# Patient Record
Sex: Male | Born: 1975 | Race: White | Hispanic: No | Marital: Married | Smoking: Former smoker
Health system: Southern US, Community
[De-identification: ages and names within clinical notes are randomized; demographics above are authoritative.]

## PROBLEM LIST (undated history)

## (undated) DIAGNOSIS — E11628 Type 2 diabetes mellitus with other skin complications: Secondary | ICD-10-CM

## (undated) DIAGNOSIS — A419 Sepsis, unspecified organism: Secondary | ICD-10-CM

## (undated) DIAGNOSIS — M869 Osteomyelitis, unspecified: Secondary | ICD-10-CM

## (undated) DIAGNOSIS — K2270 Barrett's esophagus without dysplasia: Secondary | ICD-10-CM

## (undated) DIAGNOSIS — F32A Depression, unspecified: Secondary | ICD-10-CM

## (undated) DIAGNOSIS — M1990 Unspecified osteoarthritis, unspecified site: Secondary | ICD-10-CM

## (undated) DIAGNOSIS — I8290 Acute embolism and thrombosis of unspecified vein: Secondary | ICD-10-CM

## (undated) DIAGNOSIS — N19 Unspecified kidney failure: Secondary | ICD-10-CM

## (undated) DIAGNOSIS — I10 Essential (primary) hypertension: Secondary | ICD-10-CM

## (undated) DIAGNOSIS — E119 Type 2 diabetes mellitus without complications: Secondary | ICD-10-CM

## (undated) DIAGNOSIS — I491 Atrial premature depolarization: Secondary | ICD-10-CM

## (undated) DIAGNOSIS — F419 Anxiety disorder, unspecified: Secondary | ICD-10-CM

## (undated) DIAGNOSIS — E11319 Type 2 diabetes mellitus with unspecified diabetic retinopathy without macular edema: Secondary | ICD-10-CM

## (undated) DIAGNOSIS — E118 Type 2 diabetes mellitus with unspecified complications: Secondary | ICD-10-CM

## (undated) DIAGNOSIS — F329 Major depressive disorder, single episode, unspecified: Secondary | ICD-10-CM

## (undated) DIAGNOSIS — M5020 Other cervical disc displacement, unspecified cervical region: Secondary | ICD-10-CM

## (undated) DIAGNOSIS — K219 Gastro-esophageal reflux disease without esophagitis: Secondary | ICD-10-CM

## (undated) DIAGNOSIS — L089 Local infection of the skin and subcutaneous tissue, unspecified: Secondary | ICD-10-CM

## (undated) DIAGNOSIS — I493 Ventricular premature depolarization: Secondary | ICD-10-CM

## (undated) DIAGNOSIS — G629 Polyneuropathy, unspecified: Secondary | ICD-10-CM

## (undated) DIAGNOSIS — M86672 Other chronic osteomyelitis, left ankle and foot: Principal | ICD-10-CM

## (undated) DIAGNOSIS — Z8719 Personal history of other diseases of the digestive system: Secondary | ICD-10-CM

## (undated) DIAGNOSIS — G4730 Sleep apnea, unspecified: Secondary | ICD-10-CM

## (undated) HISTORY — PX: DEBRIDEMENT LEG: SUR390

## (undated) HISTORY — PX: VITRECTOMY: SHX106

## (undated) HISTORY — DX: Other chronic osteomyelitis, left ankle and foot: M86.672

## (undated) HISTORY — PX: WRIST ARTHROSCOPY: SUR100

## (undated) HISTORY — PX: TOE AMPUTATION: SHX809

## (undated) HISTORY — DX: Type 2 diabetes mellitus with unspecified complications: E11.8

---

## 2013-01-24 ENCOUNTER — Other Ambulatory Visit: Payer: Self-pay | Admitting: Podiatry

## 2013-01-29 ENCOUNTER — Encounter (HOSPITAL_COMMUNITY): Payer: Self-pay | Admitting: Pharmacy Technician

## 2013-02-06 ENCOUNTER — Encounter (HOSPITAL_COMMUNITY)
Admission: RE | Admit: 2013-02-06 | Discharge: 2013-02-06 | Disposition: A | Discharge status: Home / Self Care | Payer: Medicare Other | Source: Ambulatory Visit | Attending: Podiatry | Admitting: Podiatry

## 2013-02-06 ENCOUNTER — Ambulatory Visit (HOSPITAL_COMMUNITY)
Admission: RE | Admit: 2013-02-06 | Discharge: 2013-02-06 | Disposition: A | Discharge status: Home / Self Care | Payer: Medicare Other | Source: Ambulatory Visit | Attending: Podiatry | Admitting: Podiatry

## 2013-02-06 ENCOUNTER — Other Ambulatory Visit: Payer: Self-pay

## 2013-02-06 ENCOUNTER — Encounter (HOSPITAL_COMMUNITY): Payer: Self-pay

## 2013-02-06 HISTORY — DX: Polyneuropathy, unspecified: G62.9

## 2013-02-06 HISTORY — DX: Anxiety disorder, unspecified: F41.9

## 2013-02-06 HISTORY — DX: Barrett's esophagus without dysplasia: K22.70

## 2013-02-06 HISTORY — DX: Unspecified osteoarthritis, unspecified site: M19.90

## 2013-02-06 HISTORY — DX: Personal history of other diseases of the digestive system: Z87.19

## 2013-02-06 HISTORY — DX: Other cervical disc displacement, unspecified cervical region: M50.20

## 2013-02-06 HISTORY — DX: Major depressive disorder, single episode, unspecified: F32.9

## 2013-02-06 HISTORY — DX: Depression, unspecified: F32.A

## 2013-02-06 HISTORY — DX: Type 2 diabetes mellitus without complications: E11.9

## 2013-02-06 HISTORY — DX: Gastro-esophageal reflux disease without esophagitis: K21.9

## 2013-02-06 HISTORY — DX: Atrial premature depolarization: I49.1

## 2013-02-06 HISTORY — DX: Acute embolism and thrombosis of unspecified vein: I82.90

## 2013-02-06 HISTORY — DX: Ventricular premature depolarization: I49.3

## 2013-02-06 LAB — BASIC METABOLIC PANEL
BUN: 19 mg/dL (ref 6–23)
CO2: 31 mEq/L (ref 19–32)
Calcium: 9.3 mg/dL (ref 8.4–10.5)
Chloride: 94 mEq/L — ABNORMAL LOW (ref 96–112)
Creatinine, Ser: 1.72 mg/dL — ABNORMAL HIGH (ref 0.50–1.35)
Glucose, Bld: 336 mg/dL — ABNORMAL HIGH (ref 70–99)

## 2013-02-06 LAB — SURGICAL PCR SCREEN: MRSA, PCR: NEGATIVE

## 2013-02-06 NOTE — Patient Instructions (Addendum)
    Maurice [name]    02/06/2013   Your procedure is scheduled on:  02/14/13  Report to [facility] at  615 AM.  Call this number if you have problems the morning of surgery: [phone]   Remember:   Do not eat food or drink liquids after midnight.   Take these medicines the morning of surgery with A SIP OF WATER: metoprolol,omeprazile,lisinopril,xanax. Take 1/2 of lantus night before surgery.   Do not wear jewelry, make-up or nail polish.  Do not wear lotions, powders, or perfumes.   Do not shave 48 hours prior to surgery. Men may shave face and neck.  Do not bring valuables to the hospital.  Contacts, dentures or bridgework may not be worn into surgery.  Leave suitcase in the car. After surgery it may be brought to your room.  For patients admitted to the hospital, checkout time is 11:00 AM the day of discharge.   Patients discharged the day of surgery will not be allowed to drive  home.  Name and phone number of your driver: family  Special Instructions: Shower using CHG 2 nights before surgery and the night before surgery.  If you shower the day of surgery use CHG.  Use special wash - you have one bottle of CHG for all showers.  You should use approximately 1/3 of the bottle for each shower.   Please read over the following fact sheets that you were given: Pain Booklet, Coughing and Deep Breathing, MRSA Information, Surgical Site Infection Prevention, Anesthesia Post-op Instructions and Care and Recovery After Surgery PATIENT INSTRUCTIONS POST-ANESTHESIA  IMMEDIATELY FOLLOWING SURGERY:  Do not drive or operate machinery for the first twenty four hours after surgery.  Do not make any important decisions for twenty four hours after surgery or while taking narcotic pain medications or sedatives.  If you develop intractable nausea and vomiting or a severe headache please notify your doctor immediately.  FOLLOW-UP:  Please make an appointment with your surgeon as instructed. You do not need to  follow up with anesthesia unless specifically instructed to do so.  WOUND CARE INSTRUCTIONS (if applicable):  Keep a dry clean dressing on the anesthesia/puncture wound site if there is drainage.  Once the wound has quit draining you may leave it open to air.  Generally you should leave the bandage intact for twenty four hours unless there is drainage.  If the epidural site drains for more than 36-48 hours please call the anesthesia department.  QUESTIONS?:  Please feel free to call your physician or the hospital operator if you have any questions, and they will be happy to assist you.

## 2013-02-07 NOTE — Pre-Procedure Instructions (Signed)
Dr Jayme Cloud aware of glucose. Accu check on arrival to be done,no further orders.

## 2013-02-14 ENCOUNTER — Ambulatory Visit (HOSPITAL_COMMUNITY): Payer: Medicare Other | Admitting: Anesthesiology

## 2013-02-14 ENCOUNTER — Ambulatory Visit (HOSPITAL_COMMUNITY)
Admission: RE | Admit: 2013-02-14 | Discharge: 2013-02-14 | Disposition: A | Discharge status: Home / Self Care | Payer: Medicare Other | Source: Ambulatory Visit | Attending: Podiatry | Admitting: Podiatry

## 2013-02-14 ENCOUNTER — Encounter (HOSPITAL_COMMUNITY): Payer: Self-pay | Admitting: Anesthesiology

## 2013-02-14 ENCOUNTER — Ambulatory Visit (HOSPITAL_COMMUNITY): Payer: Medicare Other

## 2013-02-14 ENCOUNTER — Encounter (HOSPITAL_COMMUNITY)
Admission: RE | Disposition: A | Discharge status: Home / Self Care | Payer: Self-pay | Source: Ambulatory Visit | Attending: Podiatry

## 2013-02-14 ENCOUNTER — Encounter (HOSPITAL_COMMUNITY): Payer: Self-pay | Admitting: *Deleted

## 2013-02-14 DIAGNOSIS — E1149 Type 2 diabetes mellitus with other diabetic neurological complication: Secondary | ICD-10-CM | POA: Insufficient documentation

## 2013-02-14 DIAGNOSIS — M19079 Primary osteoarthritis, unspecified ankle and foot: Secondary | ICD-10-CM | POA: Insufficient documentation

## 2013-02-14 DIAGNOSIS — L97509 Non-pressure chronic ulcer of other part of unspecified foot with unspecified severity: Secondary | ICD-10-CM | POA: Insufficient documentation

## 2013-02-14 DIAGNOSIS — E1142 Type 2 diabetes mellitus with diabetic polyneuropathy: Secondary | ICD-10-CM | POA: Insufficient documentation

## 2013-02-14 HISTORY — PX: METATARSAL HEAD EXCISION: SHX5027

## 2013-02-14 LAB — GLUCOSE, CAPILLARY: Glucose-Capillary: 259 mg/dL — ABNORMAL HIGH (ref 70–99)

## 2013-02-14 SURGERY — EXCISION, METATARSAL BONE, HEAD
Anesthesia: Monitor Anesthesia Care | Site: Foot | Laterality: Right | Wound class: Contaminated

## 2013-02-14 MED ORDER — FENTANYL CITRATE 0.05 MG/ML IJ SOLN
INTRAMUSCULAR | Status: DC | PRN
  Administered 2013-02-14 (×3): 25 ug via INTRAVENOUS

## 2013-02-14 MED ORDER — CLINDAMYCIN PHOSPHATE 600 MG/50ML IV SOLN
INTRAVENOUS | Status: AC
  Filled 2013-02-14: qty 50

## 2013-02-14 MED ORDER — FENTANYL CITRATE 0.05 MG/ML IJ SOLN
INTRAMUSCULAR | Status: AC
  Filled 2013-02-14: qty 2

## 2013-02-14 MED ORDER — ONDANSETRON HCL 4 MG/2ML IJ SOLN
INTRAMUSCULAR | Status: AC
  Filled 2013-02-14: qty 2

## 2013-02-14 MED ORDER — FENTANYL CITRATE 0.05 MG/ML IJ SOLN
25.0000 ug | INTRAMUSCULAR | Status: DC | PRN
  Administered 2013-02-14 (×4): 50 ug via INTRAVENOUS

## 2013-02-14 MED ORDER — BUPIVACAINE HCL (PF) 0.5 % IJ SOLN
INTRAMUSCULAR | Status: AC
  Filled 2013-02-14: qty 30

## 2013-02-14 MED ORDER — MIDAZOLAM HCL 2 MG/2ML IJ SOLN
1.0000 mg | INTRAMUSCULAR | Status: DC | PRN
  Administered 2013-02-14 (×2): 2 mg via INTRAVENOUS

## 2013-02-14 MED ORDER — BUPIVACAINE HCL (PF) 0.5 % IJ SOLN
INTRAMUSCULAR | Status: DC | PRN
  Administered 2013-02-14: 17 mL

## 2013-02-14 MED ORDER — LACTATED RINGERS IV SOLN
INTRAVENOUS | Status: DC
  Administered 2013-02-14: 07:00:00 via INTRAVENOUS

## 2013-02-14 MED ORDER — PROPOFOL INFUSION 10 MG/ML OPTIME
INTRAVENOUS | Status: DC | PRN
  Administered 2013-02-14: 08:00:00 via INTRAVENOUS
  Administered 2013-02-14: 125 ug/kg/min via INTRAVENOUS

## 2013-02-14 MED ORDER — MIDAZOLAM HCL 2 MG/2ML IJ SOLN
INTRAMUSCULAR | Status: AC
  Filled 2013-02-14: qty 2

## 2013-02-14 MED ORDER — EPHEDRINE SULFATE 50 MG/ML IJ SOLN
INTRAMUSCULAR | Status: AC
  Filled 2013-02-14: qty 1

## 2013-02-14 MED ORDER — PROPOFOL 10 MG/ML IV EMUL
INTRAVENOUS | Status: AC
  Filled 2013-02-14: qty 20

## 2013-02-14 MED ORDER — ONDANSETRON HCL 4 MG/2ML IJ SOLN: 4.0000 mg | Freq: Once | INTRAMUSCULAR | Status: DC | PRN

## 2013-02-14 MED ORDER — LIDOCAINE HCL (PF) 1 % IJ SOLN
INTRAMUSCULAR | Status: AC
  Filled 2013-02-14: qty 5

## 2013-02-14 MED ORDER — CLINDAMYCIN PHOSPHATE 600 MG/50ML IV SOLN
600.0000 mg | Freq: Once | INTRAVENOUS | Status: AC
  Administered 2013-02-14: 600 mg via INTRAVENOUS

## 2013-02-14 MED ORDER — FENTANYL CITRATE 0.05 MG/ML IJ SOLN
25.0000 ug | INTRAMUSCULAR | Status: DC | PRN
  Administered 2013-02-14: 25 ug via INTRAVENOUS

## 2013-02-14 MED ORDER — 0.9 % SODIUM CHLORIDE (POUR BTL) OPTIME
TOPICAL | Status: DC | PRN
  Administered 2013-02-14: 1000 mL

## 2013-02-14 MED ORDER — MIDAZOLAM HCL 5 MG/5ML IJ SOLN
INTRAMUSCULAR | Status: DC | PRN
  Administered 2013-02-14: 1 mg via INTRAVENOUS

## 2013-02-14 SURGICAL SUPPLY — 52 items
BAG HAMPER (MISCELLANEOUS) ×2 IMPLANT
BANDAGE ELASTIC 4 VELCRO NS (GAUZE/BANDAGES/DRESSINGS) ×2 IMPLANT
BANDAGE ESMARK 4X12 BL STRL LF (DISPOSABLE) ×1 IMPLANT
BANDAGE GAUZE ELAST BULKY 4 IN (GAUZE/BANDAGES/DRESSINGS) ×2 IMPLANT
BENZOIN TINCTURE PRP APPL 2/3 (GAUZE/BANDAGES/DRESSINGS) ×2 IMPLANT
BLADE AVERAGE 25X9 (BLADE) ×4 IMPLANT
BLADE OSC/SAGITTAL MD 9X18.5 (BLADE) IMPLANT
BLADE SURG 15 STRL LF DISP TIS (BLADE) ×3 IMPLANT
BLADE SURG 15 STRL SS (BLADE) ×3
BNDG ESMARK 4X12 BLUE STRL LF (DISPOSABLE) ×2
CHLORAPREP W/TINT 26ML (MISCELLANEOUS) ×2 IMPLANT
CLOTH BEACON ORANGE TIMEOUT ST (SAFETY) ×2 IMPLANT
COVER LIGHT HANDLE STERIS (MISCELLANEOUS) ×4 IMPLANT
CUFF TOURN SGL LL 12 (TOURNIQUET CUFF) IMPLANT
CUFF TOURNIQUET SINGLE 18IN (TOURNIQUET CUFF) ×2 IMPLANT
DECANTER SPIKE VIAL GLASS SM (MISCELLANEOUS) ×2 IMPLANT
DERMAGRAFT 2X3 (Mesh General) ×2 IMPLANT
DRAPE OEC MINIVIEW 54X84 (DRAPES) ×2 IMPLANT
DRSG ADAPTIC 3X8 NADH LF (GAUZE/BANDAGES/DRESSINGS) ×2 IMPLANT
DURA STEPPER LG (CAST SUPPLIES) IMPLANT
DURA STEPPER MED (CAST SUPPLIES) IMPLANT
DURA STEPPER SML (CAST SUPPLIES) IMPLANT
DURA STEPPER XL (SOFTGOODS) IMPLANT
ELECT REM PT RETURN 9FT ADLT (ELECTROSURGICAL) ×2
ELECTRODE REM PT RTRN 9FT ADLT (ELECTROSURGICAL) ×1 IMPLANT
GLOVE BIO SURGEON STRL SZ7.5 (GLOVE) ×2 IMPLANT
GLOVE BIOGEL PI IND STRL 7.0 (GLOVE) ×2 IMPLANT
GLOVE BIOGEL PI INDICATOR 7.0 (GLOVE) ×2
GLOVE ECLIPSE 6.5 STRL STRAW (GLOVE) ×4 IMPLANT
GLOVE EXAM NITRILE MD LF STRL (GLOVE) ×4 IMPLANT
GOWN STRL REIN XL XLG (GOWN DISPOSABLE) ×6 IMPLANT
K-WIRE 229MX1.6 (WIRE) IMPLANT
K-WIRE 6 (WIRE)
KIT ROOM TURNOVER APOR (KITS) ×2 IMPLANT
KWIRE 6 (WIRE) IMPLANT
MANIFOLD NEPTUNE II (INSTRUMENTS) ×2 IMPLANT
MARKER SKIN DUAL TIP RULER LAB (MISCELLANEOUS) ×2 IMPLANT
NEEDLE HYPO 27GX1-1/4 (NEEDLE) ×8 IMPLANT
NS IRRIG 1000ML POUR BTL (IV SOLUTION) ×2 IMPLANT
PACK BASIC LIMB (CUSTOM PROCEDURE TRAY) ×2 IMPLANT
PAD ARMBOARD 7.5X6 YLW CONV (MISCELLANEOUS) ×2 IMPLANT
RASP SM TEAR CROSS CUT (RASP) ×2 IMPLANT
SET BASIN LINEN APH (SET/KITS/TRAYS/PACK) ×2 IMPLANT
SPONGE GAUZE 4X4 12PLY (GAUZE/BANDAGES/DRESSINGS) ×2 IMPLANT
SPONGE LAP 18X18 X RAY DECT (DISPOSABLE) ×2 IMPLANT
STRIP CLOSURE SKIN 1/2X4 (GAUZE/BANDAGES/DRESSINGS) ×2 IMPLANT
SUT BONE WAX W31G (SUTURE) IMPLANT
SUT MON AB 5-0 PS2 18 (SUTURE) IMPLANT
SUT PROLENE 4 0 PS 2 18 (SUTURE) ×2 IMPLANT
SUT VIC AB 4-0 PS2 27 (SUTURE) ×2 IMPLANT
SUT VICRYL AB 3-0 FS1 BRD 27IN (SUTURE) ×2 IMPLANT
SYR CONTROL 10ML LL (SYRINGE) ×4 IMPLANT

## 2013-02-14 NOTE — H&P (Signed)
HISTORY AND PHYSICAL INTERVAL NOTE:  02/14/2013  7:23 AM  Maurice [name]  has presented today for surgery, with the diagnosis of ulceration right foot, dm with peripheral neuropathy .  The various methods of treatment have been discussed with the patient.  No guarantees were given.  After consideration of risks, benefits and other options for treatment, the patient has consented to surgery.  I have reviewed the patients' chart and labs.    Patient Vitals for the past 24 hrs:  BP Temp Temp src Pulse Resp SpO2 Height Weight  02/14/13 0654 125/94 mmHg 98.6 F (37 C) Oral 86 20 98 % 6\' 3"  (1.905 m) 102.967 kg (227 lb)    A history and physical examination was performed in my office.  The patient was reexamined.  There have been no changes to this history and physical examination.  Dallas Schimke, DPM

## 2013-02-14 NOTE — Anesthesia Preprocedure Evaluation (Signed)
Anesthesia Evaluation  Patient identified by MRN, date of birth, ID band Patient awake    Reviewed: Allergy & Precautions, H&P , NPO status , Patient's Chart, lab work & pertinent test results  Airway Mallampati: I TM Distance: >3 FB     Dental  (+) Teeth Intact   Pulmonary neg pulmonary ROS,  breath sounds clear to auscultation        Cardiovascular hypertension, Pt. on medications + dysrhythmias Rhythm:Regular Rate:Normal     Neuro/Psych PSYCHIATRIC DISORDERS Anxiety Depression    GI/Hepatic hiatal hernia, GERD-  ,  Endo/Other  diabetes, Poorly Controlled, Insulin Dependent  Renal/GU      Musculoskeletal   Abdominal   Peds  Hematology   Anesthesia Other Findings   Reproductive/Obstetrics                           Anesthesia Physical Anesthesia Plan  ASA: III  Anesthesia Plan: MAC   Post-op Pain Management:    Induction: Intravenous  Airway Management Planned: Nasal Cannula  Additional Equipment:   Intra-op Plan:   Post-operative Plan:   Informed Consent: I have reviewed the patients History and Physical, chart, labs and discussed the procedure including the risks, benefits and alternatives for the proposed anesthesia with the patient or authorized representative who has indicated his/her understanding and acceptance.     Plan Discussed with:   Anesthesia Plan Comments:         Anesthesia Quick Evaluation

## 2013-02-14 NOTE — Op Note (Signed)
OPERATIVE NOTE  DATE OF PROCEDURE:  02/14/2013  SURGEON:   Dallas Schimke, DPM  OR STAFF:   Circulator: Cyndie Chime, RN Scrub Person: Sherri Sear, CST Circulator Assistant: Lizabeth Leyden, RN RN First Assistant: Eliane Decree Page, RN   PREOPERATIVE DIAGNOSIS:   1.  Ulceration right foot 2.  Diabetes with peripheral neuropathy   POSTOPERATIVE DIAGNOSIS: Same  PROCEDURE: 1.  4th metatarsal head resection right foot 2.  Excisional full thickness debridement of ulceration right foot 3.  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  DESCRIPTION OF THE PROCEDURE:   The patient was brought to the operating room and placed on the operative table in the supine position.  A pneumatic ankle tourniquet was applied to the patient's ankle.  Following sedation, the surgical site was anesthetized with 0.5% Marcaine plain.  The foot was then prepped, scrubbed, and draped in the usual sterile technique.  The foot was elevated, exsanguinated and the pneumatic ankle tourniquet inflated to 250 mmHg.    Attention was directed to the dorsal aspect of the right foot overlying the fourth metatarsal phalangeal joint.  A linear longitudinal incision was made overlying the fourth metatarsal head.  Dissection was continued deep down to the level of the fourth metatarsophalangeal joint.  A linear longitudinal periosteal and capsular incision was performed.  A power saw was used to perform an osteotomy proximal to the surgical neck of the fourth metatarsal bone.   The head of the fourth metatarsal bone was removed and passed from the operative field.  No visible erosive changes were present.  The bone was sent to pathology for evaluation.  The wound was irrigated with copious amounts of sterile irrigant.  The periosteal and capsular structures reapproximated using 3-0 Vicryl.  The subcutaneous structures were reapproximated using 4-0 Vicryl.  The skin was reapproximated using 4-0 Prolene in a horizontal mattress technique.  Attention was directed to the plantar aspect of the fourth metatarsal head region.  A full-thickness ulceration present along the plantar aspect of the right foot.  Surrounding hyperkeratotic tissue was present.  Using a #15 blade an excisional full-thickness debridement of the ulceration of the right foot was performed.  Hyperkeratotic tissue and fibrous tissue from the wound bed was removed.  Following debridement, the ulceration measured 0.9 cm in length by 0.5 cm in width.  Dermagraft was then prepared following packet instructions.  Approximately 50% of the Dermagraft was used.  It was secured with Steri-Strips.  The remaining graft was discarded.  A sterile dressing was then applied to the right foot.  The pneumatic ankle tourniquet was deflated and a prompt hyperemic response was noted to all digits of the operative foot.  The patient tolerated the procedure well.  The patient was then transferred to PACU with vital signs stable and vascular status intact to all toes of the operative foot.  Following a period of postoperative monitoring, the patient will be discharged home.

## 2013-02-14 NOTE — Transfer of Care (Signed)
Immediate Anesthesia Transfer of Care Note  Patient: Maurice [name]  Procedure(s) Performed: Procedure(s) with comments: 4TH METATARSAL HEAD RESECTION RIGHT FOOT (Right) - latex allergy APPLICATION DERMA GRAFT RIGHT FOOT (Right)  Patient Location: PACU  Anesthesia Type:MAC  Level of Consciousness: awake, alert  and oriented  Airway & Oxygen Therapy: Patient Spontanous Breathing  Post-op Assessment: Report given to PACU RN  Post vital signs: Reviewed and stable  Complications: No apparent anesthesia complications

## 2013-02-14 NOTE — Anesthesia Postprocedure Evaluation (Signed)
  Anesthesia Post-op Note  Patient: Maurice [name]  Procedure(s) Performed: Procedure(s) with comments: 4TH METATARSAL HEAD RESECTION RIGHT FOOT (Right) - latex allergy APPLICATION DERMA GRAFT RIGHT FOOT (Right)  Patient Location: PACU  Anesthesia Type:MAC  Level of Consciousness: awake, alert  and oriented  Airway and Oxygen Therapy: Patient Spontanous Breathing  Post-op Pain: none  Post-op Assessment: Post-op Vital signs reviewed, Patient's Cardiovascular Status Stable, Respiratory Function Stable, Patent Airway and No signs of Nausea or vomiting  Post-op Vital Signs: Reviewed and stable  Complications: No apparent anesthesia complications

## 2013-02-18 ENCOUNTER — Encounter (HOSPITAL_COMMUNITY): Payer: Self-pay | Admitting: Podiatry

## 2015-06-15 ENCOUNTER — Encounter (HOSPITAL_COMMUNITY): Payer: Self-pay

## 2015-06-15 ENCOUNTER — Other Ambulatory Visit: Payer: Self-pay | Admitting: Podiatry

## 2015-06-15 ENCOUNTER — Ambulatory Visit (HOSPITAL_COMMUNITY)
Admission: RE | Admit: 2015-06-15 | Discharge: 2015-06-15 | Disposition: A | Discharge status: Home / Self Care | Payer: Medicare Other | Source: Ambulatory Visit | Attending: Podiatry | Admitting: Podiatry

## 2015-06-15 ENCOUNTER — Other Ambulatory Visit: Payer: Self-pay

## 2015-06-15 ENCOUNTER — Encounter (HOSPITAL_COMMUNITY)
Admission: RE | Admit: 2015-06-15 | Discharge: 2015-06-15 | Disposition: A | Discharge status: Home / Self Care | Payer: Medicare Other | Source: Ambulatory Visit | Attending: Podiatry | Admitting: Podiatry

## 2015-06-15 DIAGNOSIS — L97509 Non-pressure chronic ulcer of other part of unspecified foot with unspecified severity: Principal | ICD-10-CM

## 2015-06-15 DIAGNOSIS — Z89421 Acquired absence of other right toe(s): Secondary | ICD-10-CM

## 2015-06-15 DIAGNOSIS — Z01812 Encounter for preprocedural laboratory examination: Secondary | ICD-10-CM

## 2015-06-15 DIAGNOSIS — E11621 Type 2 diabetes mellitus with foot ulcer: Secondary | ICD-10-CM | POA: Insufficient documentation

## 2015-06-15 DIAGNOSIS — A419 Sepsis, unspecified organism: Secondary | ICD-10-CM | POA: Diagnosis not present

## 2015-06-15 DIAGNOSIS — Z01818 Encounter for other preprocedural examination: Secondary | ICD-10-CM

## 2015-06-15 DIAGNOSIS — R609 Edema, unspecified: Secondary | ICD-10-CM | POA: Diagnosis not present

## 2015-06-15 HISTORY — DX: Type 2 diabetes mellitus with unspecified diabetic retinopathy without macular edema: E11.319

## 2015-06-15 HISTORY — DX: Unspecified kidney failure: N19

## 2015-06-15 HISTORY — DX: Sleep apnea, unspecified: G47.30

## 2015-06-15 HISTORY — DX: Essential (primary) hypertension: I10

## 2015-06-15 LAB — CBC WITH DIFFERENTIAL/PLATELET
BASOS ABS: 0.1 10*3/uL (ref 0.0–0.1)
Basophils Relative: 1 % (ref 0–1)
EOS PCT: 2 % (ref 0–5)
Eosinophils Absolute: 0.4 10*3/uL (ref 0.0–0.7)
HCT: 28.8 % — ABNORMAL LOW (ref 39.0–52.0)
HEMOGLOBIN: 9.3 g/dL — AB (ref 13.0–17.0)
Lymphocytes Relative: 12 % (ref 12–46)
Lymphs Abs: 2.1 10*3/uL (ref 0.7–4.0)
MCH: 28.3 pg (ref 26.0–34.0)
MCHC: 32.3 g/dL (ref 30.0–36.0)
MCV: 87.5 fL (ref 78.0–100.0)
MONO ABS: 1.1 10*3/uL — AB (ref 0.1–1.0)
MONOS PCT: 6 % (ref 3–12)
NEUTROS ABS: 14.1 10*3/uL — AB (ref 1.7–7.7)
NEUTROS PCT: 79 % — AB (ref 43–77)
Platelets: 481 10*3/uL — ABNORMAL HIGH (ref 150–400)
RBC: 3.29 MIL/uL — ABNORMAL LOW (ref 4.22–5.81)
RDW: 13.6 % (ref 11.5–15.5)
WBC: 17.7 10*3/uL — ABNORMAL HIGH (ref 4.0–10.5)

## 2015-06-15 LAB — COMPREHENSIVE METABOLIC PANEL
ALBUMIN: 2 g/dL — AB (ref 3.5–5.0)
ALT: 20 U/L (ref 17–63)
AST: 21 U/L (ref 15–41)
Alkaline Phosphatase: 123 U/L (ref 38–126)
Anion gap: 10 (ref 5–15)
BUN: 53 mg/dL — ABNORMAL HIGH (ref 6–20)
CALCIUM: 8.4 mg/dL — AB (ref 8.9–10.3)
CO2: 24 mmol/L (ref 22–32)
Chloride: 99 mmol/L — ABNORMAL LOW (ref 101–111)
Creatinine, Ser: 4.26 mg/dL — ABNORMAL HIGH (ref 0.61–1.24)
GFR calc Af Amer: 19 mL/min — ABNORMAL LOW (ref >60)
GFR calc non Af Amer: 16 mL/min — ABNORMAL LOW (ref >60)
Glucose, Bld: 306 mg/dL — ABNORMAL HIGH (ref 65–99)
Potassium: 5.1 mmol/L (ref 3.5–5.1)
SODIUM: 133 mmol/L — AB (ref 135–145)
TOTAL PROTEIN: 7.1 g/dL (ref 6.5–8.1)
Total Bilirubin: 0.4 mg/dL (ref 0.3–1.2)

## 2015-06-15 LAB — SEDIMENTATION RATE: SED RATE: 131 mm/h — AB (ref 0–16)

## 2015-06-15 NOTE — Pre-Procedure Instructions (Signed)
Patient given information to sign up for my chart at home. 

## 2015-06-15 NOTE — Patient Instructions (Signed)
Olga CoasterJohn S Hogsett  06/15/2015     @[email or web address] @   Your procedure is scheduled on  06/17/2015  Report to [facility] at  720   A.M.  Call this number if you have problems the morning of surgery:  [phone]   Remember:  Do not eat food or drink liquids after midnight.  Take these medicines the morning of surgery with A SIP OF WATER  Xanax, flexaril, lisinopril, metoprolol, prilosec, oxycodone. Take 1/2 of your usual insulin dosage the night before your surgery. DO NOT take ANY diabetic medicines the morning of your surgery.   Do not wear jewelry, make-up or nail polish.  Do not wear lotions, powders, or perfumes.    Do not shave 48 hours prior to surgery.  Men may shave face and neck.  Do not bring valuables to the hospital.  [facility] is not responsible for any belongings or valuables.  Contacts, dentures or bridgework may not be worn into surgery.  Leave your suitcase in the car.  After surgery it may be brought to your room.  For patients admitted to the hospital, discharge time will be determined by your treatment team.  Patients discharged the day of surgery will not be allowed to drive home.   Name and phone number of your driver:   family Special instructions:  none  Please read over the following fact sheets that you were given. Pain Booklet, Coughing and Deep Breathing, Surgical Site Infection Prevention, Anesthesia Post-op Instructions and Care and Recovery After Surgery      Wound Debridement Wound debridement is a procedure used to remove dead tissue and contaminated substances from a wound. A wound must be clean to heal. It also must get a good supply of blood. Anything that is stopping this must be taken out of the wound. This could be dead tissue, scar tissue, fluid buildup, or debris from outside of the body. Wounds that are not cleaned by debridement heal slowly or not at all. They can become infected. Any infection in the tissue can also spread to  nearby areas or to other parts of the body through the blood. Wound debridement can be done through a surgical procedure or various other methods. Debridement is sometimes done to get a sample of tissue from the wound. The tissue can be checked under a microscope or sent to a lab for testing.  LET YOUR CAREGIVER KNOW ABOUT:   Any allergies you have.  All medicines you are taking, including vitamins, steroids, herbs, eyedrops, and over-the-counter medicines and creams.   Previous problems you or members of your family have had with the use of anesthetics.   Any blood disorders you have had.   Previous surgeries you have had.   Other health problems you have.  RISKS AND COMPLICATIONS  Generally, wound debridement is a safe procedure. However, as with any medical procedure, complications can occur. Possible complications include:  Bleeding that does not stop.   Infection.   Damage to nerves, blood vessels, or healthy tissue inside the wound.   Pain.   Lack of healing. BEFORE THE PROCEDURE   The caregiver will check the wound for signs of healing or infection. A measurement of the wound will be taken, including how deep it is. A metal tool (probe) may be used. Blood tests may be done to check for infection.   If you will be given medicine to make you sleep through the procedure (general anesthetic), do not eat  or drink anything for at least 6 hours before the procedure. Ask your caregiver if it is okay to have a sip of water with any needed medicine.   Make plans to have someone drive you home after the procedure. Also, make sure someone can stay with you for a few days.  PROCEDURE  The following methods may be used alone or in combination. Surgical debridement:  Small monitors may be placed on your body. They are used to check your heart, blood pressure, and oxygen level.   You may be given medicine through an intravenous (IV) access tube in your hand or arm.    You might be given medicine to help you relax (sedative).   You may be given medicine to numb the area around the wound (local anesthetic). If the wound is deep or wide, you may be given general anesthetic to make you sleep through the procedure.   Once you are asleep or the wound area is numb, the wound may be washed with a sterile saltwater solution.   Scissors, surgical knives (scalpels), and surgical tweezers (forceps) will be used to remove dead or dying tissue. Any other material that should not be in the wound will also be taken out.   After the tissue and other material have been removed from the wound, the wound will be washed again.   A bandage (dressing) may be placed over the wound.  Mechanical debridement: Mechanical debridement may involve various techniques:  A dressing may be used to pull off dead tissue. A moist dressing is placed over the wound. It is left in place until it is dry. When the dressing is lifted off, this lifts away the dead tissue.   Whirlpool baths may be used to flush the wound with forceful streams of hot water.   The wound may be flushed with sterile solution.  Surgical instruments that use water under high pressure may be used to clean the wound. Chemical debridement:  A chemical medicine is put on the wound. The aim is to dissolve dead or dying tissue. Ointments may also be used. Autolytic debridement:  A special dressing is used to trap moisture inside the wound. The goal is for the wound to heal naturally under the dressing. Healing takes longer with this treatment. AFTER THE PROCEDURE   If a local anesthetic is used, you will be allowed to go home as soon as you are ready. If a general anesthetic is used, you will be taken to a recovery area until you are stable. Your blood pressure and pulse will be checked often. You may continue to get fluids through the IV tube for a while. Once you are stable, you may be able to go home, or  you may need to stay in the hospital overnight.Your caregiver will decide when you can go home.   You may feel some pain. You will likely be given medicine for pain.  Before you go home, make sure you know how to care for the wound. This includes knowing when the dressing should be changed and how to change it.   Set up a follow-up appointment before leaving. Document Released: 02/08/2010 Document Revised: 10/31/2012 Document Reviewed: 08/01/2012 [facility] Patient Information 2015 [place], [place]. This information is not intended to replace advice given to you by your health care provider. Make sure you discuss any questions you have with your health care provider. PATIENT INSTRUCTIONS POST-ANESTHESIA  IMMEDIATELY FOLLOWING SURGERY:  Do not drive or operate machinery for the first  twenty four hours after surgery.  Do not make any important decisions for twenty four hours after surgery or while taking narcotic pain medications or sedatives.  If you develop intractable nausea and vomiting or a severe headache please notify your doctor immediately.  FOLLOW-UP:  Please make an appointment with your surgeon as instructed. You do not need to follow up with anesthesia unless specifically instructed to do so.  WOUND CARE INSTRUCTIONS (if applicable):  Keep a dry clean dressing on the anesthesia/puncture wound site if there is drainage.  Once the wound has quit draining you may leave it open to air.  Generally you should leave the bandage intact for twenty four hours unless there is drainage.  If the epidural site drains for more than 36-48 hours please call the anesthesia department.  QUESTIONS?:  Please feel free to call your physician or the hospital operator if you have any questions, and they will be happy to assist you.

## 2015-06-16 ENCOUNTER — Emergency Department (HOSPITAL_COMMUNITY): Payer: Medicare Other

## 2015-06-16 ENCOUNTER — Observation Stay (HOSPITAL_COMMUNITY): Payer: Medicare Other

## 2015-06-16 ENCOUNTER — Encounter (HOSPITAL_COMMUNITY): Payer: Self-pay | Admitting: Emergency Medicine

## 2015-06-16 ENCOUNTER — Inpatient Hospital Stay (HOSPITAL_COMMUNITY)
Admission: EM | Admit: 2015-06-16 | Discharge: 2015-06-20 | DRG: 854 | Disposition: A | Discharge status: Home / Self Care | Payer: Medicare Other | Attending: Internal Medicine | Admitting: Internal Medicine

## 2015-06-16 DIAGNOSIS — E11319 Type 2 diabetes mellitus with unspecified diabetic retinopathy without macular edema: Secondary | ICD-10-CM | POA: Diagnosis present

## 2015-06-16 DIAGNOSIS — E872 Acidosis: Secondary | ICD-10-CM | POA: Diagnosis present

## 2015-06-16 DIAGNOSIS — D631 Anemia in chronic kidney disease: Secondary | ICD-10-CM

## 2015-06-16 DIAGNOSIS — N184 Chronic kidney disease, stage 4 (severe): Secondary | ICD-10-CM

## 2015-06-16 DIAGNOSIS — R609 Edema, unspecified: Secondary | ICD-10-CM

## 2015-06-16 DIAGNOSIS — E1121 Type 2 diabetes mellitus with diabetic nephropathy: Secondary | ICD-10-CM | POA: Diagnosis present

## 2015-06-16 DIAGNOSIS — Z79891 Long term (current) use of opiate analgesic: Secondary | ICD-10-CM

## 2015-06-16 DIAGNOSIS — E861 Hypovolemia: Secondary | ICD-10-CM | POA: Diagnosis present

## 2015-06-16 DIAGNOSIS — D638 Anemia in other chronic diseases classified elsewhere: Secondary | ICD-10-CM | POA: Diagnosis present

## 2015-06-16 DIAGNOSIS — D509 Iron deficiency anemia, unspecified: Secondary | ICD-10-CM | POA: Diagnosis present

## 2015-06-16 DIAGNOSIS — L089 Local infection of the skin and subcutaneous tissue, unspecified: Secondary | ICD-10-CM | POA: Diagnosis not present

## 2015-06-16 DIAGNOSIS — Z794 Long term (current) use of insulin: Secondary | ICD-10-CM

## 2015-06-16 DIAGNOSIS — A419 Sepsis, unspecified organism: Principal | ICD-10-CM | POA: Diagnosis present

## 2015-06-16 DIAGNOSIS — N185 Chronic kidney disease, stage 5: Secondary | ICD-10-CM | POA: Diagnosis present

## 2015-06-16 DIAGNOSIS — L97519 Non-pressure chronic ulcer of other part of right foot with unspecified severity: Secondary | ICD-10-CM | POA: Diagnosis present

## 2015-06-16 DIAGNOSIS — N170 Acute kidney failure with tubular necrosis: Secondary | ICD-10-CM

## 2015-06-16 DIAGNOSIS — Z881 Allergy status to other antibiotic agents status: Secondary | ICD-10-CM

## 2015-06-16 DIAGNOSIS — E1151 Type 2 diabetes mellitus with diabetic peripheral angiopathy without gangrene: Secondary | ICD-10-CM | POA: Diagnosis present

## 2015-06-16 DIAGNOSIS — Z9889 Other specified postprocedural states: Secondary | ICD-10-CM

## 2015-06-16 DIAGNOSIS — I10 Essential (primary) hypertension: Secondary | ICD-10-CM

## 2015-06-16 DIAGNOSIS — N186 End stage renal disease: Secondary | ICD-10-CM

## 2015-06-16 DIAGNOSIS — Z833 Family history of diabetes mellitus: Secondary | ICD-10-CM

## 2015-06-16 DIAGNOSIS — G894 Chronic pain syndrome: Secondary | ICD-10-CM | POA: Diagnosis present

## 2015-06-16 DIAGNOSIS — R600 Localized edema: Secondary | ICD-10-CM | POA: Diagnosis present

## 2015-06-16 DIAGNOSIS — IMO0002 Reserved for concepts with insufficient information to code with codable children: Secondary | ICD-10-CM | POA: Diagnosis present

## 2015-06-16 DIAGNOSIS — Z91040 Latex allergy status: Secondary | ICD-10-CM

## 2015-06-16 DIAGNOSIS — M869 Osteomyelitis, unspecified: Secondary | ICD-10-CM | POA: Diagnosis present

## 2015-06-16 DIAGNOSIS — I120 Hypertensive chronic kidney disease with stage 5 chronic kidney disease or end stage renal disease: Secondary | ICD-10-CM | POA: Diagnosis present

## 2015-06-16 DIAGNOSIS — R7881 Bacteremia: Secondary | ICD-10-CM | POA: Diagnosis present

## 2015-06-16 DIAGNOSIS — F418 Other specified anxiety disorders: Secondary | ICD-10-CM | POA: Diagnosis present

## 2015-06-16 DIAGNOSIS — E871 Hypo-osmolality and hyponatremia: Secondary | ICD-10-CM | POA: Diagnosis present

## 2015-06-16 DIAGNOSIS — N189 Chronic kidney disease, unspecified: Secondary | ICD-10-CM | POA: Diagnosis not present

## 2015-06-16 DIAGNOSIS — E1142 Type 2 diabetes mellitus with diabetic polyneuropathy: Secondary | ICD-10-CM | POA: Diagnosis present

## 2015-06-16 DIAGNOSIS — K219 Gastro-esophageal reflux disease without esophagitis: Secondary | ICD-10-CM | POA: Diagnosis present

## 2015-06-16 DIAGNOSIS — E1165 Type 2 diabetes mellitus with hyperglycemia: Secondary | ICD-10-CM

## 2015-06-16 DIAGNOSIS — E11621 Type 2 diabetes mellitus with foot ulcer: Secondary | ICD-10-CM | POA: Diagnosis present

## 2015-06-16 DIAGNOSIS — E1122 Type 2 diabetes mellitus with diabetic chronic kidney disease: Secondary | ICD-10-CM | POA: Diagnosis present

## 2015-06-16 DIAGNOSIS — E1169 Type 2 diabetes mellitus with other specified complication: Secondary | ICD-10-CM

## 2015-06-16 DIAGNOSIS — E11628 Type 2 diabetes mellitus with other skin complications: Secondary | ICD-10-CM | POA: Diagnosis present

## 2015-06-16 DIAGNOSIS — L03115 Cellulitis of right lower limb: Secondary | ICD-10-CM | POA: Diagnosis present

## 2015-06-16 DIAGNOSIS — E118 Type 2 diabetes mellitus with unspecified complications: Secondary | ICD-10-CM

## 2015-06-16 DIAGNOSIS — N179 Acute kidney failure, unspecified: Secondary | ICD-10-CM | POA: Diagnosis present

## 2015-06-16 DIAGNOSIS — F17210 Nicotine dependence, cigarettes, uncomplicated: Secondary | ICD-10-CM | POA: Diagnosis present

## 2015-06-16 HISTORY — DX: Type 2 diabetes mellitus with other skin complications: E11.628

## 2015-06-16 HISTORY — DX: Local infection of the skin and subcutaneous tissue, unspecified: L08.9

## 2015-06-16 HISTORY — DX: Osteomyelitis, unspecified: M86.9

## 2015-06-16 HISTORY — DX: Type 2 diabetes mellitus with unspecified complications: E11.8

## 2015-06-16 LAB — COMPREHENSIVE METABOLIC PANEL
ALBUMIN: 2 g/dL — AB (ref 3.5–5.0)
ALT: 20 U/L (ref 17–63)
AST: 18 U/L (ref 15–41)
Alkaline Phosphatase: 112 U/L (ref 38–126)
Anion gap: 10 (ref 5–15)
BUN: 50 mg/dL — ABNORMAL HIGH (ref 6–20)
CO2: 23 mmol/L (ref 22–32)
CREATININE: 4.48 mg/dL — AB (ref 0.61–1.24)
Calcium: 8.2 mg/dL — ABNORMAL LOW (ref 8.9–10.3)
Chloride: 94 mmol/L — ABNORMAL LOW (ref 101–111)
GFR, EST AFRICAN AMERICAN: 17 mL/min — AB (ref >60)
GFR, EST NON AFRICAN AMERICAN: 15 mL/min — AB (ref >60)
Glucose, Bld: 295 mg/dL — ABNORMAL HIGH (ref 65–99)
Potassium: 4.4 mmol/L (ref 3.5–5.1)
Sodium: 127 mmol/L — ABNORMAL LOW (ref 135–145)
Total Bilirubin: 0.4 mg/dL (ref 0.3–1.2)
Total Protein: 7.3 g/dL (ref 6.5–8.1)

## 2015-06-16 LAB — VITAMIN B12: VITAMIN B 12: 520 pg/mL (ref 180–914)

## 2015-06-16 LAB — LACTIC ACID, PLASMA
LACTIC ACID, VENOUS: 0.9 mmol/L (ref 0.5–2.0)
Lactic Acid, Venous: 0.7 mmol/L (ref 0.5–2.0)

## 2015-06-16 LAB — RETICULOCYTES
RBC.: 2.94 MIL/uL — AB (ref 4.22–5.81)
RETIC CT PCT: 0.7 % (ref 0.4–3.1)
Retic Count, Absolute: 20.6 10*3/uL (ref 19.0–186.0)

## 2015-06-16 LAB — CBC
HCT: 24.9 % — ABNORMAL LOW (ref 39.0–52.0)
Hemoglobin: 8.6 g/dL — ABNORMAL LOW (ref 13.0–17.0)
MCH: 29.3 pg (ref 26.0–34.0)
MCHC: 34.5 g/dL (ref 30.0–36.0)
MCV: 84.7 fL (ref 78.0–100.0)
Platelets: 369 10*3/uL (ref 150–400)
RBC: 2.94 MIL/uL — ABNORMAL LOW (ref 4.22–5.81)
RDW: 13.3 % (ref 11.5–15.5)
WBC: 22.7 10*3/uL — ABNORMAL HIGH (ref 4.0–10.5)

## 2015-06-16 LAB — APTT: aPTT: 40 seconds — ABNORMAL HIGH (ref 24–37)

## 2015-06-16 LAB — IRON AND TIBC
IRON: 6 ug/dL — AB (ref 45–182)
SATURATION RATIOS: 4 % — AB (ref 17.9–39.5)
TIBC: 143 ug/dL — ABNORMAL LOW (ref 250–450)
UIBC: 137 ug/dL

## 2015-06-16 LAB — GLUCOSE, CAPILLARY
GLUCOSE-CAPILLARY: 209 mg/dL — AB (ref 65–99)
GLUCOSE-CAPILLARY: 186 mg/dL — AB (ref 65–99)

## 2015-06-16 LAB — C-REACTIVE PROTEIN: CRP: 20.7 mg/dL — ABNORMAL HIGH (ref <1.0)

## 2015-06-16 LAB — PROTIME-INR
INR: 1.18 (ref 0.00–1.49)
Prothrombin Time: 15.2 seconds (ref 11.6–15.2)

## 2015-06-16 LAB — FOLATE: Folate: 25 ng/mL (ref >5.9)

## 2015-06-16 LAB — FERRITIN: Ferritin: 236 ng/mL (ref 24–336)

## 2015-06-16 LAB — I-STAT CG4 LACTIC ACID, ED: LACTIC ACID, VENOUS: 0.78 mmol/L (ref 0.5–2.0)

## 2015-06-16 MED ORDER — METOPROLOL TARTRATE 50 MG PO TABS
50.0000 mg | ORAL_TABLET | Freq: Two times a day (BID) | ORAL | Status: DC
  Administered 2015-06-16 – 2015-06-20 (×7): 50 mg via ORAL
  Filled 2015-06-16 (×7): qty 1

## 2015-06-16 MED ORDER — SODIUM CHLORIDE 0.9 % IJ SOLN: 3.0000 mL | Freq: Two times a day (BID) | INTRAMUSCULAR | Status: DC

## 2015-06-16 MED ORDER — SODIUM CHLORIDE 0.9 % IV SOLN
INTRAVENOUS | Status: DC
  Administered 2015-06-16 – 2015-06-19 (×6): via INTRAVENOUS

## 2015-06-16 MED ORDER — ACETAMINOPHEN 650 MG RE SUPP: 650.0000 mg | Freq: Four times a day (QID) | RECTAL | Status: DC | PRN

## 2015-06-16 MED ORDER — SODIUM CHLORIDE 0.9 % IV BOLUS (SEPSIS)
500.0000 mL | INTRAVENOUS | Status: AC
  Administered 2015-06-16: 500 mL via INTRAVENOUS

## 2015-06-16 MED ORDER — CLINDAMYCIN PHOSPHATE 600 MG/50ML IV SOLN
600.0000 mg | Freq: Three times a day (TID) | INTRAVENOUS | Status: DC
  Administered 2015-06-16 – 2015-06-20 (×11): 600 mg via INTRAVENOUS
  Filled 2015-06-16 (×18): qty 50

## 2015-06-16 MED ORDER — FUROSEMIDE 20 MG PO TABS
20.0000 mg | ORAL_TABLET | Freq: Every day | ORAL | Status: DC
  Administered 2015-06-17 – 2015-06-20 (×3): 20 mg via ORAL
  Filled 2015-06-16 (×3): qty 1

## 2015-06-16 MED ORDER — INSULIN ASPART 100 UNIT/ML ~~LOC~~ SOLN: 0.0000 [IU] | Freq: Every day | SUBCUTANEOUS | Status: DC

## 2015-06-16 MED ORDER — ONDANSETRON HCL 4 MG/2ML IJ SOLN: 4.0000 mg | Freq: Three times a day (TID) | INTRAMUSCULAR | Status: AC | PRN

## 2015-06-16 MED ORDER — OXYCODONE HCL 5 MG PO TABS
30.0000 mg | ORAL_TABLET | Freq: Three times a day (TID) | ORAL | Status: DC | PRN
  Administered 2015-06-16 – 2015-06-20 (×8): 30 mg via ORAL
  Filled 2015-06-16 (×8): qty 6

## 2015-06-16 MED ORDER — SODIUM CHLORIDE 0.9 % IV BOLUS (SEPSIS)
1000.0000 mL | INTRAVENOUS | Status: AC
  Administered 2015-06-16 (×3): 1000 mL via INTRAVENOUS

## 2015-06-16 MED ORDER — MORPHINE SULFATE 4 MG/ML IJ SOLN
4.0000 mg | INTRAMUSCULAR | Status: DC | PRN
  Administered 2015-06-19 – 2015-06-20 (×2): 4 mg via INTRAVENOUS
  Filled 2015-06-16 (×2): qty 1

## 2015-06-16 MED ORDER — PANTOPRAZOLE SODIUM 40 MG PO TBEC
80.0000 mg | DELAYED_RELEASE_TABLET | Freq: Every day | ORAL | Status: DC
  Administered 2015-06-17 – 2015-06-20 (×3): 80 mg via ORAL
  Filled 2015-06-16 (×3): qty 2

## 2015-06-16 MED ORDER — ALPRAZOLAM 0.5 MG PO TABS
0.5000 mg | ORAL_TABLET | Freq: Three times a day (TID) | ORAL | Status: DC
  Administered 2015-06-16 – 2015-06-20 (×11): 0.5 mg via ORAL
  Filled 2015-06-16 (×11): qty 1

## 2015-06-16 MED ORDER — ACETAMINOPHEN 325 MG PO TABS
650.0000 mg | ORAL_TABLET | Freq: Four times a day (QID) | ORAL | Status: DC | PRN
  Administered 2015-06-16 – 2015-06-17 (×4): 650 mg via ORAL
  Filled 2015-06-16 (×4): qty 2

## 2015-06-16 MED ORDER — VILAZODONE HCL 20 MG PO TABS: 20.0000 mg | ORAL_TABLET | Freq: Every day | ORAL | Status: DC

## 2015-06-16 MED ORDER — INSULIN ASPART 100 UNIT/ML ~~LOC~~ SOLN
0.0000 [IU] | Freq: Three times a day (TID) | SUBCUTANEOUS | Status: DC
  Administered 2015-06-16: 5 [IU] via SUBCUTANEOUS
  Administered 2015-06-17: 3 [IU] via SUBCUTANEOUS

## 2015-06-16 MED ORDER — CYCLOBENZAPRINE HCL 10 MG PO TABS
10.0000 mg | ORAL_TABLET | Freq: Two times a day (BID) | ORAL | Status: DC
  Administered 2015-06-16 – 2015-06-20 (×7): 10 mg via ORAL
  Filled 2015-06-16 (×7): qty 1

## 2015-06-16 MED ORDER — CLINDAMYCIN PHOSPHATE 900 MG/50ML IV SOLN
900.0000 mg | Freq: Once | INTRAVENOUS | Status: AC
  Administered 2015-06-16: 900 mg via INTRAVENOUS
  Filled 2015-06-16: qty 50

## 2015-06-16 MED ORDER — HEPARIN SODIUM (PORCINE) 5000 UNIT/ML IJ SOLN
5000.0000 [IU] | Freq: Three times a day (TID) | INTRAMUSCULAR | Status: DC
  Administered 2015-06-16 – 2015-06-18 (×6): 5000 [IU] via SUBCUTANEOUS
  Filled 2015-06-16 (×6): qty 1

## 2015-06-16 NOTE — Consult Note (Signed)
Podiatry Consult Note  Reason for Consultation:  Nonhealing diabetic foot ulceration  History of Present Illness: Maurice CoasterJohn S [name] is a 39 y.o. male admitted today for an infected diabetic ulceration of his right foot.  A summary of his course is as follows:  The patient was seen on March 09, 2015 for at risk diabetic foot care.  No ulceration was present at that visit.  He notified the office on Mar 31, 2015 complaining of changes of his right foot.  A full-thickness ulceration was encountered.  A culture was performed which revealed MSSA and Strep agalactiae.  He was placed on doxycycline.  Plain films were performed.  No erosive changes were noted.  Wound care was initiated.   all an MRI was scheduled.  An MRI of the right foot was performed on Apr 01, 2015 revealing extensive soft tissue cellulitis without evidence of osteomyelitis.  He was seen on 04/06/2015, 04/13/2015 and 05/04/2015.  The wound appeared to be improving clinically.  He was seen 05/07/2015 after noticing changes along the proximal aspect of the ulceration.  No acute signs of infection were present.  He was advised to continue local wound care.  He was seen on 05/21/2015, 06/04/2015 and 06/08/2015.  At his 06/08/2015 there was periwound rubor with increase in the skin temperature of his right foot.  A culture was performed which revealed MSSA and Strep agalactiae.  He was placed on doxycycline.  He was transitioned from Aquacel to EnsignSantyl.  He contacted the office on 06/12/2015 requesting an appointment because of changes in the appearance of his foot.  The wound was debrided.  An additional culture was performed.  He was advised to complete his course of doxycycline.  Surgical debridement of the ulceration with the Wound VAC placement was planned.  I received his labs on 06/16/2015 and recommended that he be admitted.   Past Medical History  Diagnosis Date  . PVC (premature ventricular contraction)   . PAC (premature atrial contraction)    . Diabetes mellitus without complication   . Anxiety   . Depression   . Blood clot in vein     pt sts" it may have been in artery and not vein" of right leg  . GERD (gastroesophageal reflux disease)   . H/O hiatal hernia   . Barrett's esophagus   . Arthritis   . Neuropathy   . Herniated disc, cervical     c5 and c6 and c6 and c7  . Hypertension   . Kidney failure     stage 4 just dx 4 months ago.Sees Dr Elby BeckFlorencio Garcia, [place] in [place] [phone].  .  . 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 Diabetic retinopathy of both eyes, associated with type 2 diabetes mellitus    Scheduled Meds: . ALPRAZolam  0.5 mg Oral TID  . clindamycin (CLEOCIN) IV  600 mg Intravenous 3 times per day  . cyclobenzaprine  10 mg Oral BID  . [START ON 06/17/2015] furosemide  20 mg Oral Daily  . heparin  5,000 Units Subcutaneous 3 times per day  . insulin aspart  0-20 Units Subcutaneous TID WC  . insulin aspart  0-5 Units Subcutaneous QHS  . metoprolol  50 mg Oral BID  . pantoprazole  80 mg Oral Daily  . sodium chloride  1,000 mL Intravenous Q1H   Followed by  . sodium chloride  500 mL Intravenous Q1H  . sodium chloride  3 mL  Intravenous Q12H  . [START ON 06/17/2015] Vilazodone HCl  20 mg Oral Daily   Continuous Infusions: . sodium chloride     PRN Meds:.acetaminophen **OR** acetaminophen, morphine injection, ondansetron (ZOFRAN) IV  Allergies  Allergen Reactions  . Daptomycin     Hypotension   . Bactrim [Sulfamethoxazole-Trimethoprim] Other (See Comments)    Cannot take due to kidney issue  . Dapsone Nausea And Vomiting  . Latex Other (See Comments)    Blisters.   . Penicillins Hives  . Vancomycin Itching   Past Surgical History  Procedure Laterality Date  . Wrist arthroscopy      left with no break reapir of tendond and ligamants  . Toe amputation      5th digit right foot-[place]  . Debridement leg      lower leg-left  . Metatarsal head  excision Right 02/14/2013    Procedure: 4TH METATARSAL HEAD RESECTION RIGHT FOOT;  Surgeon: Dallas Schimke, DPM;  Location: [facility];  Service: Orthopedics;  Laterality: Right;  latex allergy  . Vitrectomy Right    Family History  Problem Relation Age of Onset  . Cancer Mother     liver lung  . Cancer Father     liver  . Diabetes Mother   . Diabetes Father    Social History:  reports that he has been smoking Cigarettes.  He has a 5 pack-year smoking history. His smokeless tobacco use includes Chew. He reports that he drinks alcohol. He reports that he does not use illicit drugs.  Review of Systems: + fever, + chills, + nausea, - vomiting, + nonhealing ulceration of the right foot  Physical Examination: Vital signs in last 24 hours:   Temp:  [100 F (37.8 C)-101 F (38.3 C)] 100 F (37.8 C) (07/19 1627) Pulse Rate:  [100-110] 102 (07/19 1657) Resp:  [14-20] 14 (07/19 1657) BP: (127-143)/(61-85) 143/84 mmHg (07/19 1657) SpO2:  [98 %-100 %] 98 % (07/19 1657) Weight:  [244 lb (110.678 kg)] 244 lb (110.678 kg) (07/19 1419)  DRESSING:  Clean, dry and intact. INTEGUMENT:  Skin of the right foot is warm.  There is a full-thickness ulceration present along the plantar lateral aspect of the right foot.  The ulceration measured 6.6 cm in length by 6.0 by 0.5 cm in depth. The wound bed is comprised of a rim of granular tissue.  The central aspect of the ulceration is comprised of fibrotic and granular tissue.  There is no malodor present.  There is periwound erythema present extending to the dorsum of the foot.  Overall, the ulceration still looks better than it did last Friday.   VASCULAR:   Pedal pulses are palpable.  There is edema of the right foot.  Lab/Test Results:   Recent Labs  06/15/15 1315 06/16/15 1512  WBC 17.7* 22.7*  HGB 9.3* 8.6*  HCT 28.8* 24.9*  PLT 481* 369  NA 133* 127*  K 5.1 4.4  CL 99* 94*  CO2 24 23  BUN 53* 50*  CREATININE 4.26* 4.48*  GLUCOSE 306*  295*  CALCIUM 8.4* 8.2*    No results found for this or any previous visit (from the past 240 hour(s)).   Dg Foot Complete Right  06/16/2015   CLINICAL DATA:  Diabetic presenting with right foot swelling and a large blister on the lateral side of the right foot.  EXAM: RIGHT FOOT COMPLETE - 3+ VIEW  COMPARISON:  06/15/2015 and earlier.  FINDINGS: Prior amputation of the 5th ray at the  level of the proximal 5th metatarsal. Since yesterday's examination, development of a small amount of gas in the lateral soft tissues overlying the amputated 5th metatarsal. No evidence of underlying osteomyelitis involving the remaining 5th metatarsal. Prior amputation of the head of the 4th metatarsal. No evidence of acute fracture. Severe joint space narrowing and associated hypertrophic spurring involving the IP joint of the great toe. Remaining joint spaces well preserved. No erosions. Mild osseous demineralization.  IMPRESSION: 1. Soft tissue infection involving the lateral foot with gas in the lateral soft tissues which was not visible yesterday. 2. No evidence of osteomyelitis involving the underlying remaining 5th metatarsal or elsewhere. 3. 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   Electronically Signed   By: Hulan Saas M.D.   On: 06/16/2015 16:10   Dg Foot Complete Right  06/15/2015   CLINICAL DATA:  Diabetic foot ulcer  EXAM: RIGHT FOOT COMPLETE - 3+ VIEW  COMPARISON:  Right foot dated February 14, 2013  FINDINGS: The patient has undergone previous amputation of the distal aspect of the fourth metatarsal. The fourth phalanx remains present. The patient has undergone amputation of the fifth toe as well as the distal half of the fifth metatarsal. There is chronic cortical thickening of the proximal phalanx of the great toe. There are interphalangeal joint osteoarthritic changes diffusely greatest at the IP joint of the great toe. There is mild osteoarthritic change of the first MTP joint.  There are no bony changes to suggest osteomyelitis of the calcaneus. Due to the weight-bearing nature of the lateral film the extreme inferior or superficial aspect of the heel pad is excluded from the study.  IMPRESSION: There are extensive chronic changes involving the fourth metatarsal and there are post amputation changes of the fifth toe and distal portion of the fifth metatarsal. No definite changes of osteomyelitis of the calcaneus are observed.   Electronically Signed   By: David  [place] M.D.   On: 06/15/2015 14:01    Assessment: 1.  Cellulitis, right foot. 2.  Infected, nonhealing diabetic ulceration of the right foot.  Plan: 1.  IV antibiotics have been initiated with a pharmacy consult.  If there is no clinical improvement, I would consider infectious disease input regarding antibiotic selection. 2.  A wet to dry dressing was applied to the right foot.  I will change this daily. 3.  MRI of the right foot without contrast has been ordered. 4.  Surgery planned for tomorrow has been cancelled.  Thank you for your assistance with Mr. Bartoszek.  Lenee Franze IVAN 06/16/2015, 5:24 PM

## 2015-06-16 NOTE — ED Provider Notes (Signed)
CSN: [id number]     Arrival date & time 06/16/15  1415 History   First MD Initiated Contact with Patient 06/16/15 1433     Chief Complaint  Patient presents with  . Abnormal Lab     (Consider location/radiation/quality/duration/timing/severity/associated sxs/prior Treatment) HPI Comments: The patient is a 39 year old male, he has a history of diabetes, he has severe neuropathy below the knees bilaterally, he has had a prior fifth metatarsal and small toe amputation which was complicated by infections and chronic nonhealing wounds. He is currently followed by podiatry, Dr. [name] who was supposed to do surgery tomorrow however preop labs drawn yesterday showed that the patient had new anemia, high white blood cell counts and worsening renal failure. He does have chronic kidney disease stage IV however his creatinine was over 4. He reports fever as high as 101.5 last night. No medications given prior to arrival.  The history is provided by the patient.    Past Medical History  Diagnosis Date  . PVC (premature ventricular contraction)   . PAC (premature atrial contraction)   . Diabetes mellitus without complication   . Anxiety   . Depression   . Blood clot in vein     pt sts" it may have been in artery and not vein" of right leg  . GERD (gastroesophageal reflux disease)   . H/O hiatal hernia   . Barrett's esophagus   . Arthritis   . Neuropathy   . Herniated disc, cervical     c5 and c6 and c6 and c7  . Hypertension   . Kidney failure     stage 4 just dx 4 months ago.Sees Dr Elby Beck, nephrologist in [place] [phone].  .  . 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 Diabetic retinopathy of both eyes, associated with type 2 diabetes mellitus    Past Surgical History  Procedure Laterality Date  . Wrist arthroscopy      left with no break reapir of tendond and ligamants  . Toe amputation      5th digit right foot-[place]  . Debridement leg       lower leg-left  . Metatarsal head excision Right 02/14/2013    Procedure: 4TH METATARSAL HEAD RESECTION RIGHT FOOT;  Surgeon: Dallas Schimke, DPM;  Location: [facility];  Service: Orthopedics;  Laterality: Right;  latex allergy  . Vitrectomy Right    Family History  Problem Relation Age of Onset  . Cancer Mother     liver lung  . Cancer Father     liver  . Diabetes Mother   . Diabetes Father    History  Substance Use Topics  . Smoking status: Current Every Day Smoker -- 0.25 packs/day for 20 years    Types: Cigarettes  . Smokeless tobacco: Current User    Types: Chew  . Alcohol Use: 0.0 oz/week    0 Standard drinks or equivalent per week     Comment: rarely    Review of Systems  All other systems reviewed and are negative.     Allergies  Daptomycin; Dapsone; Latex; Penicillins; and Vancomycin  Home Medications   Prior to Admission medications   Medication Sig Start Date End Date Taking? Authorizing Provider  ALPRAZolam Prudy Feeler) 0.5 MG tablet Take 0.5 mg by mouth 3 (three) times daily.    Historical Provider, MD  cyclobenzaprine (FLEXERIL) 10 MG tablet Take 10 mg by mouth 2 (two) times daily.  Historical Provider, MD  doxycycline (VIBRAMYCIN) 100 MG capsule Take 100 mg by mouth 2 (two) times daily. 06/08/15   Historical Provider, MD  insulin glargine (LANTUS) 100 UNIT/ML injection Inject 42 Units into the skin at bedtime.     Historical Provider, MD  lisinopril (PRINIVIL,ZESTRIL) 20 MG tablet Take 20 mg by mouth 2 (two) times daily.    Historical Provider, MD  metoprolol (LOPRESSOR) 50 MG tablet Take 50 mg by mouth 2 (two) times daily.    Historical Provider, MD  mometasone (NASONEX) 50 MCG/ACT nasal spray Place 2 sprays into the nose daily.    Historical Provider, MD  NOVOLOG FLEXPEN 100 UNIT/ML FlexPen Inject 0-75 Units into the skin 3 (three) times daily after meals.  06/02/15   Historical Provider, MD  omega-3 acid ethyl esters (LOVAZA) 1 G capsule Take 1,000 mg  by mouth 2 (two) times daily. 05/06/15   Historical Provider, MD  omeprazole (PRILOSEC) 40 MG capsule Take 40 mg by mouth daily.    Historical Provider, MD  oxycodone (ROXICODONE) 30 MG immediate release tablet Take 30 mg by mouth 3 (three) times daily.     Historical Provider, MD  Vilazodone HCl (VIIBRYD) 20 MG TABS Take 20 mg by mouth daily.    Historical Provider, MD   BP 127/85 mmHg  Pulse 110  Temp(Src) 101 F (38.3 C) (Oral)  Resp 20  Ht 6\' 3"  (1.905 m)  Wt 244 lb (110.678 kg)  BMI 30.50 kg/m2  SpO2 100% Physical Exam  Constitutional: He appears well-developed and well-nourished. No distress.  HENT:  Head: Normocephalic and atraumatic.  Mouth/Throat: Oropharynx is clear and moist. No oropharyngeal exudate.  Eyes: Conjunctivae and EOM are normal. Pupils are equal, round, and reactive to light. Right eye exhibits no discharge. Left eye exhibits no discharge. No scleral icterus.  Neck: Normal range of motion. Neck supple. No JVD present. No thyromegaly present.  Cardiovascular: Normal rate, regular rhythm, normal heart sounds and intact distal pulses.  Exam reveals no gallop and no friction rub.   No murmur heard. Pulmonary/Chest: Effort normal and breath sounds normal. No respiratory distress. He has no wheezes. He has no rales.  Abdominal: Soft. Bowel sounds are normal. He exhibits no distension and no mass. There is no tenderness.  Musculoskeletal: Normal range of motion. He exhibits no edema or tenderness.  Lymphadenopathy:    He has no cervical adenopathy.  Neurological: He is alert. Coordination normal.  Skin: There is erythema.  Large wound to the right lateral foot. Diffuse erythema of the foot extending up to the ankle  Psychiatric: He has a normal mood and affect. His behavior is normal.  Nursing note and vitals reviewed.   ED Course  Procedures (including critical care time) Labs Review Labs Reviewed  CBC - Abnormal; Notable for the following:    WBC 22.7 (*)     RBC 2.94 (*)    Hemoglobin 8.6 (*)    HCT 24.9 (*)    All other components within normal limits  CULTURE, BLOOD (ROUTINE X 2)  CULTURE, BLOOD (ROUTINE X 2)  COMPREHENSIVE METABOLIC PANEL  I-STAT CG4 LACTIC ACID, ED    Imaging Review Dg Foot Complete Right  06/15/2015   CLINICAL DATA:  Diabetic foot ulcer  EXAM: RIGHT FOOT COMPLETE - 3+ VIEW  COMPARISON:  Right foot dated February 14, 2013  FINDINGS: The patient has undergone previous amputation of the distal aspect of the fourth metatarsal. The fourth phalanx remains present. The patient has undergone amputation  of the fifth toe as well as the distal half of the fifth metatarsal. There is chronic cortical thickening of the proximal phalanx of the great toe. There are interphalangeal joint osteoarthritic changes diffusely greatest at the IP joint of the great toe. There is mild osteoarthritic change of the first MTP joint. There are no bony changes to suggest osteomyelitis of the calcaneus. Due to the weight-bearing nature of the lateral film the extreme inferior or superficial aspect of the heel pad is excluded from the study.  IMPRESSION: There are extensive chronic changes involving the fourth metatarsal and there are post amputation changes of the fifth toe and distal portion of the fifth metatarsal. No definite changes of osteomyelitis of the calcaneus are observed.   Electronically Signed   By: David  [place] M.D.   On: 06/15/2015 14:01      MDM   Final diagnoses:  Swelling  Foot infection    The patient has no tachycardia on my exam however he is febrile, he has a source of infection in his right foot which appears to have a cellulitis, I suspect a deep tissue infection given his history of a large open wound, will obtain repeat labs to check for renal function, blood counts, saline lock, IV antibiotics, anticipate admission to the hospital. According to the patient the hospitalist was already consult for admission and recommended that the  patient come to the emergency department.  Discussed with the hospitalist who will admit to the hospital, IV antibiotics ordered, the patient does appear acutely ill with a diabetic foot infection.  Eber Hong, MD 06/16/15 [phone]

## 2015-06-16 NOTE — OR Nursing (Signed)
Procedure postponed  Due to abnormal labs.  Patient will be admitted to hospital today  for further treatment per office.

## 2015-06-16 NOTE — H&P (Signed)
[facility] History and Physical  Maurice CoasterJohn S [name] ZOX:[id number] DOB: 1976-09-08 DOA: 06/16/2015  Referring physician: Dr. Fleet ContrasB. Miller - [facility] PCP: PROVIDER NOT IN SYSTEM   Chief Complaint: R foot infection  HPI: Maurice CoasterJohn S [name] is a 39 y.o. male   Patient has had a chronic ongoing right foot infection and is status post resection of fourth and fifth metatarsals. Last operative procedure in March 2014. Since that time patient had done fairly well until May 2016 when he developed a blister on his foot. Patient has had ongoing wound management through Dr.McKinneys office and was most recently placed on doxycycline 8 days ago and underwent in office wound debridement 5 days ago. Patient was scheduled for more extensive debridement in the operating room here at any pen tomorrow but patient's physical condition and labs have grown significantly more abnormal and he was unsafe for the operating room. Of note patient has had fever for the last 7 days despite antibiotic therapy. In general patient states that he feels "sick." Cultures were obtained at patient's last debridement and are pending.     Review of Systems:  Constitutional:  No weight loss, night sweats, Fevers, chills. HEENT:  No headaches, Difficulty swallowing,Tooth/dental problems,Sore throat,  No sneezing, itching, ear ache, nasal congestion, post nasal drip,  Cardio-vascular:  No chest pain, Orthopnea, PND, swelling in lower extremities, anasarca, dizziness, palpitations  GI:  No heartburn, indigestion, abdominal pain, nausea, vomiting, diarrhea, change in bowel habits, loss of appetite  Resp:   No shortness of breath with exertion or at rest. No excess mucus, no productive cough, No non-productive cough, No coughing up of blood.No change in color of mucus.No wheezing.No chest wall deformity  Skin: Per HPI GU:  no dysuria, change in color of urine, no urgency or frequency. No flank pain.  Musculoskeletal:  Per HPI Psych:  No  change in mood or affect. No depression or anxiety. No memory loss.   Past Medical History  Diagnosis Date  . PVC (premature ventricular contraction)   . PAC (premature atrial contraction)   . Diabetes mellitus without complication   . Anxiety   . Depression   . Blood clot in vein     pt sts" it may have been in artery and not vein" of right leg  . GERD (gastroesophageal reflux disease)   . H/O hiatal hernia   . Barrett's esophagus   . Arthritis   . Neuropathy   . Herniated disc, cervical     c5 and c6 and c6 and c7  . Hypertension   . Kidney failure     stage 4 just dx 4 months ago.Sees Dr Elby BeckFlorencio Garcia, nephrologist in [place] [phone].  .  . 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 Diabetic retinopathy of both eyes, associated with type 2 diabetes mellitus    Past Surgical History  Procedure Laterality Date  . Wrist arthroscopy      left with no break reapir of tendond and ligamants  . Toe amputation      5th digit right foot-[place]  . Debridement leg      lower leg-left  . Metatarsal head excision Right 02/14/2013    Procedure: 4TH METATARSAL HEAD RESECTION RIGHT FOOT;  Surgeon: Dallas SchimkeBenjamin Ivan McKinney, DPM;  Location: [facility];  Service: Orthopedics;  Laterality: Right;  latex allergy  . Vitrectomy Right    Social History:  reports that he has been smoking Cigarettes.  He has  a 5 pack-year smoking history. His smokeless tobacco use includes Chew. He reports that he drinks alcohol. He reports that he does not use illicit drugs.  Allergies  Allergen Reactions  . Daptomycin     Hypotension   . Bactrim [Sulfamethoxazole-Trimethoprim] Other (See Comments)    Cannot take due to kidney issue  . Dapsone Nausea And Vomiting  . Latex Other (See Comments)    Blisters.   . Penicillins Hives  . Vancomycin Itching    Family History  Problem Relation Age of Onset  . Cancer Mother     liver lung  . Cancer Father     liver  . Diabetes  Mother   . Diabetes Father      Prior to Admission medications   Medication Sig Start Date End Date Taking? Authorizing Provider  ALPRAZolam Prudy Feeler) 0.5 MG tablet Take 0.5 mg by mouth 3 (three) times daily.    Historical Provider, MD  cyclobenzaprine (FLEXERIL) 10 MG tablet Take 10 mg by mouth 2 (two) times daily.    Historical Provider, MD  doxycycline (VIBRAMYCIN) 100 MG capsule Take 100 mg by mouth 2 (two) times daily. 06/08/15   Historical Provider, MD  insulin glargine (LANTUS) 100 UNIT/ML injection Inject 42 Units into the skin at bedtime.     Historical Provider, MD  lisinopril (PRINIVIL,ZESTRIL) 20 MG tablet Take 20 mg by mouth 2 (two) times daily.    Historical Provider, MD  metoprolol (LOPRESSOR) 50 MG tablet Take 50 mg by mouth 2 (two) times daily.    Historical Provider, MD  mometasone (NASONEX) 50 MCG/ACT nasal spray Place 2 sprays into the nose daily.    Historical Provider, MD  NOVOLOG FLEXPEN 100 UNIT/ML FlexPen Inject 0-75 Units into the skin 3 (three) times daily after meals.  06/02/15   Historical Provider, MD  omega-3 acid ethyl esters (LOVAZA) 1 G capsule Take 1,000 mg by mouth 2 (two) times daily. 05/06/15   Historical Provider, MD  omeprazole (PRILOSEC) 40 MG capsule Take 40 mg by mouth daily.    Historical Provider, MD  oxycodone (ROXICODONE) 30 MG immediate release tablet Take 30 mg by mouth 3 (three) times daily.     Historical Provider, MD  Vilazodone HCl (VIIBRYD) 20 MG TABS Take 20 mg by mouth daily.    Historical Provider, MD   Physical Exam: Filed Vitals:   06/16/15 1419  BP: 127/85  Pulse: 110  Temp: 101 F (38.3 C)  TempSrc: Oral  Resp: 20  Height: 6\' 3"  (1.905 m)  Weight: 110.678 kg (244 lb)  SpO2: 100%    Wt Readings from Last 3 Encounters:  06/16/15 110.678 kg (244 lb)  06/15/15 112.492 kg (248 lb)  02/14/13 102.967 kg (227 lb)    General:  Appears calm and comfortable Eyes:  PERRL, normal lids, irises & conjunctiva ENT: Dry MM Neck:  no LAD,  masses or thyromegaly Cardiovascular:  RRR, III/VI systolic murmur. Trace LE edema. Telemetry:  SR, no arrhythmias  Respiratory:  CTA bilaterally, no w/r/r. Normal respiratory effort. Abdomen:  soft, ntnd Skin:  Large necrotic lesion of the R lateral midfoot. Musculoskeletal: Right fourth and fifth digit amputations on right foot. Psychiatric: grossly normal mood and affect, speech fluent and appropriate Neurologic:  grossly non-focal.            Labs on Admission:  Basic Metabolic Panel:  Recent Labs Lab 06/15/15 1315 06/16/15 1512  NA 133* 127*  K 5.1 4.4  CL 99* 94*  CO2 24 23  GLUCOSE 306* 295*  BUN 53* 50*  CREATININE 4.26* 4.48*  CALCIUM 8.4* 8.2*   Liver Function Tests:  Recent Labs Lab 06/15/15 1315 06/16/15 1512  AST 21 18  ALT 20 20  ALKPHOS 123 112  BILITOT 0.4 0.4  PROT 7.1 7.3  ALBUMIN 2.0* 2.0*   No results for input(s): LIPASE, AMYLASE in the last 168 hours. No results for input(s): AMMONIA in the last 168 hours. CBC:  Recent Labs Lab 06/15/15 1315 06/16/15 1512  WBC 17.7* 22.7*  NEUTROABS 14.1*  --   HGB 9.3* 8.6*  HCT 28.8* 24.9*  MCV 87.5 84.7  PLT 481* 369   Cardiac Enzymes: No results for input(s): CKTOTAL, CKMB, CKMBINDEX, TROPONINI in the last 168 hours.  BNP (last 3 results) No results for input(s): BNP in the last 8760 hours.  ProBNP (last 3 results) No results for input(s): PROBNP in the last 8760 hours.  CBG: No results for input(s): GLUCAP in the last 168 hours.  Radiological Exams on Admission: Dg Foot Complete Right  06/16/2015   CLINICAL DATA:  Diabetic presenting with right foot swelling and a large blister on the lateral side of the right foot.  EXAM: RIGHT FOOT COMPLETE - 3+ VIEW  COMPARISON:  06/15/2015 and earlier.  FINDINGS: Prior amputation of the 5th ray at the level of the proximal 5th metatarsal. Since yesterday's examination, development of a small amount of gas in the lateral soft tissues overlying the  amputated 5th metatarsal. No evidence of underlying osteomyelitis involving the remaining 5th metatarsal. Prior amputation of the head of the 4th metatarsal. No evidence of acute fracture. Severe joint space narrowing and associated hypertrophic spurring involving the IP joint of the great toe. Remaining joint spaces well preserved. No erosions. Mild osseous demineralization.  IMPRESSION: 1. Soft tissue infection involving the lateral foot with gas in the lateral soft tissues which was not visible yesterday. 2. No evidence of osteomyelitis involving the underlying remaining 5th metatarsal or elsewhere. 3. 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   Electronically Signed   By: Hulan Saas M.D.   On: 06/16/2015 16:10   Dg Foot Complete Right  06/15/2015   CLINICAL DATA:  Diabetic foot ulcer  EXAM: RIGHT FOOT COMPLETE - 3+ VIEW  COMPARISON:  Right foot dated February 14, 2013  FINDINGS: The patient has undergone previous amputation of the distal aspect of the fourth metatarsal. The fourth phalanx remains present. The patient has undergone amputation of the fifth toe as well as the distal half of the fifth metatarsal. There is chronic cortical thickening of the proximal phalanx of the great toe. There are interphalangeal joint osteoarthritic changes diffusely greatest at the IP joint of the great toe. There is mild osteoarthritic change of the first MTP joint. There are no bony changes to suggest osteomyelitis of the calcaneus. Due to the weight-bearing nature of the lateral film the extreme inferior or superficial aspect of the heel pad is excluded from the study.  IMPRESSION: There are extensive chronic changes involving the fourth metatarsal and there are post amputation changes of the fifth toe and distal portion of the fifth metatarsal. No definite changes of osteomyelitis of the calcaneus are observed.   Electronically Signed   By: Gabrianna Fassnacht  [place] M.D.   On: 06/15/2015 14:01       Assessment/Plan Principal Problem:   Sepsis Active Problems:   Diabetic foot infection   Acute renal failure superimposed on stage 4 chronic kidney disease  Anemia   Diabetes mellitus with complication   Essential hypertension   Depression with anxiety   GERD (gastroesophageal reflux disease)   Lower extremity edema   Sepsis: Patient meets sepsis criteria. Patient very concerned about sepsis as he is been admitted for this before. WBC 22.7, febrile, tachycardic, acute on chronic renal failure, CRP 20.7, Cr 4.26, obvious source of infection and necrotic tissue and right foot. Lactic acid pending - Stepdown - IV clindamycin - Blood cultures pending (of note patient has been on antibiotics prior to admission, doxycycline) - IVF  R foot diabetic wound: Aggressive white foot wound despite outpatient antibiotic therapy and close wound management. Discussed case with Dr.Mckinney, who referred patient to) hospital and will follow and manage while here. Patient with several anabolic allergies.  - Start IV clindamycin and stop Doxy. (allergy PCN, Vanc, Dapto) - pharm consult - Follow-up recommendations by Dr.McKinney  Acute on chronic anemia: Hgb 8.6. Difficult to ascertain baseline as last hemoglobin measured in 2014 of 12.9. Anticipate general decline due to renal failure as well as other chronic ongoing medical issues. - Anemia panel - Stool guaiac - Renal consult as below  Acute on chronic kidney disease: Creatinine 4.26. Previous from 2014 of 1.72. Patient has been in gradual decline since this time and is currently on the renal and pancreatic transplant list at [facility]. Patient falls with nephrology regularly but is not currently a dialysis candidate as he continues to make urine. - Consider Nephrology consult if not improving.  - IVF - avoid nephrotoxic meds   DM: CBG 306 on admission. On injectables - SSI - A1c  LE edema:  - continue lasix  GERD: - continue  PPI  Depression/Anxiety: - continue Viibryd, Xanax  HTN: normotensive - Hold lisinopril due to acute renal failure - Continue metoprolol,    Code Status: FULL DVT Prophylaxis: Hep Family Communication: sister Disposition Plan: Pending improvement  Josephanthony Tindel Shela Commons, MD Family Medicine [facility] [email or web address] Password [email or web address]

## 2015-06-16 NOTE — ED Notes (Signed)
Called by Dr Nolen MuMcKinney to come in for abnormal labs.  Due for foot surgery tomorrow.  Told to come to ER for admission to hospital.

## 2015-06-17 ENCOUNTER — Ambulatory Visit (HOSPITAL_COMMUNITY): Admission: RE | Admit: 2015-06-17 | Payer: Medicare Other | Source: Ambulatory Visit | Admitting: Podiatry

## 2015-06-17 ENCOUNTER — Encounter (HOSPITAL_COMMUNITY): Admission: RE | Payer: Self-pay | Source: Ambulatory Visit

## 2015-06-17 ENCOUNTER — Inpatient Hospital Stay (HOSPITAL_COMMUNITY): Payer: Medicare Other

## 2015-06-17 ENCOUNTER — Encounter (HOSPITAL_COMMUNITY): Payer: Self-pay | Admitting: Internal Medicine

## 2015-06-17 DIAGNOSIS — R7881 Bacteremia: Secondary | ICD-10-CM | POA: Diagnosis not present

## 2015-06-17 DIAGNOSIS — K219 Gastro-esophageal reflux disease without esophagitis: Secondary | ICD-10-CM | POA: Diagnosis present

## 2015-06-17 DIAGNOSIS — N179 Acute kidney failure, unspecified: Secondary | ICD-10-CM | POA: Diagnosis present

## 2015-06-17 DIAGNOSIS — Z91040 Latex allergy status: Secondary | ICD-10-CM | POA: Diagnosis not present

## 2015-06-17 DIAGNOSIS — F17210 Nicotine dependence, cigarettes, uncomplicated: Secondary | ICD-10-CM | POA: Diagnosis present

## 2015-06-17 DIAGNOSIS — E118 Type 2 diabetes mellitus with unspecified complications: Secondary | ICD-10-CM | POA: Diagnosis not present

## 2015-06-17 DIAGNOSIS — N185 Chronic kidney disease, stage 5: Secondary | ICD-10-CM | POA: Diagnosis present

## 2015-06-17 DIAGNOSIS — E1142 Type 2 diabetes mellitus with diabetic polyneuropathy: Secondary | ICD-10-CM | POA: Diagnosis present

## 2015-06-17 DIAGNOSIS — N184 Chronic kidney disease, stage 4 (severe): Secondary | ICD-10-CM

## 2015-06-17 DIAGNOSIS — G894 Chronic pain syndrome: Secondary | ICD-10-CM | POA: Diagnosis present

## 2015-06-17 DIAGNOSIS — E11319 Type 2 diabetes mellitus with unspecified diabetic retinopathy without macular edema: Secondary | ICD-10-CM | POA: Diagnosis present

## 2015-06-17 DIAGNOSIS — E1122 Type 2 diabetes mellitus with diabetic chronic kidney disease: Secondary | ICD-10-CM | POA: Diagnosis present

## 2015-06-17 DIAGNOSIS — Z79891 Long term (current) use of opiate analgesic: Secondary | ICD-10-CM | POA: Diagnosis not present

## 2015-06-17 DIAGNOSIS — A419 Sepsis, unspecified organism: Secondary | ICD-10-CM | POA: Diagnosis present

## 2015-06-17 DIAGNOSIS — L97519 Non-pressure chronic ulcer of other part of right foot with unspecified severity: Secondary | ICD-10-CM | POA: Diagnosis present

## 2015-06-17 DIAGNOSIS — R609 Edema, unspecified: Secondary | ICD-10-CM | POA: Diagnosis present

## 2015-06-17 DIAGNOSIS — E871 Hypo-osmolality and hyponatremia: Secondary | ICD-10-CM | POA: Diagnosis present

## 2015-06-17 DIAGNOSIS — E1151 Type 2 diabetes mellitus with diabetic peripheral angiopathy without gangrene: Secondary | ICD-10-CM | POA: Diagnosis present

## 2015-06-17 DIAGNOSIS — I120 Hypertensive chronic kidney disease with stage 5 chronic kidney disease or end stage renal disease: Secondary | ICD-10-CM | POA: Diagnosis present

## 2015-06-17 DIAGNOSIS — E1121 Type 2 diabetes mellitus with diabetic nephropathy: Secondary | ICD-10-CM | POA: Diagnosis present

## 2015-06-17 DIAGNOSIS — F418 Other specified anxiety disorders: Secondary | ICD-10-CM | POA: Diagnosis present

## 2015-06-17 DIAGNOSIS — E11628 Type 2 diabetes mellitus with other skin complications: Secondary | ICD-10-CM | POA: Diagnosis present

## 2015-06-17 DIAGNOSIS — M869 Osteomyelitis, unspecified: Secondary | ICD-10-CM | POA: Diagnosis present

## 2015-06-17 DIAGNOSIS — E861 Hypovolemia: Secondary | ICD-10-CM | POA: Diagnosis present

## 2015-06-17 DIAGNOSIS — Z881 Allergy status to other antibiotic agents status: Secondary | ICD-10-CM | POA: Diagnosis not present

## 2015-06-17 DIAGNOSIS — E11621 Type 2 diabetes mellitus with foot ulcer: Secondary | ICD-10-CM | POA: Diagnosis present

## 2015-06-17 DIAGNOSIS — Z794 Long term (current) use of insulin: Secondary | ICD-10-CM | POA: Diagnosis not present

## 2015-06-17 DIAGNOSIS — L03115 Cellulitis of right lower limb: Secondary | ICD-10-CM | POA: Diagnosis present

## 2015-06-17 DIAGNOSIS — D509 Iron deficiency anemia, unspecified: Secondary | ICD-10-CM | POA: Diagnosis present

## 2015-06-17 DIAGNOSIS — E872 Acidosis: Secondary | ICD-10-CM | POA: Diagnosis present

## 2015-06-17 DIAGNOSIS — D638 Anemia in other chronic diseases classified elsewhere: Secondary | ICD-10-CM | POA: Diagnosis not present

## 2015-06-17 DIAGNOSIS — Z833 Family history of diabetes mellitus: Secondary | ICD-10-CM | POA: Diagnosis not present

## 2015-06-17 HISTORY — DX: Osteomyelitis, unspecified: M86.9

## 2015-06-17 LAB — HEMOGLOBIN A1C
Hgb A1c MFr Bld: 10.1 % — ABNORMAL HIGH (ref 4.8–5.6)
MEAN PLASMA GLUCOSE: 243 mg/dL

## 2015-06-17 LAB — BASIC METABOLIC PANEL
Anion gap: 9 (ref 5–15)
BUN: 45 mg/dL — AB (ref 6–20)
CO2: 20 mmol/L — AB (ref 22–32)
CREATININE: 4.2 mg/dL — AB (ref 0.61–1.24)
Calcium: 7.9 mg/dL — ABNORMAL LOW (ref 8.9–10.3)
Chloride: 101 mmol/L (ref 101–111)
GFR calc Af Amer: 19 mL/min — ABNORMAL LOW (ref >60)
GFR calc non Af Amer: 16 mL/min — ABNORMAL LOW (ref >60)
Glucose, Bld: 140 mg/dL — ABNORMAL HIGH (ref 65–99)
Potassium: 4.1 mmol/L (ref 3.5–5.1)
Sodium: 130 mmol/L — ABNORMAL LOW (ref 135–145)

## 2015-06-17 LAB — CBC
HCT: 23.3 % — ABNORMAL LOW (ref 39.0–52.0)
Hemoglobin: 7.8 g/dL — ABNORMAL LOW (ref 13.0–17.0)
MCH: 28.7 pg (ref 26.0–34.0)
MCHC: 33.5 g/dL (ref 30.0–36.0)
MCV: 85.7 fL (ref 78.0–100.0)
Platelets: 333 10*3/uL (ref 150–400)
RBC: 2.72 MIL/uL — ABNORMAL LOW (ref 4.22–5.81)
RDW: 13.6 % (ref 11.5–15.5)
WBC: 14.2 10*3/uL — ABNORMAL HIGH (ref 4.0–10.5)

## 2015-06-17 LAB — GLUCOSE, CAPILLARY
Glucose-Capillary: 136 mg/dL — ABNORMAL HIGH (ref 65–99)
Glucose-Capillary: 181 mg/dL — ABNORMAL HIGH (ref 65–99)
Glucose-Capillary: 191 mg/dL — ABNORMAL HIGH (ref 65–99)
Glucose-Capillary: 263 mg/dL — ABNORMAL HIGH (ref 65–99)

## 2015-06-17 LAB — RETICULOCYTES
RBC.: 2.71 MIL/uL — ABNORMAL LOW (ref 4.22–5.81)
Retic Count, Absolute: 21.7 10*3/uL (ref 19.0–186.0)
Retic Ct Pct: 0.8 % (ref 0.4–3.1)

## 2015-06-17 SURGERY — IRRIGATION AND DEBRIDEMENT WOUND
Anesthesia: Monitor Anesthesia Care | Laterality: Right

## 2015-06-17 MED ORDER — INSULIN GLARGINE 100 UNIT/ML ~~LOC~~ SOLN
10.0000 [IU] | Freq: Two times a day (BID) | SUBCUTANEOUS | Status: DC
  Administered 2015-06-17 – 2015-06-18 (×3): 10 [IU] via SUBCUTANEOUS
  Filled 2015-06-17 (×5): qty 0.1

## 2015-06-17 MED ORDER — VILAZODONE HCL 20 MG PO TABS
20.0000 mg | ORAL_TABLET | Freq: Every day | ORAL | Status: DC
Start: 2015-06-17 — End: 2015-06-20
  Administered 2015-06-17 – 2015-06-19 (×3): 20 mg via ORAL
  Filled 2015-06-17: qty 1

## 2015-06-17 MED ORDER — INSULIN ASPART 100 UNIT/ML ~~LOC~~ SOLN: 0.0000 [IU] | Freq: Every day | SUBCUTANEOUS | Status: DC

## 2015-06-17 MED ORDER — ONDANSETRON HCL 4 MG/2ML IJ SOLN: 4.0000 mg | Freq: Four times a day (QID) | INTRAMUSCULAR | Status: DC | PRN

## 2015-06-17 MED ORDER — FERROUS SULFATE 325 (65 FE) MG PO TABS
325.0000 mg | ORAL_TABLET | Freq: Every day | ORAL | Status: DC
  Administered 2015-06-18 – 2015-06-20 (×2): 325 mg via ORAL
  Filled 2015-06-17 (×2): qty 1

## 2015-06-17 MED ORDER — INSULIN ASPART 100 UNIT/ML ~~LOC~~ SOLN
0.0000 [IU] | Freq: Three times a day (TID) | SUBCUTANEOUS | Status: DC
  Administered 2015-06-17: 2 [IU] via SUBCUTANEOUS

## 2015-06-17 MED ORDER — SODIUM CHLORIDE 0.9 % IV SOLN: Freq: Once | INTRAVENOUS | Status: AC

## 2015-06-17 MED ORDER — CIPROFLOXACIN IN D5W 400 MG/200ML IV SOLN
400.0000 mg | INTRAVENOUS | Status: DC
  Administered 2015-06-17 – 2015-06-18 (×2): 400 mg via INTRAVENOUS
  Filled 2015-06-17 (×2): qty 200

## 2015-06-17 MED ORDER — SODIUM BICARBONATE 650 MG PO TABS
650.0000 mg | ORAL_TABLET | Freq: Three times a day (TID) | ORAL | Status: DC
  Administered 2015-06-17 – 2015-06-20 (×8): 650 mg via ORAL
  Filled 2015-06-17 (×8): qty 1

## 2015-06-17 MED ORDER — INSULIN ASPART 100 UNIT/ML ~~LOC~~ SOLN
0.0000 [IU] | Freq: Three times a day (TID) | SUBCUTANEOUS | Status: DC
Start: 2015-06-17 — End: 2015-06-20
  Administered 2015-06-17 – 2015-06-18 (×2): 3 [IU] via SUBCUTANEOUS
  Administered 2015-06-18 – 2015-06-19 (×3): 5 [IU] via SUBCUTANEOUS
  Administered 2015-06-20: 2 [IU] via SUBCUTANEOUS
  Administered 2015-06-20: 3 [IU] via SUBCUTANEOUS

## 2015-06-17 MED ORDER — INSULIN ASPART 100 UNIT/ML ~~LOC~~ SOLN
0.0000 [IU] | Freq: Every day | SUBCUTANEOUS | Status: DC
  Administered 2015-06-17: 3 [IU] via SUBCUTANEOUS
  Administered 2015-06-18 – 2015-06-19 (×2): 2 [IU] via SUBCUTANEOUS

## 2015-06-17 MED ORDER — VILAZODONE HCL 20 MG PO TABS: 20.0000 mg | ORAL_TABLET | Freq: Every day | ORAL | Status: DC

## 2015-06-17 NOTE — Progress Notes (Signed)
Addendum:  I discussed the patient's case with ID physician on call, Dr. Enedina FinnerJeff Hatcher. Per our conversation, clindamycin and Cipro are good choices in the setting of multiple antibiotic allergies. However, if he continues to spike through the Cipro and clindamycin over the next 24-48 hours, he suggested stopping clindamycin and starting Zyvox. If debridement occurs, he recommends continuing Cipro and Zyvox, both of which can be given orally, for 3-4 weeks. If the patient ends up needing an amputation, he would recommend Cipro and Zyvox for 1- 2 weeks following.

## 2015-06-17 NOTE — Consult Note (Signed)
Maurice [name] MRN: [id number] DOB/AGE: [birth year] 38 y.o. Primary Care Physician:PROVIDER NOT IN SYSTEM Admit date: 06/16/2015 Chief Complaint:  Chief Complaint  Patient presents with  . Abnormal Lab   HPI: Pt is [age] year male with pasy medical hx of CKD, DM who came to Er with c/o Foot ulcer.  HPI dates back to few weeks when pt developed ulcer in his foot, pt has been managed as outpt. Pt was to have debridement but he developed fever and had abnormal labs. Pt on presentation to ER yesterday was diagnosed with sepsis sec to osteomyelitis and admitted for further care. [facility] and has baseline creat of 3-4. Pt offers no c./o chest pain  NO c/o nausea/ vomiting NO c/o freq/urgency/dyuria. NO c/o syncope No c/o hematuria    Past Medical History  Diagnosis Date  . PVC (premature ventricular contraction)   . PAC (premature atrial contraction)   . Diabetes mellitus without complication   . Anxiety   . Depression   . Blood clot in vein     pt sts" it may have been in artery and not vein" of right leg  . GERD (gastroesophageal reflux disease)   . H/O hiatal hernia   . Barrett's esophagus   . Arthritis   . Neuropathy   . Herniated disc, cervical     c5 and c6 and c6 and c7  . Hypertension   . Kidney failure     stage 4 just dx 4 months ago.Sees Dr Elby BeckFlorencio Garcia, nephrologist in [place] [phone].  .  . 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 Diabetic retinopathy of both eyes, associated with type 2 diabetes mellitus   . Osteomyelitis of right foot 06/17/2015  . Diabetic foot infection 06/16/2015       Family History  Problem Relation Age of Onset  . Cancer Mother     liver lung  . Cancer Father     liver  . Diabetes Mother   . Diabetes Father     Social History:  reports that he has been smoking Cigarettes.  He has a 5 pack-year smoking history. His smokeless tobacco use includes Chew. He reports that  he drinks alcohol. He reports that he does not use illicit drugs.   Allergies:  Allergies  Allergen Reactions  . Daptomycin     Hypotension   . Bactrim [Sulfamethoxazole-Trimethoprim] Other (See Comments)    Cannot take due to kidney issue  . Dapsone Nausea And Vomiting  . Latex Other (See Comments)    Blisters.   . Penicillins Hives  . Vancomycin Itching    Medications Prior to Admission  Medication Sig Dispense Refill  . ALPRAZolam (XANAX) 0.5 MG tablet Take 0.5 mg by mouth 3 (three) times daily.    . cyclobenzaprine (FLEXERIL) 10 MG tablet Take 10 mg by mouth 2 (two) times daily.    . furosemide (LASIX) 20 MG tablet Take 20 mg by mouth daily.     . insulin glargine (LANTUS) 100 UNIT/ML injection Inject 42 Units into the skin at bedtime.     Marland Kitchen. lisinopril (PRINIVIL,ZESTRIL) 20 MG tablet Take 20 mg by mouth 2 (two) times daily.    . metoprolol (LOPRESSOR) 50 MG tablet Take 50 mg by mouth 2 (two) times daily.    . mometasone (NASONEX) 50 MCG/ACT nasal spray Place 2 sprays into the nose daily as needed (for allergies).     .Marland Kitchen  NOVOLOG FLEXPEN 100 UNIT/ML FlexPen Inject 0-75 Units into the skin 3 (three) times daily after meals.   1  . omega-3 acid ethyl esters (LOVAZA) 1 G capsule Take 1,000 mg by mouth 2 (two) times daily.  11  . omeprazole (PRILOSEC) 40 MG capsule Take 40 mg by mouth daily.    Marland Kitchen oxycodone (ROXICODONE) 30 MG immediate release tablet Take 30 mg by mouth 3 (three) times daily.     . Vilazodone HCl (VIIBRYD) 20 MG TABS Take 20 mg by mouth daily.         ZOX:WRUEA from the symptoms mentioned above,there are no other symptoms referable to all systems reviewed.  . ALPRAZolam  0.5 mg Oral TID  . ciprofloxacin  400 mg Intravenous Q24H  . clindamycin (CLEOCIN) IV  600 mg Intravenous 3 times per day  . cyclobenzaprine  10 mg Oral BID  . [START ON 06/18/2015] ferrous sulfate  325 mg Oral Q breakfast  . furosemide  20 mg Oral Daily  . heparin  5,000 Units Subcutaneous 3  times per day  . insulin aspart  0-5 Units Subcutaneous QHS  . insulin aspart  0-9 Units Subcutaneous TID WC  . insulin glargine  10 Units Subcutaneous BID  . metoprolol  50 mg Oral BID  . pantoprazole  80 mg Oral Daily  . sodium chloride  3 mL Intravenous Q12H  . Vilazodone HCl  20 mg Oral QHS      Physical Exam: Vital signs in last 24 hours: Temp:  [99.6 F (37.6 C)-103.2 F (39.6 C)] 103.2 F (39.6 C) (07/20 1425) Pulse Rate:  [100-114] 102 (07/20 1425) Resp:  [14-16] 16 (07/20 1317) BP: (139-183)/(61-97) 144/66 mmHg (07/20 1425) SpO2:  [95 %-100 %] 100 % (07/20 1317) Weight:  [241 lb 9.6 oz (109.589 kg)] 241 lb 9.6 oz (109.589 kg) (07/19 1758) Weight change:  Last BM Date: 06/15/15  Intake/Output from previous day: 07/19 0701 - 07/20 0700 In: 1022.9 [I.V.:1022.9] Out: -  Total I/O In: 730 [P.O.:480; IV Piggyback:250] Out: -    Physical Exam: General- pt is awake,alert, oriented to time place and person Resp- No acute REsp distress, CTA B/L NO Rhonchi CVS- S1S2 regular in rate and rhythm GIT- BS+, soft, NT, ND EXT- Right sided LE Edema, NO Cyanosis CNS- CN 2-12 grossly intact. Moving all 4 extremities Psych- normal mood and affect   Lab Results: CBC  Recent Labs  06/16/15 1512 06/17/15 0711  WBC 22.7* 14.2*  HGB 8.6* 7.8*  HCT 24.9* 23.3*  PLT 369 333    BMET  Recent Labs  06/16/15 1512 06/17/15 0711  NA 127* 130*  K 4.4 4.1  CL 94* 101  CO2 23 20*  GLUCOSE 295* 140*  BUN 50* 45*  CREATININE 4.48* 4.20*  CALCIUM 8.2* 7.9*   Creat trend 2016  4.48=>4.20          3-4 - baseline  2014  1.72  MICRO Recent Results (from the past 240 hour(s))  Blood culture (routine x 2)     Status: None (Preliminary result)   Collection Time: 06/16/15  2:58 PM  Result Value Ref Range Status   Specimen Description BLOOD LEFT ANTECUBITAL  Final   Special Requests BOTTLES DRAWN AEROBIC AND ANAEROBIC 6CC EACH  Final   Culture NO GROWTH < 24 HOURS  Final    Report Status PENDING  Incomplete  Blood culture (routine x 2)     Status: None (Preliminary result)   Collection Time: 06/16/15  3:12 PM  Result Value Ref Range Status   Specimen Description BLOOD RIGHT ANTECUBITAL  Final   Special Requests   Final    BOTTLES DRAWN AEROBIC AND ANAEROBIC AEB=6CC ANA=5CC   Culture NO GROWTH < 24 HOURS  Final   Report Status PENDING  Incomplete  Culture, blood (routine x 2)     Status: None (Preliminary result)   Collection Time: 06/17/15  7:11 AM  Result Value Ref Range Status   Specimen Description BLOOD  Final   Special Requests BLOOD  Final   Culture NO GROWTH < 12 HOURS  Final   Report Status PENDING  Incomplete  Culture, blood (routine x 2)     Status: None (Preliminary result)   Collection Time: 06/17/15  7:19 AM  Result Value Ref Range Status   Specimen Description BLOOD  Final   Special Requests BLOOD  Final   Culture NO GROWTH < 12 HOURS  Final   Report Status PENDING  Incomplete      Lab Results  Component Value Date   CALCIUM 7.9* 06/17/2015      Impression: 1)Renal  AKI secondary to prerenal/ ATN                AKI sec to Hypovolemia/ Sepsis/RAAS blockers                AKI on CKD               CKD stage 4.               CKD since 2014 ( most likely before that)               CKD secondary to DM                Progression of CKD now marked with AKI                2)HTN  Medication- On Diuretics On Beta blockers   3)Anemia HGb not at goal (9--11) Will initiate anemia work up  4)CKD Mineral-Bone Disorder PTH not avail. Secondary Hyperparathyroidism w/u pending . Phosphorus and Vitamin  will check.   5)ID - admitted with Sepsis sec to Osteomyelitis Primary MD following  6)Electrolytes Normokalemic  Hyponatremic  improving  7)Acid base Co2 not  at goal     Plan:  Will suggest to please get data from his nephrologist Will ask for anemia work up Will ask for CKD- BMD work up- if not able to get data  from outside Agree with current IVF Will suggest to check BMet q daily to see crea and Na trend Will start po bicrab Educated pt about fall in GFR and possible need of HD .      Maher Shon S 06/17/2015, 2:30 PM

## 2015-06-17 NOTE — Care Management Note (Signed)
Case Management Note  Patient Details  Name: Maurice [name] MRN: [id number] Date of Birth: 11/28/1976  Expected Discharge Date:   06/21/2015               Expected Discharge Plan:  Home w Home Health Services  In-House Referral:  NA  Discharge planning Services  CM Consult  Post Acute Care Choice:  Home Health, Durable Medical Equipment Choice offered to:  Patient  DME Arranged:  Negative pressure wound device DME Agency:  [facility]  HH Arranged:  RN [facility]  Status of Service:  In process, will continue to follow  Medicare Important Message Given:    Date Medicare IM Given:    Medicare IM give by:    Date Additional Medicare IM Given:    Additional Medicare Important Message give by:     If discussed at Long Length of Stay Meetings, dates discussed:    Additional Comments: Patient is from home and independent at baseline. Pt was scheduled for surgery on 06/18/2015 but that has been postponed due to infection. [facility] and has requested to [facility] again. [facility] contacted and per Bonita QuinLinda, no HH has been ordered at this time. Pt will need HH orders prior to DC. Patient's wound vac has already been ordered by podiatry and it is in pt's room. Wound vac is from [employer].  Pt anticipated to go home on PO abx at this time. Zyvoxx is recommended. CM will need to perform benefits check and if not covered pt may need to contact Zyvoxx assistance program. CM will cont to follow for CM needs and DC planning.    Malcolm Metrohildress, Dechelle Attaway Demske, RN 06/17/2015, 2:23 PM

## 2015-06-17 NOTE — Progress Notes (Signed)
Dr. Selena BattenKim notified via phone of temperature 102.4 oral.  [facility]  Nursing staff to continue to monitor.

## 2015-06-17 NOTE — Progress Notes (Signed)
Pt's oral temp 103.2 at this time.  Pt had received PRN Tylenol approximately 2 hours ago.  Dr. Sherrie MustacheFisher paged and made aware.  No new orders at this time.  Will continue to monitor.

## 2015-06-17 NOTE — Progress Notes (Signed)
CRITICAL VALUE ALERT  Critical value received:  Anerobe Blood Culture Positive for Gram Positive Cocci in Chains  Date of notification:  06/17/15  Time of notification:  1742  Critical value read back:Yes.    Nurse who received alert:  Dagoberto LigasJessica Vennie Waymire, RN  MD notified (1st page):  Dr. Sherrie MustacheFisher  Time of first page:  1746  MD notified (2nd page):  Time of second page:  Responding MD:    Time MD responded:

## 2015-06-17 NOTE — Progress Notes (Signed)
ANTIBIOTIC CONSULT NOTE - INITIAL  Pharmacy Consult for Cipro Indication: diabetic foot infection / sepsis   Allergies  Allergen Reactions  . Daptomycin     Hypotension   . Bactrim [Sulfamethoxazole-Trimethoprim] Other (See Comments)    Cannot take due to kidney issue  . Dapsone Nausea And Vomiting  . Latex Other (See Comments)    Blisters.   . Penicillins Hives  . Vancomycin Itching   Patient Measurements: Height:  (190.5 cm) Weight: 241 lb 9.6 oz (109.589 kg) IBW/kg (Calculated) : 84.5  Vital Signs: Temp: 100.5 F (38.1 C) (07/20 1222) Temp Source: Oral (07/20 1222) BP: 139/68 mmHg (07/20 0559) Pulse Rate: 101 (07/20 0559) Intake/Output from previous day: 07/19 0701 - 07/20 0700 In: 1022.9 [I.V.:1022.9] Out: -  Intake/Output from this shift: Total I/O In: 240 [P.O.:240] Out: -   Labs:  Recent Labs  06/15/15 1315 06/16/15 1512 06/17/15 0711  WBC 17.7* 22.7* 14.2*  HGB 9.3* 8.6* 7.8*  PLT 481* 369 333  CREATININE 4.26* 4.48* 4.20*   Estimated Creatinine Clearance: 31.3 mL/min (by C-G formula based on Cr of 4.2). No results for input(s): VANCOTROUGH, VANCOPEAK, VANCORANDOM, GENTTROUGH, GENTPEAK, GENTRANDOM, TOBRATROUGH, TOBRAPEAK, TOBRARND, AMIKACINPEAK, AMIKACINTROU, AMIKACIN in the last 72 hours.   Microbiology: Recent Results (from the past 720 hour(s))  Blood culture (routine x 2)     Status: None (Preliminary result)   Collection Time: 06/16/15  2:58 PM  Result Value Ref Range Status   Specimen Description BLOOD LEFT ANTECUBITAL  Final   Special Requests BOTTLES DRAWN AEROBIC AND ANAEROBIC 6CC EACH  Final   Culture NO GROWTH < 24 HOURS  Final   Report Status PENDING  Incomplete  Blood culture (routine x 2)     Status: None (Preliminary result)   Collection Time: 06/16/15  3:12 PM  Result Value Ref Range Status   Specimen Description BLOOD RIGHT ANTECUBITAL  Final   Special Requests   Final    BOTTLES DRAWN AEROBIC AND ANAEROBIC AEB=6CC  ANA=5CC   Culture NO GROWTH < 24 HOURS  Final   Report Status PENDING  Incomplete  Culture, blood (routine x 2)     Status: None (Preliminary result)   Collection Time: 06/17/15  7:11 AM  Result Value Ref Range Status   Specimen Description BLOOD  Final   Special Requests BLOOD  Final   Culture NO GROWTH < 12 HOURS  Final   Report Status PENDING  Incomplete  Culture, blood (routine x 2)     Status: None (Preliminary result)   Collection Time: 06/17/15  7:19 AM  Result Value Ref Range Status   Specimen Description BLOOD  Final   Special Requests BLOOD  Final   Culture NO GROWTH < 12 HOURS  Final   Report Status PENDING  Incomplete   Medical History: Past Medical History  Diagnosis Date  . PVC (premature ventricular contraction)   . PAC (premature atrial contraction)   . Diabetes mellitus without complication   . Anxiety   . Depression   . Blood clot in vein     pt sts" it may have been in artery and not vein" of right leg  . GERD (gastroesophageal reflux disease)   . H/O hiatal hernia   . Barrett's esophagus   . Arthritis   . Neuropathy   . Herniated disc, cervical     c5 and c6 and c6 and c7  . Hypertension   . Kidney failure     stage 4 just dx  4 months ago.Sees Dr Elby BeckFlorencio Garcia, [place] in [place] [phone].  .  . 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 Diabetic retinopathy of both eyes, associated with type 2 diabetes mellitus   . Osteomyelitis of right foot 06/17/2015  . Diabetic foot infection 06/16/2015   Anti-infectives    Start     Dose/Rate Route Frequency Ordered Stop   06/17/15 1000  ciprofloxacin (CIPRO) IVPB 400 mg     400 mg 200 mL/hr over 60 Minutes Intravenous Every 24 hours 06/17/15 0827     06/16/15 2300  clindamycin (CLEOCIN) IVPB 600 mg     600 mg 100 mL/hr over 30 Minutes Intravenous 3 times per day 06/16/15 1615     06/16/15 1500  clindamycin (CLEOCIN) IVPB 900 mg     900 mg 100 mL/hr over 30 Minutes  Intravenous  Once 06/16/15 1458 06/16/15 1647     Assessment: 39yo male admitted with fever 101, tachycardia and infected foot wound.  Pt on Clindamycin, asked to initiate Cipro.  Drug allergies noted.   SCr is elevated.   Normalized clcr ~ 6023ml/min  Goal of Therapy:  Eradicate infection.  Plan:  Continue Clindamycin per MD Cipro 400mg  IV q24hrs Monitor labs, renal fxn, and c/s Deescalate / switch to PO ABX when improved / appropriate  Valrie HartHall, Cyndy Braver A 06/17/2015,12:35 PM

## 2015-06-17 NOTE — Progress Notes (Signed)
Podiatry Progress Note  Subjective: Maurice [name] is a 39 y.o. male being followed for an infected diabetic ulceration of his right foot.  An MRI has been performed.  He denies any current nausea, vomiting or chills.    Past Medical History  Diagnosis Date  . PVC (premature ventricular contraction)   . PAC (premature atrial contraction)   . Diabetes mellitus without complication   . Anxiety   . Depression   . Blood clot in vein     pt sts" it may have been in artery and not vein" of right leg  . GERD (gastroesophageal reflux disease)   . H/O hiatal hernia   . Barrett's esophagus   . Arthritis   . Neuropathy   . Herniated disc, cervical     c5 and c6 and c6 and c7  . Hypertension   . Kidney failure     stage 4 just dx 4 months ago.Sees Dr Elby Beck, nephrologist in [place] [phone].  .  . 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 Diabetic retinopathy of both eyes, associated with type 2 diabetes mellitus   . Osteomyelitis of right foot 06/17/2015  . Diabetic foot infection 06/16/2015   Scheduled Meds: . ALPRAZolam  0.5 mg Oral TID  . ciprofloxacin  400 mg Intravenous Q24H  . clindamycin (CLEOCIN) IV  600 mg Intravenous 3 times per day  . cyclobenzaprine  10 mg Oral BID  . furosemide  20 mg Oral Daily  . heparin  5,000 Units Subcutaneous 3 times per day  . insulin aspart  0-5 Units Subcutaneous QHS  . insulin aspart  0-9 Units Subcutaneous TID WC  . insulin glargine  10 Units Subcutaneous BID  . metoprolol  50 mg Oral BID  . pantoprazole  80 mg Oral Daily  . sodium chloride  3 mL Intravenous Q12H  . Vilazodone HCl  20 mg Oral Daily   Continuous Infusions: . sodium chloride 125 mL/hr at 06/17/15 0608   PRN Meds:.acetaminophen **OR** acetaminophen, morphine injection, ondansetron (ZOFRAN) IV, oxyCODONE  Allergies  Allergen Reactions  . Daptomycin     Hypotension   . Bactrim [Sulfamethoxazole-Trimethoprim] Other (See Comments)   Cannot take due to kidney issue  . Dapsone Nausea And Vomiting  . Latex Other (See Comments)    Blisters.   . Penicillins Hives  . Vancomycin Itching   Past Surgical History  Procedure Laterality Date  . Wrist arthroscopy      left with no break reapir of tendond and ligamants  . Toe amputation      5th digit right foot-[place]  . Debridement leg      lower leg-left  . Metatarsal head excision Right 02/14/2013    Procedure: 4TH METATARSAL HEAD RESECTION RIGHT FOOT;  Surgeon: Dallas Schimke, DPM;  Location: [facility];  Service: Orthopedics;  Laterality: Right;  latex allergy  . Vitrectomy Right    Family History  Problem Relation Age of Onset  . Cancer Mother     liver lung  . Cancer Father     liver  . Diabetes Mother   . Diabetes Father    Social History:  reports that he has been smoking Cigarettes.  He has a 5 pack-year smoking history. His smokeless tobacco use includes Chew. He reports that he drinks alcohol. He reports that he does not use illicit drugs.  Physical Examination: Vital signs in last 24 hours:   Temp:  [99.6  F (37.6 C)-102.4 F (39.1 C)] 99.6 F (37.6 C) (07/20 0900) Pulse Rate:  [100-114] 101 (07/20 0559) Resp:  [14-20] 14 (07/20 0559) BP: (127-165)/(61-97) 139/68 mmHg (07/20 0559) SpO2:  [95 %-100 %] 95 % (07/20 0559) Weight:  [241 lb 9.6 oz (109.589 kg)-244 lb (110.678 kg)] 241 lb 9.6 oz (109.589 kg) (07/19 1758)  DRESSING:  Intact. INTEGUMENT:  Skin of the right foot is warm.  There is a full-thickness ulceration present along the plantar lateral aspect of the right foot.  The ulceration measured 6.4 cm in length by 6.2 by 0.5 cm in depth. The wound bed is comprised of a rim of granular tissue.  The central aspect of the ulceration is comprised of fibrotic tissue.  There is periwound erythema present extending to the dorsum of the foot.  A new ulceration is present along the plantar aspect of the right foot measuring 1.6 cm in length by 2.5 cm  in width by 3.5 cm in depth.  Purulence was expressed from the new ulcer.  It tracks laterally to the larger ulceration. VASCULAR:   Pedal pulses are palpable.  There is edema of the right lower extremity.  Lab/Test Results:    Recent Labs  06/15/15 1315 06/16/15 1512  WBC 17.7* 22.7*  HGB 9.3* 8.6*  HCT 28.8* 24.9*  PLT 481* 369  NA 133* 127*  K 5.1 4.4  CL 99* 94*  CO2 24 23  BUN 53* 50*  CREATININE 4.26* 4.48*  GLUCOSE 306* 295*  CALCIUM 8.4* 8.2*    Recent Results (from the past 240 hour(s))  Blood culture (routine x 2)     Status: None (Preliminary result)   Collection Time: 06/16/15  2:58 PM  Result Value Ref Range Status   Specimen Description BLOOD LEFT ANTECUBITAL  Final   Special Requests BOTTLES DRAWN AEROBIC AND ANAEROBIC 6CC EACH  Final   Culture NO GROWTH < 24 HOURS  Final   Report Status PENDING  Incomplete  Blood culture (routine x 2)     Status: None (Preliminary result)   Collection Time: 06/16/15  3:12 PM  Result Value Ref Range Status   Specimen Description BLOOD RIGHT ANTECUBITAL  Final   Special Requests   Final    BOTTLES DRAWN AEROBIC AND ANAEROBIC AEB=6CC ANA=5CC   Culture NO GROWTH < 24 HOURS  Final   Report Status PENDING  Incomplete  Culture, blood (routine x 2)     Status: None (Preliminary result)   Collection Time: 06/17/15  7:11 AM  Result Value Ref Range Status   Specimen Description BLOOD  Final   Special Requests BLOOD  Final   Culture NO GROWTH < 12 HOURS  Final   Report Status PENDING  Incomplete  Culture, blood (routine x 2)     Status: None (Preliminary result)   Collection Time: 06/17/15  7:19 AM  Result Value Ref Range Status   Specimen Description BLOOD  Final   Special Requests BLOOD  Final   Culture NO GROWTH < 12 HOURS  Final   Report Status PENDING  Incomplete     Mr Foot Right Wo Contrast  06/17/2015   CLINICAL DATA:  Right foot pain and swelling.  Abnormal x-ray.  Hip  EXAM: MRI OF THE RIGHT FOREFOOT WITHOUT  CONTRAST  TECHNIQUE: Multiplanar, multisequence MR imaging was performed. No intravenous contrast was administered.  COMPARISON:  X-ray right foot 06/16/2015  FINDINGS: There is significant patient motion degrading image quality limiting evaluation.  There is evidence  of fourth and fifth partial metatarsal amputation. There is soft tissue ulceration along the lateral aspect of the left foot overlying the base of the fifth metatarsal. There is soft tissue edema along the lateral aspect of the foot. There is marrow edema within the fifth metatarsal stump with a small adjacent 8 mm fluid collection.  There is no other marrow signal abnormality. 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 There is moderate osteoarthritis of the first MTP joint. There is no acute fracture or dislocation. There is mild T2 signal within the plantar musculature likely neurogenic. There is no other fluid collection or hematoma.  IMPRESSION: Soft tissue ulceration and edema of the lateral aspect of the right foot most concerning for cellulitis. There is marrow edema within the fifth metatarsal stump with a small 8 mm fluid collection adjacent to the lateral cortex. The appearance is most concerning for osteomyelitis.   Electronically Signed   By: Elige Ko   On: 06/17/2015 08:13   Dg Foot Complete Right  06/16/2015   CLINICAL DATA:  Diabetic presenting with right foot swelling and a large blister on the lateral side of the right foot.  EXAM: RIGHT FOOT COMPLETE - 3+ VIEW  COMPARISON:  06/15/2015 and earlier.  FINDINGS: Prior amputation of the 5th ray at the level of the proximal 5th metatarsal. Since yesterday's examination, development of a small amount of gas in the lateral soft tissues overlying the amputated 5th metatarsal. No evidence of underlying osteomyelitis involving the remaining 5th metatarsal. Prior amputation of the head of the 4th metatarsal. No evidence of acute fracture. Severe joint space narrowing and  associated hypertrophic spurring involving the IP joint of the great toe. Remaining joint spaces well preserved. No erosions. Mild osseous demineralization.  IMPRESSION: 1. Soft tissue infection involving the lateral foot with gas in the lateral soft tissues which was not visible yesterday. 2. No evidence of osteomyelitis involving the underlying remaining 5th metatarsal or elsewhere. 3. 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   Electronically Signed   By: Hulan Saas M.D.   On: 06/16/2015 16:10   Dg Foot Complete Right  06/15/2015   CLINICAL DATA:  Diabetic foot ulcer  EXAM: RIGHT FOOT COMPLETE - 3+ VIEW  COMPARISON:  Right foot dated February 14, 2013  FINDINGS: The patient has undergone previous amputation of the distal aspect of the fourth metatarsal. The fourth phalanx remains present. The patient has undergone amputation of the fifth toe as well as the distal half of the fifth metatarsal. There is chronic cortical thickening of the proximal phalanx of the great toe. There are interphalangeal joint osteoarthritic changes diffusely greatest at the IP joint of the great toe. There is mild osteoarthritic change of the first MTP joint. There are no bony changes to suggest osteomyelitis of the calcaneus. Due to the weight-bearing nature of the lateral film the extreme inferior or superficial aspect of the heel pad is excluded from the study.  IMPRESSION: There are extensive chronic changes involving the fourth metatarsal and there are post amputation changes of the fifth toe and distal portion of the fifth metatarsal. No definite changes of osteomyelitis of the calcaneus are observed.   Electronically Signed   By: David  [place] M.D.   On: 06/15/2015 14:01    Assessment: 1.  Cellulitis, right foot. 2.  Osteomyelitis of the fifth metatarsal, right foot. 3.  Nonhealing diabetic ulceration, right foot.  Plan: 1.  Case discussed with Dr. Sherrie Mustache. 2.  Continue IV antibiotics per  primary service. 3.  A wet to dry dressing was applied to the right foot.  I will change this daily. 4.  NWB of right foot with bathroom privileges    5.  Nephrology consult pending. 6.  Debridement of skin, subcutaneous tissue, muscle and bone with [employer] Wound VAC application tentatively scheduled for Friday morning.   Haedyn Ancrum IVAN 06/17/2015, 10:40 AM

## 2015-06-17 NOTE — Progress Notes (Signed)
[facility] PROGRESS NOTE  Olga CoasterJohn S Finlay ZOX:[id number] DOB: 02/23/1976 DOA: 06/16/2015 PCP: PROVIDER NOT IN SYSTEM    Code Status: Full code Family Communication: Discussed with patient; family not available. Disposition Plan: Discharge when clinically appropriate   Consultants:  Podiatry  Procedures:  None  Antibiotics:  Cipro 7/20>>  Clindamycin 7/19>>  HPI/Subjective: Patient feels a little better. He has no complaints of headache, chest pain, nausea, vomiting, diarrhea, or foot pain at this time. He is concerned about the amount of fluids he is receiving.  Objective: Filed Vitals:   06/17/15 0900  BP:   Pulse:   Temp: 99.6 F (37.6 C)  Resp:    maximum temperature 102.4. Current temperature 99.6. Respiratory rate 14. Blood pressure 139/68. Oxygen saturation 95% on room air.  Intake/Output Summary (Last 24 hours) at 06/17/15 1022 Last data filed at 06/17/15 0900  Gross per 24 hour  Intake 1262.92 ml  Output      0 ml  Net 1262.92 ml   Filed Weights   06/16/15 1419 06/16/15 1758  Weight: 110.678 kg (244 lb) 109.589 kg (241 lb 9.6 oz)    Exam:   General:  39 year old Caucasian man in no acute distress.  Cardiovascular: S1, S2, no murmurs rubs or gallops.  Respiratory: Decreased breath sounds in the bases, otherwise clear.  Abdomen: Positive bowel sounds, soft, nontender, nondistended.  Musculoskeletal/extremities: Bandage on the right leg/foot (not taken off). 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 Left leg with no edema and barely palpable pedal pulse.  Neurologic: He is alert and oriented 3. Cranial nerves II through XII are intact.  Data Reviewed: Basic Metabolic Panel:  Recent Labs Lab 06/15/15 1315 06/16/15 1512  NA 133* 127*  K 5.1 4.4  CL 99* 94*  CO2 24 23  GLUCOSE 306* 295*  BUN 53* 50*  CREATININE 4.26* 4.48*  CALCIUM 8.4* 8.2*    Liver Function Tests:  Recent Labs Lab 06/15/15 1315 06/16/15 1512  AST 21 18  ALT 20 20  ALKPHOS 123 112  BILITOT 0.4 0.4  PROT 7.1 7.3  ALBUMIN 2.0* 2.0*   No results for input(s): LIPASE, AMYLASE in the last 168 hours. No results for input(s): AMMONIA in the last 168 hours. CBC:  Recent Labs Lab 06/15/15 1315 06/16/15 1512  WBC 17.7* 22.7*  NEUTROABS 14.1*  --   HGB 9.3* 8.6*  HCT 28.8* 24.9*  MCV 87.5 84.7  PLT 481* 369   Cardiac Enzymes: No results for input(s): CKTOTAL, CKMB, CKMBINDEX, TROPONINI in the last 168 hours. BNP (last 3 results) No results for input(s): BNP in the last 8760 hours.  ProBNP (last 3 results) No results for input(s): PROBNP in the last 8760 hours.  CBG:  Recent Labs Lab 06/16/15 1727 06/16/15 2101 06/17/15 0803  GLUCAP 209* 186* 136*    Recent Results (from the past 240 hour(s))  Blood culture (routine x 2)     Status: None (Preliminary result)   Collection Time: 06/16/15  2:58 PM  Result Value Ref Range Status   Specimen Description BLOOD LEFT ANTECUBITAL  Final   Special Requests BOTTLES DRAWN AEROBIC AND ANAEROBIC 6CC EACH  Final   Culture NO GROWTH < 24 HOURS  Final   Report Status PENDING  Incomplete  Blood culture (routine x 2)     Status: None (Preliminary result)   Collection Time: 06/16/15  3:12 PM  Result Value Ref Range  Status   Specimen Description BLOOD RIGHT ANTECUBITAL  Final   Special Requests   Final    BOTTLES DRAWN AEROBIC AND ANAEROBIC AEB=6CC ANA=5CC   Culture NO GROWTH < 24 HOURS  Final   Report Status PENDING  Incomplete  Culture, blood (routine x 2)     Status: None (Preliminary result)   Collection Time: 06/17/15  7:11 AM  Result Value Ref Range Status   Specimen Description BLOOD  Final   Special Requests BLOOD  Final   Culture NO GROWTH < 12 HOURS  Final   Report Status PENDING  Incomplete  Culture, blood (routine x 2)     Status: None (Preliminary result)   Collection Time: 06/17/15   7:19 AM  Result Value Ref Range Status   Specimen Description BLOOD  Final   Special Requests BLOOD  Final   Culture NO GROWTH < 12 HOURS  Final   Report Status PENDING  Incomplete     Studies: Mr Foot Right Wo Contrast  06/17/2015   CLINICAL DATA:  Right foot pain and swelling.  Abnormal x-ray.  Hip  EXAM: MRI OF THE RIGHT FOREFOOT WITHOUT CONTRAST  TECHNIQUE: Multiplanar, multisequence MR imaging was performed. No intravenous contrast was administered.  COMPARISON:  X-ray right foot 06/16/2015  FINDINGS: There is significant patient motion degrading image quality limiting evaluation.  There is evidence of fourth and fifth partial metatarsal amputation. There is soft tissue ulceration along the lateral aspect of the left foot overlying the base of the fifth metatarsal. There is soft tissue edema along the lateral aspect of the foot. There is marrow edema within the fifth metatarsal stump with a small adjacent 8 mm fluid collection.  There is no other marrow signal abnormality. 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 There is moderate osteoarthritis of the first MTP joint. There is no acute fracture or dislocation. There is mild T2 signal within the plantar musculature likely neurogenic. There is no other fluid collection or hematoma.  IMPRESSION: Soft tissue ulceration and edema of the lateral aspect of the right foot most concerning for cellulitis. There is marrow edema within the fifth metatarsal stump with a small 8 mm fluid collection adjacent to the lateral cortex. The appearance is most concerning for osteomyelitis.   Electronically Signed   By: Elige Ko   On: 06/17/2015 08:13   Dg Foot Complete Right  06/16/2015   CLINICAL DATA:  Diabetic presenting with right foot swelling and a large blister on the lateral side of the right foot.  EXAM: RIGHT FOOT COMPLETE - 3+ VIEW  COMPARISON:  06/15/2015 and earlier.  FINDINGS: Prior amputation of the 5th ray at the level of the proximal  5th metatarsal. Since yesterday's examination, development of a small amount of gas in the lateral soft tissues overlying the amputated 5th metatarsal. No evidence of underlying osteomyelitis involving the remaining 5th metatarsal. Prior amputation of the head of the 4th metatarsal. No evidence of acute fracture. Severe joint space narrowing and associated hypertrophic spurring involving the IP joint of the great toe. Remaining joint spaces well preserved. No erosions. Mild osseous demineralization.  IMPRESSION: 1. Soft tissue infection involving the lateral foot with gas in the lateral soft tissues which was not visible yesterday. 2. No evidence of osteomyelitis involving the underlying remaining 5th metatarsal or elsewhere. 3. 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   Electronically Signed   By: Hulan Saas M.D.   On: 06/16/2015 16:10  Dg Foot Complete Right  06/15/2015   CLINICAL DATA:  Diabetic foot ulcer  EXAM: RIGHT FOOT COMPLETE - 3+ VIEW  COMPARISON:  Right foot dated February 14, 2013  FINDINGS: The patient has undergone previous amputation of the distal aspect of the fourth metatarsal. The fourth phalanx remains present. The patient has undergone amputation of the fifth toe as well as the distal half of the fifth metatarsal. There is chronic cortical thickening of the proximal phalanx of the great toe. There are interphalangeal joint osteoarthritic changes diffusely greatest at the IP joint of the great toe. There is mild osteoarthritic change of the first MTP joint. There are no bony changes to suggest osteomyelitis of the calcaneus. Due to the weight-bearing nature of the lateral film the extreme inferior or superficial aspect of the heel pad is excluded from the study.  IMPRESSION: There are extensive chronic changes involving the fourth metatarsal and there are post amputation changes of the fifth toe and distal portion of the fifth metatarsal. No definite changes of  osteomyelitis of the calcaneus are observed.   Electronically Signed   By: David  [place] M.D.   On: 06/15/2015 14:01    Scheduled Meds: . ALPRAZolam  0.5 mg Oral TID  . ciprofloxacin  400 mg Intravenous Q24H  . clindamycin (CLEOCIN) IV  600 mg Intravenous 3 times per day  . cyclobenzaprine  10 mg Oral BID  . furosemide  20 mg Oral Daily  . heparin  5,000 Units Subcutaneous 3 times per day  . insulin aspart  0-20 Units Subcutaneous TID WC  . insulin aspart  0-5 Units Subcutaneous QHS  . metoprolol  50 mg Oral BID  . pantoprazole  80 mg Oral Daily  . sodium chloride  3 mL Intravenous Q12H  . Vilazodone HCl  20 mg Oral Daily   Continuous Infusions: . sodium chloride 125 mL/hr at 06/17/15 0608    Assessment and plan:  Principal Problem:   Sepsis Active Problems:   Diabetic foot infection   Osteomyelitis of right foot   Acute renal failure superimposed on stage 4 chronic kidney disease   Diabetes mellitus with complication   Lower extremity edema   Hyponatremia   Anemia, chronic disease   Essential hypertension   Depression with anxiety   GERD (gastroesophageal reflux disease)    1. Sepsis secondary to right diabetic foot infection/abscess/osteomyelitis. On admission, the patient was febrile with a temperature 101, tachycardic with a heart rate of 110, and with an obvious infected foot wound. X-ray of his right foot revealed soft tissue infection involving the lateral foot with gas visible; no osteomyelitis seen. However, follow-up MRI revealed evidence of soft tissue ulceration/edema of the lateral right foot consistent with cellulitis and marrow edema within the fifth metatarsal stop within 8 mm fluid collection an appearance concerning for osteomyelitis. His edema is likely secondary to the infection. -The patient was started on clindamycin, given his multiple anabolic allergies. -As added Cipro for additional gram-negative coverage. -Dr. [name] was consulted and applied  dressing to the right foot prior to the revelation of the MRI results. Will await his follow-up recommendations. -Follow-up W BC pending.  Acute renal failure superimposed on stage IV chronic kidney disease. Patient reported that his baseline creatinine ranges from the upper 3s to the lower 4s. He is followed by nephrology in [place]. Per his history, he is being considered for a renal transplant at [facility]. He and his nephrologist are trying to hold off on dialysis pending being listed  for the transplantation. Patient states that he still makes urine. -Patient was started on vigorous IV fluids, with no significant improvement in his creatinine. -Strict I's and O's and daily weights. -We will decrease IV fluid rate.  -Continue to hold lisinopril. -Will consult nephrology notn-emergently. -Follow-up renal function test pending.  Hyponatremia. Patient's serum sodium was 133 on admission. Following vigorous IV fluids, it has decreased. -Renal function test pending for follow-up. -Will decrease rate of IV fluids.  Insulin-dependent diabetes mellitus with nephropathy, neuropathy, and PVD. The patient is treated chronically with sliding scale NovoLog and Lantus. His hemoglobin A1c was 10.1 indicating suboptimal outpatient control. Since admission, his CBGs have decreased substantially, so I have decreased the sliding scale NovoLog to sensitive scale to avoid symptomatic hypoglycemia and will start Lantus at a lower dose of 10 units twice a day.  Essential hypertension. Patient is treated with lisinopril, Lasix and metoprolol chronically. Lisinopril is being held, given his worsening renal function. We'll continue metoprolol and Lasix as ordered. His blood pressure is reasonable.  Normocytic anemia, secondary to chronic disease and iron deficiency. Patient's hemoglobin was 9.3 on admission. With IV fluid hydration, it has decreased some. His anemia panel revealed a total iron of 6, TIBC 143,  ferritin 236, folate 25, and vitamin B12 520. -We will add ferrous sulfate 325 mg daily initially.  Depression with anxiety. Currently stable. Will continue chronic medications.      Time spent: 35 minutes   [facility]  [facility] Pager [phone]. If 7PM-7AM, please contact night-coverage at [email or web address], password [facility] 06/17/2015, 10:22 AM

## 2015-06-18 DIAGNOSIS — R7881 Bacteremia: Secondary | ICD-10-CM | POA: Diagnosis present

## 2015-06-18 LAB — CBC WITH DIFFERENTIAL/PLATELET
Basophils Absolute: 0.1 10*3/uL (ref 0.0–0.1)
Basophils Relative: 1 % (ref 0–1)
Eosinophils Absolute: 0.6 10*3/uL (ref 0.0–0.7)
Eosinophils Relative: 5 % (ref 0–5)
HCT: 29.2 % — ABNORMAL LOW (ref 39.0–52.0)
Hemoglobin: 10.1 g/dL — ABNORMAL LOW (ref 13.0–17.0)
Lymphocytes Relative: 21 % (ref 12–46)
Lymphs Abs: 2.2 10*3/uL (ref 0.7–4.0)
MCH: 29.3 pg (ref 26.0–34.0)
MCHC: 34.6 g/dL (ref 30.0–36.0)
MCV: 84.6 fL (ref 78.0–100.0)
Monocytes Absolute: 1.1 10*3/uL — ABNORMAL HIGH (ref 0.1–1.0)
Monocytes Relative: 10 % (ref 3–12)
Neutro Abs: 6.7 10*3/uL (ref 1.7–7.7)
Neutrophils Relative %: 63 % (ref 43–77)
Platelets: 323 10*3/uL (ref 150–400)
RBC: 3.45 MIL/uL — ABNORMAL LOW (ref 4.22–5.81)
RDW: 13.6 % (ref 11.5–15.5)
WBC: 10.7 10*3/uL — ABNORMAL HIGH (ref 4.0–10.5)

## 2015-06-18 LAB — CBC
HEMATOCRIT: 23 % — AB (ref 39.0–52.0)
HEMOGLOBIN: 7.7 g/dL — AB (ref 13.0–17.0)
MCH: 28.7 pg (ref 26.0–34.0)
MCHC: 33.5 g/dL (ref 30.0–36.0)
MCV: 85.8 fL (ref 78.0–100.0)
Platelets: 339 10*3/uL (ref 150–400)
RBC: 2.68 MIL/uL — AB (ref 4.22–5.81)
RDW: 13.5 % (ref 11.5–15.5)
WBC: 11.4 10*3/uL — ABNORMAL HIGH (ref 4.0–10.5)

## 2015-06-18 LAB — COMPREHENSIVE METABOLIC PANEL
ALK PHOS: 113 U/L (ref 38–126)
ALT: 20 U/L (ref 17–63)
AST: 16 U/L (ref 15–41)
Albumin: 1.7 g/dL — ABNORMAL LOW (ref 3.5–5.0)
Anion gap: 8 (ref 5–15)
BILIRUBIN TOTAL: 0.4 mg/dL (ref 0.3–1.2)
BUN: 47 mg/dL — ABNORMAL HIGH (ref 6–20)
CO2: 21 mmol/L — ABNORMAL LOW (ref 22–32)
Calcium: 7.9 mg/dL — ABNORMAL LOW (ref 8.9–10.3)
Chloride: 102 mmol/L (ref 101–111)
Creatinine, Ser: 4.38 mg/dL — ABNORMAL HIGH (ref 0.61–1.24)
GFR, EST AFRICAN AMERICAN: 18 mL/min — AB (ref >60)
GFR, EST NON AFRICAN AMERICAN: 16 mL/min — AB (ref >60)
GLUCOSE: 148 mg/dL — AB (ref 65–99)
POTASSIUM: 3.8 mmol/L (ref 3.5–5.1)
Sodium: 131 mmol/L — ABNORMAL LOW (ref 135–145)
TOTAL PROTEIN: 7 g/dL (ref 6.5–8.1)

## 2015-06-18 LAB — GLUCOSE, CAPILLARY
Glucose-Capillary: 154 mg/dL — ABNORMAL HIGH (ref 65–99)
Glucose-Capillary: 212 mg/dL — ABNORMAL HIGH (ref 65–99)
Glucose-Capillary: 203 mg/dL — ABNORMAL HIGH (ref 65–99)
Glucose-Capillary: 207 mg/dL — ABNORMAL HIGH (ref 65–99)

## 2015-06-18 LAB — PREPARE RBC (CROSSMATCH)

## 2015-06-18 LAB — PHOSPHORUS: Phosphorus: 5.3 mg/dL — ABNORMAL HIGH (ref 2.5–4.6)

## 2015-06-18 LAB — ABO/RH: ABO/RH(D): O NEG

## 2015-06-18 MED ORDER — HYDRALAZINE HCL 20 MG/ML IJ SOLN: 5.0000 mg | INTRAMUSCULAR | Status: DC | PRN

## 2015-06-18 MED ORDER — INSULIN GLARGINE 100 UNIT/ML ~~LOC~~ SOLN
15.0000 [IU] | Freq: Two times a day (BID) | SUBCUTANEOUS | Status: DC
  Administered 2015-06-19 – 2015-06-20 (×2): 15 [IU] via SUBCUTANEOUS
  Filled 2015-06-18 (×8): qty 0.15

## 2015-06-18 MED ORDER — FUROSEMIDE 10 MG/ML IJ SOLN
20.0000 mg | Freq: Once | INTRAMUSCULAR | Status: AC
  Administered 2015-06-18: 20 mg via INTRAVENOUS
  Filled 2015-06-18: qty 2

## 2015-06-18 MED ORDER — SODIUM CHLORIDE 0.9 % IV SOLN
Freq: Once | INTRAVENOUS | Status: AC
  Administered 2015-06-18: 15:00:00 via INTRAVENOUS

## 2015-06-18 MED ORDER — INSULIN ASPART 100 UNIT/ML ~~LOC~~ SOLN
4.0000 [IU] | Freq: Three times a day (TID) | SUBCUTANEOUS | Status: DC
  Administered 2015-06-18 – 2015-06-20 (×3): 4 [IU] via SUBCUTANEOUS

## 2015-06-18 NOTE — Progress Notes (Signed)
Reinforced dressing with gauze and ace wrap replaced.

## 2015-06-18 NOTE — Progress Notes (Signed)
Inpatient Diabetes Program Recommendations  AACE/ADA: New Consensus Statement on Inpatient Glycemic Control (2013)  Target Ranges:  Prepandial:   less than 140 mg/dL      Peak postprandial:   less than 180 mg/dL (1-2 hours)      Critically ill patients:  140 - 180 mg/dL   Results for Maurice [name], Maurice [name] (MRN [id number]) as of 06/18/2015 13:29  Ref. Range 06/17/2015 08:03 06/17/2015 11:31 06/17/2015 16:20 06/17/2015 21:04 06/18/2015 07:41 06/18/2015 11:12  Glucose-Capillary Latest Ref Range: 65-99 mg/dL 409 (H) 811 (H) 914 (H) 263 (H) 154 (H) 212 (H)   Results for Maurice [name] (MRN [id number]) as of 06/18/2015 13:29  Ref. Range 06/16/2015 16:46  Hemoglobin A1C Latest Ref Range: 4.8-5.6 % 10.1 (H)    Outpatient Diabetes medications: Lantus 42 units QHS, Novolog 0-75 units per day (uses correction of 1:20 or 1:30 (depending on activity for the day) and uses carb coverage of 1:15 or 1:20 (1 unit for 15 -20 grams of carbs) Current orders for Inpatient glycemic control: Lantus 10 units BID, Novolog 0-15 units TID with meals, Novolog 0-5 units HS  Inpatient Diabetes Program Recommendations Insulin - Meal Coverage: Please consider ordering Novolog 4 units TID with meals for meal coverage. HgbA1C: A1C 10.1% on 06/16/15.  Note: Spoke with patient about diabetes and home regimen for diabetes control. Patient reports that he currently he takes Lantus 42 units QHS, Novolog 0-75 units per day (uses correction of 1:20 or 1:30 (depending on activity for the day) and uses carb coverage of 1:15 or 1:20 (1 unit for 15 -20 grams of carbs) as an outpatient for diabetes control. He takes Novolog with meals for correction and carb coverage and additionally for correction when needed in between meals if his glucose is running high. Patient reports that he checks his glucose 3-7 times per day (before meals and at bedtime and additionally when glucose is running higher. Patient states that he use to be followed by a doctor at [facility] and during that time he took several classes on diabetes education and learned how to count carbohydrates and use correction factors for improve glycemic control.  Inquired about prior A1C and patient reports that his last A1C was 9.1% about one month ago but the 2 A1C values prior to that were 6.3% and 6.6%. Discussed A1C results (10.1% on 06/16/15) and explained that current A1C is equivalent to an average glucose of 240 mg/dl. In talking with the patient, he reports that he was taking a 6 week course of Prednisone which was completed 4-5 weeks ago. Anticipate current A1C and A1C of 9.1% one month ago were significantly impacted from glycemic control during prednisone use. Discussed impact of nutrition, exercise, stress, sickness (infection), and medications (steroids) on diabetes control.  Patient expressed concern that he was not being given his usual insulin dosages as an inpatient. Explained that activity and diet are likely more restricted while he is in the hospital. Also informed patient that glucose was being monitored closely and MD was making adjustments as needed. Glucose has ranged from 136-263 mg/dl over the past 30 hours.   Patient verbalized understanding of information discussed and he states that he has no further questions at this time related to diabetes. Patient appears to be very knowledgeable about diabetes, carb counting, and using a correction factor. Patient would likely benefit from receiving Novolog meal coverage as an inpatient, as requested above.  Thanks, Orlando Penner, RN, MSN, CCRN, CDE Diabetes Coordinator Inpatient Diabetes Program [phone]  Team Pager) [phone] ([facility] office) [phone] [place] [facility] office) [phone] [facility] office)

## 2015-06-18 NOTE — Progress Notes (Addendum)
Subjective: Patient states that he is feeling much better. He denies any difficulty breathing. His appetite is good and no nausea or vomiting.   Objective: Vital signs in last 24 hours: Temp:  [98.3 F (36.8 C)-103.2 F (39.6 C)] 98.3 F (36.8 C) (07/21 0500) Pulse Rate:  [81-109] 81 (07/21 0500) Resp:  [16] 16 (07/21 0500) BP: (135-183)/(66-89) 135/73 mmHg (07/21 0500) SpO2:  [99 %-100 %] 99 % (07/21 0500) Weight:  [241 lb 14.4 oz (109.725 kg)] 241 lb 14.4 oz (109.725 kg) (07/21 0500)  Intake/Output from previous day: 07/20 0701 - 07/21 0700 In: 2649.2 [P.O.:960; I.V.:1339.2; IV Piggyback:350] Out: -  Intake/Output this shift:     Recent Labs  06/15/15 1315 06/16/15 1512 06/17/15 0711 06/18/15 0516  HGB 9.3* 8.6* 7.8* 7.7*    Recent Labs  06/17/15 0711 06/18/15 0516  WBC 14.2* 11.4*  RBC 2.72*  2.71* 2.68*  HCT 23.3* 23.0*  PLT 333 339    Recent Labs  06/17/15 0711 06/18/15 0516  NA 130* 131*  K 4.1 3.8  CL 101 102  CO2 20* 21*  BUN 45* 47*  CREATININE 4.20* 4.38*  GLUCOSE 140* 148*  CALCIUM 7.9* 7.9*    Recent Labs  06/16/15 1512  INR 1.18    Generally patient is alert and in no apparent distress Chest is clear to auscultation His heart exam regular rate and rhythm no murmur Extremities he has 1+ edema on the right and trace on the left.  Assessment/Plan: Renal failure: At this moment seems to be chronic. 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 Patient has been referred to [facility] for evaluation of kidney transplant. Presently he doesn't have any uremic signs and symptoms and his potassium is good. Ultrasound of the kidneys showed right kidney to be 13.7 left kidney 14.2. There is no hydronephrosis. Problem #2 diabetes Problem #3 anemia: His hemoglobin is declining. At this moment possibly a combination of iron deficiency anemia and anemia of chronic disease. Problem #4 diabetes Problem #5  cellulitis of right foot. Patient is on antibiotics He is a febrile. His white blood cell count is improving. Problem #6 metabolic bone disease: His calcium is in range. Problem #7 hypertension: His blood pressure is reasonably controlled Problem #8 metabolic acidosis: Patient is on sodium bicarbonate and his CO2 is improving Plan: 1] We'll continue his present management 2] since patient has previous workup and being followed by Dr. Felipa Furnace nephrologist at this moment no further workup is required. 3] we'll check iron studies tomorrow.   Maurice [name] S 06/18/2015, 8:33 AM

## 2015-06-18 NOTE — Progress Notes (Signed)
Podiatry Progress Note  Subjective: Maurice [name] is a 39 y.o. male being followed for an infected diabetic ulceration of his right foot.  He has received one unit of blood.  He is awaiting a second unit.  He feels better today.  He denies any current nausea, vomiting or chills.    Past Medical History  Diagnosis Date  . PVC (premature ventricular contraction)   . PAC (premature atrial contraction)   . Diabetes mellitus without complication   . Anxiety   . Depression   . Blood clot in vein     pt sts" it may have been in artery and not vein" of right leg  . GERD (gastroesophageal reflux disease)   . H/O hiatal hernia   . Barrett's esophagus   . Arthritis   . Neuropathy   . Herniated disc, cervical     c5 and c6 and c6 and c7  . Hypertension   . Kidney failure     stage 4 just dx 4 months ago.Sees Dr Elby Beck, nephrologist in [place] [phone].  .  . 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 Diabetic retinopathy of both eyes, associated with type 2 diabetes mellitus   . Osteomyelitis of right foot 06/17/2015  . Diabetic foot infection 06/16/2015   Scheduled Meds: . ALPRAZolam  0.5 mg Oral TID  . ciprofloxacin  400 mg Intravenous Q24H  . clindamycin (CLEOCIN) IV  600 mg Intravenous 3 times per day  . cyclobenzaprine  10 mg Oral BID  . ferrous sulfate  325 mg Oral Q breakfast  . furosemide  20 mg Oral Daily  . heparin  5,000 Units Subcutaneous 3 times per day  . insulin aspart  0-15 Units Subcutaneous TID WC  . insulin aspart  0-5 Units Subcutaneous QHS  . insulin aspart  4 Units Subcutaneous TID WC  . insulin glargine  15 Units Subcutaneous BID  . metoprolol  50 mg Oral BID  . pantoprazole  80 mg Oral Daily  . sodium bicarbonate  650 mg Oral TID  . sodium chloride  3 mL Intravenous Q12H  . Vilazodone HCl  20 mg Oral QHS   Continuous Infusions: . sodium chloride 50 mL/hr at 06/17/15 2311   PRN Meds:.acetaminophen **OR** acetaminophen,  hydrALAZINE, morphine injection, ondansetron (ZOFRAN) IV, oxyCODONE  Allergies  Allergen Reactions  . Daptomycin     Hypotension   . Bactrim [Sulfamethoxazole-Trimethoprim] Other (See Comments)    Cannot take due to kidney issue  . Dapsone Nausea And Vomiting  . Latex Other (See Comments)    Blisters.   . Penicillins Hives  . Vancomycin Itching   Past Surgical History  Procedure Laterality Date  . Wrist arthroscopy      left with no break reapir of tendond and ligamants  . Toe amputation      5th digit right foot-[place]  . Debridement leg      lower leg-left  . Metatarsal head excision Right 02/14/2013    Procedure: 4TH METATARSAL HEAD RESECTION RIGHT FOOT;  Surgeon: Dallas Schimke, DPM;  Location: [facility];  Service: Orthopedics;  Laterality: Right;  latex allergy  . Vitrectomy Right    Family History  Problem Relation Age of Onset  . Cancer Mother     liver lung  . Cancer Father     liver  . Diabetes Mother   . Diabetes Father    Social History:  reports that he has  been smoking Cigarettes.  He has a 5 pack-year smoking history. His smokeless tobacco use includes Chew. He reports that he drinks alcohol. He reports that he does not use illicit drugs.  Physical Examination: Vital signs in last 24 hours:   Temp:  [98.3 F (36.8 C)-99.5 F (37.5 C)] 98.7 F (37.1 C) (07/21 1717) Pulse Rate:  [73-93] 83 (07/21 1717) Resp:  [16-18] 18 (07/21 1717) BP: (133-165)/(71-86) 165/86 mmHg (07/21 1717) SpO2:  [99 %-100 %] 99 % (07/21 1717) Weight:  [241 lb 14.4 oz (109.725 kg)] 241 lb 14.4 oz (109.725 kg) (07/21 0500)  DRESSING:  Intact. INTEGUMENT:  Skin of the right foot is warm.  There is a full-thickness ulceration present along the plantar lateral aspect of the right foot.  The ulceration measured 6.4 cm in length by 6.2 by 0.5 cm in depth. The wound bed is comprised of a rim of granular tissue.  The central aspect of the ulceration is comprised of fibrotic and  necrotic tissue.  There is periwound erythema present extending to the dorsum of the foot.  The ulceration present along the plantar aspect of the right foot measuring 1.6 cm in length by 2.5 cm in width by 3.5 cm in depth.  A third ulceration is present along the dorsal aspect of the fourth interspace.  Purulence was expressed from the new ulcer.  It measured 0.6 cm in length by 0.5 cm in width.  It is continuous with the plantar ulceration. VASCULAR:   Pedal pulses are palpable.  There is edema of the right lower extremity.  Lab/Test Results:    Recent Labs  06/17/15 0711 06/18/15 0516  WBC 14.2* 11.4*  HGB 7.8* 7.7*  HCT 23.3* 23.0*  PLT 333 339  NA 130* 131*  K 4.1 3.8  CL 101 102  CO2 20* 21*  BUN 45* 47*  CREATININE 4.20* 4.38*  GLUCOSE 140* 148*  CALCIUM 7.9* 7.9*    Recent Results (from the past 240 hour(s))  Blood culture (routine x 2)     Status: None (Preliminary result)   Collection Time: 06/16/15  2:58 PM  Result Value Ref Range Status   Specimen Description BLOOD LEFT ANTECUBITAL  Final   Special Requests BOTTLES DRAWN AEROBIC AND ANAEROBIC 6CC EACH  Final   Culture NO GROWTH 2 DAYS  Final   Report Status PENDING  Incomplete  Blood culture (routine x 2)     Status: None (Preliminary result)   Collection Time: 06/16/15  3:12 PM  Result Value Ref Range Status   Specimen Description BLOOD RIGHT ANTECUBITAL  Final   Special Requests   Final    BOTTLES DRAWN AEROBIC AND ANAEROBIC AEB=6CC ANA=5CC   Culture  Setup Time   Final    GRAM POSITIVE COCCI IN CHAINS FROM THE ANAEROBIC BOTTLE Gram Stain Report Called to,Read Back By and Verified With: MAYS,J. AT 1742 ON 06/17/2015 BY BAUGHAM,M. Performed at [facility]    Culture   Final    GROUP B STREP(S.AGALACTIAE)ISOLATED Performed at [facility]    Report Status PENDING  Incomplete  Culture, blood (routine x 2)     Status: None (Preliminary result)   Collection Time: 06/17/15  7:11 AM  Result Value  Ref Range Status   Specimen Description BLOOD LEFT ARM  Final   Special Requests BOTTLES DRAWN AEROBIC AND ANAEROBIC 10CC  Final   Culture NO GROWTH 1 DAY  Final   Report Status PENDING  Incomplete  Culture, blood (routine  x 2)     Status: None (Preliminary result)   Collection Time: 06/17/15  7:19 AM  Result Value Ref Range Status   Specimen Description BLOOD RIGHT HAND  Final   Special Requests BOTTLES DRAWN AEROBIC AND ANAEROBIC 10CC  Final   Culture NO GROWTH 1 DAY  Final   Report Status PENDING  Incomplete     US Renal  06/17/2015   CLINICAL DATA:  ATN. Chronic kidney disease stage 4 or 5. Diabetes.  EXAM: RENAL / URINARY TRACT ULTRASOUND COMPLETE  COMPARISON:  None.  FINDINGS: Right Kidney:  Length: 13.7 cm. Echogenicity within normal limits. No mass or hydronephrosis visualized.  Left Kidney:  Length: 14.2 cm. Echogenicity within normal limits. No mass or hydronephrosis visualized.  Bladder:  Appears normal for degree of bladder distention. Ureteral jets not visualized.  IMPRESSION: Normal size kidneys without evidence of hydronephrosis.   Electronically Signed   By: Elberta Fortis M.D.   On: 06/17/2015 16:23   Mr Foot Right Wo Contrast  06/17/2015   CLINICAL DATA:  Right foot pain and swelling.  Abnormal x-ray.  Hip  EXAM: MRI OF THE RIGHT FOREFOOT WITHOUT CONTRAST  TECHNIQUE: Multiplanar, multisequence MR imaging was performed. No intravenous contrast was administered.  COMPARISON:  X-ray right foot 06/16/2015  FINDINGS: There is significant patient motion degrading image quality limiting evaluation.  There is evidence of fourth and fifth partial metatarsal amputation. There is soft tissue ulceration along the lateral aspect of the left foot overlying the base of the fifth metatarsal. There is soft tissue edema along the lateral aspect of the foot. There is marrow edema within the fifth metatarsal stump with a small adjacent 8 mm fluid collection.  There is no other marrow signal  abnormality. 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 There is moderate osteoarthritis of the first MTP joint. There is no acute fracture or dislocation. There is mild T2 signal within the plantar musculature likely neurogenic. There is no other fluid collection or hematoma.  IMPRESSION: Soft tissue ulceration and edema of the lateral aspect of the right foot most concerning for cellulitis. There is marrow edema within the fifth metatarsal stump with a small 8 mm fluid collection adjacent to the lateral cortex. The appearance is most concerning for osteomyelitis.   Electronically Signed   By: Elige Ko   On: 06/17/2015 08:13    Assessment: 1.  Cellulitis, right foot. 2.  Osteomyelitis of the fifth metatarsal, right foot. 3.  Nonhealing diabetic ulceration, right foot.  Plan: 1.  Continue IV antibiotics per primary service. 2.  A wet to dry dressing was applied to the right foot.  I will change this daily. 3.  NWB of right foot with bathroom privileges    4.  Debridement of skin, subcutaneous tissue, muscle and bone with [employer] Wound VAC application planned for Friday morning. 5.  The benefits and risks of the procedure have been explained to the patient and his wife. 6.  A written consent will be obtained. 7.  Hold heparin. 8.  NPO after midnight.   Philis Doke IVAN 06/18/2015, 6:21 PM

## 2015-06-18 NOTE — Progress Notes (Signed)
[facility] PROGRESS NOTE  Maurice [name] JXB:[id number] DOB: [birth year] DOA: 06/16/2015 PCP: PROVIDER NOT IN SYSTEM    Code Status: Full code Family Communication: Discussed with sister on 7/20. Disposition Plan: Discharge when clinically appropriate   Consultants:  Podiatry  Nephrology  Procedures:  None  Antibiotics:  Cipro 7/20>>  Clindamycin 7/19>>  HPI/Subjective: Patient feels better than yesterday, now that the fever has subsided. He has minimal pain in his right foot which is relieved with pain medication. He denies headache, chest pain, or shortness of breath. He denies nausea or vomiting. He denies black tarry stools or bright red blood per rectum.  Objective: Filed Vitals:   06/18/15 0500  BP: 135/73  Pulse: 81  Temp: 98.3 F (36.8 C)  Resp: 16    Intake/Output Summary (Last 24 hours) at 06/18/15 1122 Last data filed at 06/18/15 0825  Gross per 24 hour  Intake 2409.17 ml  Output      0 ml  Net 2409.17 ml   Filed Weights   06/16/15 1419 06/16/15 1758 06/18/15 0500  Weight: 110.678 kg (244 lb) 109.589 kg (241 lb 9.6 oz) 109.725 kg (241 lb 14.4 oz)    Exam:   General:  39 year old Caucasian man in no acute distress.  Cardiovascular: S1, S2, no murmurs rubs or gallops.  Respiratory: Decreased breath sounds in the bases, otherwise clear.  Abdomen: Positive bowel sounds, soft, nontender, nondistended.  Musculoskeletal/extremities: Bandage on the right leg/foot (not taken off). Per exam yesterday, lateral plantar surface of the right leg revealed large ulceration with black eschar and surrounding fibrotic tissue; periwound edema extending to the dorsum of the foot; global bogginess; new ulceration along the plantar aspect; mildly purulent drainage, mildly malodorous; overall decrease in pretibial edema.  Left leg with no edema and barely palpable pedal pulse.  Neurologic: He is alert and oriented 3. Cranial nerves II through XII are  intact.  Data Reviewed: Basic Metabolic Panel:  Recent Labs Lab 06/15/15 1315 06/16/15 1512 06/17/15 0711 06/18/15 0516  NA 133* 127* 130* 131*  K 5.1 4.4 4.1 3.8  CL 99* 94* 101 102  CO2 24 23 20* 21*  GLUCOSE 306* 295* 140* 148*  BUN 53* 50* 45* 47*  CREATININE 4.26* 4.48* 4.20* 4.38*  CALCIUM 8.4* 8.2* 7.9* 7.9*  PHOS  --   --   --  5.3*   Liver Function Tests:  Recent Labs Lab 06/15/15 1315 06/16/15 1512 06/18/15 0516  AST 21 18 16   ALT 20 20 20   ALKPHOS 123 112 113  BILITOT 0.4 0.4 0.4  PROT 7.1 7.3 7.0  ALBUMIN 2.0* 2.0* 1.7*   No results for input(s): LIPASE, AMYLASE in the last 168 hours. No results for input(s): AMMONIA in the last 168 hours. CBC:  Recent Labs Lab 06/15/15 1315 06/16/15 1512 06/17/15 0711 06/18/15 0516  WBC 17.7* 22.7* 14.2* 11.4*  NEUTROABS 14.1*  --   --   --   HGB 9.3* 8.6* 7.8* 7.7*  HCT 28.8* 24.9* 23.3* 23.0*  MCV 87.5 84.7 85.7 85.8  PLT 481* 369 333 339   Cardiac Enzymes: No results for input(s): CKTOTAL, CKMB, CKMBINDEX, TROPONINI in the last 168 hours. BNP (last 3 results) No results for input(s): BNP in the last 8760 hours.  ProBNP (last 3 results) No results for input(s): PROBNP in the last 8760 hours.  CBG:  Recent Labs Lab 06/17/15 0803 06/17/15 1131 06/17/15 1620 06/17/15 2104 06/18/15 0741  GLUCAP 136* 181* 191* 263* 154*  Recent Results (from the past 240 hour(s))  Blood culture (routine x 2)     Status: None (Preliminary result)   Collection Time: 06/16/15  2:58 PM  Result Value Ref Range Status   Specimen Description BLOOD LEFT ANTECUBITAL  Final   Special Requests BOTTLES DRAWN AEROBIC AND ANAEROBIC 6CC EACH  Final   Culture NO GROWTH 1 DAY  Final   Report Status PENDING  Incomplete  Blood culture (routine x 2)     Status: None (Preliminary result)   Collection Time: 06/16/15  3:12 PM  Result Value Ref Range Status   Specimen Description BLOOD RIGHT ANTECUBITAL  Final   Special Requests    Final    BOTTLES DRAWN AEROBIC AND ANAEROBIC AEB=6CC ANA=5CC   Culture  Setup Time   Final    GRAM POSITIVE COCCI IN CHAINS FROM THE ANAEROBIC BOTTLE Gram Stain Report Called to,Read Back By and Verified With: MAYS,J. AT 1742 ON 06/17/2015 BY BAUGHAM,M. Performed at [facility]    Culture   Final    GRAM POSITIVE COCCI Performed at [facility]    Report Status PENDING  Incomplete  Culture, blood (routine x 2)     Status: None (Preliminary result)   Collection Time: 06/17/15  7:11 AM  Result Value Ref Range Status   Specimen Description BLOOD LEFT ARM  Final   Special Requests BOTTLES DRAWN AEROBIC AND ANAEROBIC 10CC  Final   Culture NO GROWTH < 12 HOURS  Final   Report Status PENDING  Incomplete  Culture, blood (routine x 2)     Status: None (Preliminary result)   Collection Time: 06/17/15  7:19 AM  Result Value Ref Range Status   Specimen Description BLOOD RIGHT HAND  Final   Special Requests BOTTLES DRAWN AEROBIC AND ANAEROBIC 10CC  Final   Culture NO GROWTH < 12 HOURS  Final   Report Status PENDING  Incomplete     Studies: US Renal  06/17/2015   CLINICAL DATA:  ATN. Chronic kidney disease stage 4 or 5. Diabetes.  EXAM: RENAL / URINARY TRACT ULTRASOUND COMPLETE  COMPARISON:  None.  FINDINGS: Right Kidney:  Length: 13.7 cm. Echogenicity within normal limits. No mass or hydronephrosis visualized.  Left Kidney:  Length: 14.2 cm. Echogenicity within normal limits. No mass or hydronephrosis visualized.  Bladder:  Appears normal for degree of bladder distention. Ureteral jets not visualized.  IMPRESSION: Normal size kidneys without evidence of hydronephrosis.   Electronically Signed   By: Elberta Fortis M.D.   On: 06/17/2015 16:23   Mr Foot Right Wo Contrast  06/17/2015   CLINICAL DATA:  Right foot pain and swelling.  Abnormal x-ray.  Hip  EXAM: MRI OF THE RIGHT FOREFOOT WITHOUT CONTRAST  TECHNIQUE: Multiplanar, multisequence MR imaging was performed. No intravenous  contrast was administered.  COMPARISON:  X-ray right foot 06/16/2015  FINDINGS: There is significant patient motion degrading image quality limiting evaluation.  There is evidence of fourth and fifth partial metatarsal amputation. There is soft tissue ulceration along the lateral aspect of the left foot overlying the base of the fifth metatarsal. There is soft tissue edema along the lateral aspect of the foot. There is marrow edema within the fifth metatarsal stump with a small adjacent 8 mm fluid collection.  There is no other marrow signal abnormality. 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 There is moderate osteoarthritis of the first MTP joint. There is no acute fracture or dislocation. There is mild T2 signal  within the plantar musculature likely neurogenic. There is no other fluid collection or hematoma.  IMPRESSION: Soft tissue ulceration and edema of the lateral aspect of the right foot most concerning for cellulitis. There is marrow edema within the fifth metatarsal stump with a small 8 mm fluid collection adjacent to the lateral cortex. The appearance is most concerning for osteomyelitis.   Electronically Signed   By: Elige Ko   On: 06/17/2015 08:13   Dg Foot Complete Right  06/16/2015   CLINICAL DATA:  Diabetic presenting with right foot swelling and a large blister on the lateral side of the right foot.  EXAM: RIGHT FOOT COMPLETE - 3+ VIEW  COMPARISON:  06/15/2015 and earlier.  FINDINGS: Prior amputation of the 5th ray at the level of the proximal 5th metatarsal. Since yesterday's examination, development of a small amount of gas in the lateral soft tissues overlying the amputated 5th metatarsal. No evidence of underlying osteomyelitis involving the remaining 5th metatarsal. Prior amputation of the head of the 4th metatarsal. No evidence of acute fracture. Severe joint space narrowing and associated hypertrophic spurring involving the IP joint of the great toe. Remaining joint  spaces well preserved. No erosions. Mild osseous demineralization.  IMPRESSION: 1. Soft tissue infection involving the lateral foot with gas in the lateral soft tissues which was not visible yesterday. 2. No evidence of osteomyelitis involving the underlying remaining 5th metatarsal or elsewhere. 3. 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   Electronically Signed   By: Hulan Saas M.D.   On: 06/16/2015 16:10    Scheduled Meds: . sodium chloride   Intravenous Once  . ALPRAZolam  0.5 mg Oral TID  . ciprofloxacin  400 mg Intravenous Q24H  . clindamycin (CLEOCIN) IV  600 mg Intravenous 3 times per day  . cyclobenzaprine  10 mg Oral BID  . ferrous sulfate  325 mg Oral Q breakfast  . furosemide  20 mg Intravenous Once  . furosemide  20 mg Oral Daily  . heparin  5,000 Units Subcutaneous 3 times per day  . insulin aspart  0-15 Units Subcutaneous TID WC  . insulin aspart  0-5 Units Subcutaneous QHS  . insulin glargine  10 Units Subcutaneous BID  . metoprolol  50 mg Oral BID  . pantoprazole  80 mg Oral Daily  . sodium bicarbonate  650 mg Oral TID  . sodium chloride  3 mL Intravenous Q12H  . Vilazodone HCl  20 mg Oral QHS   Continuous Infusions: . sodium chloride 50 mL/hr at 06/17/15 2311    Assessment and plan:  Principal Problem:   Sepsis Active Problems:   Diabetic foot infection   Osteomyelitis of right foot   Gram-positive cocci bacteremia   Acute renal failure superimposed on stage 4 chronic kidney disease   Diabetes mellitus with complication   Lower extremity edema   Hyponatremia   Anemia, chronic disease   Essential hypertension   Depression with anxiety   GERD (gastroesophageal reflux disease)    1. Sepsis secondary to right diabetic foot infection/abscess/osteomyelitis. On admission, the patient was febrile with a temperature 101, tachycardic with a heart rate of 110, and with an obvious infected foot wound. X-ray of his right foot revealed  soft tissue infection involving the lateral foot with gas visible; no osteomyelitis seen. However, follow-up MRI revealed evidence of soft tissue ulceration/edema of the lateral right foot consistent with cellulitis and marrow edema within the fifth metatarsal stop within 8 mm fluid collection  an appearance concerning for osteomyelitis. His edema is likely secondary to the infection; it is now subsiding. -The patient was started on clindamycin, given his multiple anabolic allergies. Cipro was added the next day for additional coverage. -I discussed the patient's case with ID physician on call, Dr. Enedina Finner on 7/20.Marland Kitchen Per our conversation, clindamycin and Cipro are good choices in the setting of multiple antibiotic allergies. However, if patient continued to spike fevers through Cipro and clindamycin over the next 24-48 hours, he suggested stopping clindamycin and starting Zyvox. If debridement occurs, he recommended continuing Cipro and Zyvox, both of which can be given orally, for 3-4 weeks. If the patient ends up needing an amputation, he would recommend Cipro and Zyvox for 1- 2 weeks following. -Dr. [name] was consulted. He is managing dressing changes. He plans debridement and application of VAC on 06/19/15. I will transfuse 2 units of packed red blood cells today in anticipation of surgical debridement tomorrow. -Patient's fever curve is trending downward. His white blood cell count is improving progressively.  Gram-positive cocci bacteremia. One out of 4 cultures so far is growing gram-positive bacteremia. We'll continue current antibiotics and await the identification.  Normocytic anemia, secondary to chronic disease and iron deficiency. Patient's hemoglobin was 9.3 on admission. Following IV fluids, it decreased to 8.6 and now has fallen to below 8 g. The decrease is in part due to hemodilution from IV fluids, acute infection, and chronic kidney disease. His anemia panel revealed a total iron  of 6, TIBC 143, ferritin 236, folate 25, and vitamin B12 520. -Ferrous sulfate 325 mg daily was started. -In light of anticipated extensive debridement of the left foot wound, will go ahead and transfuse him 2 units of packed red blood cells today.  Acute renal failure superimposed on stage IV chronic kidney disease. Patient reported that his baseline creatinine ranges from the upper 3s to the lower 4s. He is followed by nephrologist, Dr. Felipa Furnace in [place]. Per his history, he is being considered for a renal transplant at [facility]. He and his nephrologist are trying to hold off on dialysis pending being listed for the transplantation. Patient states that he still makes urine. -Patient was started on vigorous IV fluids, with no minimal improvement in his creatinine. IV fluids were decreased. -Urine output is not recorded, but the patient reports urinating. -His renal ultrasound was unremarkable. -Continue to hold lisinopril, but continue oral Lasix unless nephrology recommends holding it. -Nephrology consulted and agrees with current management. Sodium bicarbonate added for mild metabolic acidosis. -Will try to obtain records and recent labs from Dr. Felipa Furnace.  Hyponatremia. Patient's serum sodium was 133 on admission. Following vigorous IV fluids, it has decreased. IV fluid rate decreased. Serum sodium has slightly improved.  Insulin-dependent diabetes mellitus with nephropathy, neuropathy, and PVD. The patient is treated chronically with sliding scale NovoLog and Lantus. His hemoglobin A1c was 10.1 indicating suboptimal outpatient control. Sliding scale NovoLog and Lantus dosing has been adjusted to avoid hypoglycemia. We'll continue to monitor for further adjustments. His CBGs have trended downward.  Essential hypertension. Patient is treated with lisinopril, Lasix and metoprolol chronically. Lisinopril is being held, given his worsening renal function. We'll continue metoprolol and Lasix as  ordered. His blood pressure is reasonable.  Depression with anxiety. Currently stable. Will continue chronic medications.    Time spent: 35 minutes   [facility]  [facility] Pager [phone]. If 7PM-7AM, please contact night-coverage at [email or web address], password [facility] 06/18/2015, 11:22 AM  LOS: 1 day

## 2015-06-19 ENCOUNTER — Inpatient Hospital Stay (HOSPITAL_COMMUNITY): Payer: Medicare Other | Admitting: Anesthesiology

## 2015-06-19 ENCOUNTER — Encounter (HOSPITAL_COMMUNITY): Payer: Self-pay

## 2015-06-19 ENCOUNTER — Encounter (HOSPITAL_COMMUNITY)
Admission: EM | Disposition: A | Discharge status: Home / Self Care | Payer: Self-pay | Source: Home / Self Care | Attending: Internal Medicine

## 2015-06-19 ENCOUNTER — Inpatient Hospital Stay (HOSPITAL_COMMUNITY): Payer: Medicare Other

## 2015-06-19 DIAGNOSIS — D638 Anemia in other chronic diseases classified elsewhere: Secondary | ICD-10-CM

## 2015-06-19 HISTORY — PX: APPLICATION OF WOUND VAC: SHX5189

## 2015-06-19 HISTORY — PX: IRRIGATION AND DEBRIDEMENT FOOT: SHX6602

## 2015-06-19 LAB — TYPE AND SCREEN
ABO/RH(D): O NEG
Antibody Screen: NEGATIVE
UNIT DIVISION: 0
Unit division: 0

## 2015-06-19 LAB — CBC
HCT: 28.3 % — ABNORMAL LOW (ref 39.0–52.0)
HEMOGLOBIN: 9.5 g/dL — AB (ref 13.0–17.0)
MCH: 28.4 pg (ref 26.0–34.0)
MCHC: 33.6 g/dL (ref 30.0–36.0)
MCV: 84.7 fL (ref 78.0–100.0)
Platelets: 343 10*3/uL (ref 150–400)
RBC: 3.34 MIL/uL — AB (ref 4.22–5.81)
RDW: 13.6 % (ref 11.5–15.5)
WBC: 9.2 10*3/uL (ref 4.0–10.5)

## 2015-06-19 LAB — IRON AND TIBC
IRON: 64 ug/dL (ref 45–182)
SATURATION RATIOS: 52 % — AB (ref 17.9–39.5)
TIBC: 123 ug/dL — ABNORMAL LOW (ref 250–450)
UIBC: 59 ug/dL

## 2015-06-19 LAB — BASIC METABOLIC PANEL
ANION GAP: 6 (ref 5–15)
BUN: 47 mg/dL — ABNORMAL HIGH (ref 6–20)
CHLORIDE: 105 mmol/L (ref 101–111)
CO2: 23 mmol/L (ref 22–32)
Calcium: 7.9 mg/dL — ABNORMAL LOW (ref 8.9–10.3)
Creatinine, Ser: 4.17 mg/dL — ABNORMAL HIGH (ref 0.61–1.24)
GFR calc non Af Amer: 16 mL/min — ABNORMAL LOW (ref >60)
GFR, EST AFRICAN AMERICAN: 19 mL/min — AB (ref >60)
Glucose, Bld: 202 mg/dL — ABNORMAL HIGH (ref 65–99)
POTASSIUM: 3.8 mmol/L (ref 3.5–5.1)
SODIUM: 134 mmol/L — AB (ref 135–145)

## 2015-06-19 LAB — GLUCOSE, CAPILLARY
GLUCOSE-CAPILLARY: 224 mg/dL — AB (ref 65–99)
Glucose-Capillary: 183 mg/dL — ABNORMAL HIGH (ref 65–99)
Glucose-Capillary: 163 mg/dL — ABNORMAL HIGH (ref 65–99)
Glucose-Capillary: 208 mg/dL — ABNORMAL HIGH (ref 65–99)
Glucose-Capillary: 240 mg/dL — ABNORMAL HIGH (ref 65–99)

## 2015-06-19 LAB — CULTURE, BLOOD (ROUTINE X 2)

## 2015-06-19 LAB — SURGICAL PCR SCREEN
MRSA, PCR: NEGATIVE
Staphylococcus aureus: NEGATIVE

## 2015-06-19 LAB — FERRITIN: FERRITIN: 251 ng/mL (ref 24–336)

## 2015-06-19 SURGERY — APPLICATION, WOUND VAC
Anesthesia: Monitor Anesthesia Care | Site: Foot | Laterality: Right

## 2015-06-19 MED ORDER — BUPIVACAINE HCL (PF) 0.5 % IJ SOLN
INTRAMUSCULAR | Status: DC | PRN
  Administered 2015-06-19: 20 mL

## 2015-06-19 MED ORDER — CIPROFLOXACIN IN D5W 400 MG/200ML IV SOLN
400.0000 mg | INTRAVENOUS | Status: DC
  Administered 2015-06-19: 400 mg via INTRAVENOUS
  Filled 2015-06-19: qty 200

## 2015-06-19 MED ORDER — LIDOCAINE HCL (CARDIAC) 10 MG/ML IV SOLN
INTRAVENOUS | Status: DC | PRN
  Administered 2015-06-19: 50 mg via INTRAVENOUS

## 2015-06-19 MED ORDER — PROPOFOL 10 MG/ML IV BOLUS
INTRAVENOUS | Status: AC
  Filled 2015-06-19: qty 20

## 2015-06-19 MED ORDER — FENTANYL CITRATE (PF) 100 MCG/2ML IJ SOLN
INTRAMUSCULAR | Status: AC
  Filled 2015-06-19: qty 2

## 2015-06-19 MED ORDER — MIDAZOLAM HCL 2 MG/2ML IJ SOLN
INTRAMUSCULAR | Status: AC
  Filled 2015-06-19: qty 2

## 2015-06-19 MED ORDER — LIDOCAINE HCL (PF) 1 % IJ SOLN
INTRAMUSCULAR | Status: AC
  Filled 2015-06-19: qty 10

## 2015-06-19 MED ORDER — FENTANYL CITRATE (PF) 100 MCG/2ML IJ SOLN
25.0000 ug | INTRAMUSCULAR | Status: DC | PRN
  Administered 2015-06-19 (×4): 50 ug via INTRAVENOUS
  Filled 2015-06-19 (×2): qty 2

## 2015-06-19 MED ORDER — LACTATED RINGERS IV SOLN
INTRAVENOUS | Status: DC
  Administered 2015-06-19: 09:00:00 via INTRAVENOUS

## 2015-06-19 MED ORDER — ONDANSETRON HCL 4 MG/2ML IJ SOLN
INTRAMUSCULAR | Status: AC
  Filled 2015-06-19: qty 2

## 2015-06-19 MED ORDER — MIDAZOLAM HCL 2 MG/2ML IJ SOLN
1.0000 mg | INTRAMUSCULAR | Status: DC | PRN
  Administered 2015-06-19 (×2): 2 mg via INTRAVENOUS

## 2015-06-19 MED ORDER — ONDANSETRON HCL 4 MG/2ML IJ SOLN
4.0000 mg | Freq: Once | INTRAMUSCULAR | Status: AC
  Administered 2015-06-19: 4 mg via INTRAVENOUS

## 2015-06-19 MED ORDER — PROPOFOL INFUSION 10 MG/ML OPTIME
INTRAVENOUS | Status: DC | PRN
  Administered 2015-06-19: 55 ug/kg/min via INTRAVENOUS
  Administered 2015-06-19: 125 ug/kg/min via INTRAVENOUS

## 2015-06-19 MED ORDER — FENTANYL CITRATE (PF) 100 MCG/2ML IJ SOLN
INTRAMUSCULAR | Status: DC | PRN
  Administered 2015-06-19 (×4): 25 ug via INTRAVENOUS

## 2015-06-19 MED ORDER — SODIUM CHLORIDE 0.9 % IR SOLN
Status: DC | PRN
  Administered 2015-06-19: 1000 mL

## 2015-06-19 MED ORDER — ONDANSETRON HCL 4 MG/2ML IJ SOLN: 4.0000 mg | Freq: Once | INTRAMUSCULAR | Status: DC | PRN

## 2015-06-19 MED ORDER — BUPIVACAINE HCL (PF) 0.5 % IJ SOLN
INTRAMUSCULAR | Status: AC
  Filled 2015-06-19: qty 30

## 2015-06-19 MED ORDER — FENTANYL CITRATE (PF) 100 MCG/2ML IJ SOLN
25.0000 ug | INTRAMUSCULAR | Status: AC
  Administered 2015-06-19 (×2): 25 ug via INTRAVENOUS

## 2015-06-19 SURGICAL SUPPLY — 57 items
BAG HAMPER (MISCELLANEOUS) ×4 IMPLANT
BANDAGE ELASTIC 4 VELCRO NS (GAUZE/BANDAGES/DRESSINGS) ×8 IMPLANT
BANDAGE ESMARK 4X12 BL STRL LF (DISPOSABLE) ×2 IMPLANT
BENZOIN TINCTURE PRP APPL 2/3 (GAUZE/BANDAGES/DRESSINGS) ×4 IMPLANT
BLADE 15 SAFETY STRL DISP (BLADE) ×8 IMPLANT
BLADE OSC/SAG 18.5X9 THN (BLADE) ×4 IMPLANT
BLADE SURG 15 STRL LF DISP TIS (BLADE) ×2 IMPLANT
BLADE SURG 15 STRL SS (BLADE) ×2
BNDG CONFORM 2 STRL LF (GAUZE/BANDAGES/DRESSINGS) IMPLANT
BNDG ESMARK 4X12 BLUE STRL LF (DISPOSABLE) ×4
BNDG GAUZE ELAST 4 BULKY (GAUZE/BANDAGES/DRESSINGS) ×12 IMPLANT
CANISTER WOUND CARE 500ML ATS (WOUND CARE) ×8 IMPLANT
CHLORAPREP W/TINT 26ML (MISCELLANEOUS) ×4 IMPLANT
CLOSURE WOUND 1/2 X4 (GAUZE/BANDAGES/DRESSINGS) ×1
CLOTH BEACON ORANGE TIMEOUT ST (SAFETY) ×4 IMPLANT
COVER LIGHT HANDLE STERIS (MISCELLANEOUS) ×8 IMPLANT
CUFF TOURN SGL LL 12 (TOURNIQUET CUFF) IMPLANT
CUFF TOURNIQUET SINGLE 18IN (TOURNIQUET CUFF) ×4 IMPLANT
CUFF TOURNIQUET SINGLE 24IN (TOURNIQUET CUFF) IMPLANT
DECANTER SPIKE VIAL GLASS SM (MISCELLANEOUS) ×4 IMPLANT
DRAPE OEC MINIVIEW 54X84 (DRAPES) IMPLANT
DRSG ADAPTIC 3X8 NADH LF (GAUZE/BANDAGES/DRESSINGS) ×4 IMPLANT
DRSG VAC ATS LRG SENSATRAC (GAUZE/BANDAGES/DRESSINGS) ×4 IMPLANT
DRSG VAC ATS MED SENSATRAC (GAUZE/BANDAGES/DRESSINGS) ×4 IMPLANT
DURA STEPPER LG (CAST SUPPLIES) IMPLANT
DURA STEPPER MED (CAST SUPPLIES) IMPLANT
DURA STEPPER SML (CAST SUPPLIES) IMPLANT
DURA STEPPER XL (SOFTGOODS) IMPLANT
ELECT NEEDLE TIP 2.8 STRL (NEEDLE) ×4 IMPLANT
ELECT REM PT RETURN 9FT ADLT (ELECTROSURGICAL) ×4
ELECTRODE REM PT RTRN 9FT ADLT (ELECTROSURGICAL) ×2 IMPLANT
FORMALIN 10 PREFIL 480ML (MISCELLANEOUS) ×4 IMPLANT
FORMALIN 10 PREFILL 60ML (MISCELLANEOUS) ×8 IMPLANT
GAUZE SPONGE 4X4 12PLY STRL (GAUZE/BANDAGES/DRESSINGS) ×12 IMPLANT
GLOVE BIO SURGEON STRL SZ7.5 (GLOVE) ×4 IMPLANT
GLOVE BIOGEL PI IND STRL 7.0 (GLOVE) ×2 IMPLANT
GLOVE BIOGEL PI INDICATOR 7.0 (GLOVE) ×2
GLOVE EXAM NITRILE MD LF STRL (GLOVE) ×4 IMPLANT
GOWN STRL REUS W/TWL LRG LVL3 (GOWN DISPOSABLE) ×12 IMPLANT
KIT ROOM TURNOVER AP CYSTO (KITS) ×4 IMPLANT
KIT ROOM TURNOVER APOR (KITS) ×4 IMPLANT
MANIFOLD NEPTUNE II (INSTRUMENTS) ×4 IMPLANT
MARKER SKIN DUAL TIP RULER LAB (MISCELLANEOUS) IMPLANT
NEEDLE HYPO 27GX1-1/4 (NEEDLE) ×4 IMPLANT
NS IRRIG 1000ML POUR BTL (IV SOLUTION) ×12 IMPLANT
PACK BASIC LIMB (CUSTOM PROCEDURE TRAY) ×4 IMPLANT
PAD ABD 5X9 TENDERSORB (GAUZE/BANDAGES/DRESSINGS) ×8 IMPLANT
PAD ARMBOARD 7.5X6 YLW CONV (MISCELLANEOUS) ×4 IMPLANT
SET BASIN LINEN APH (SET/KITS/TRAYS/PACK) ×4 IMPLANT
SPONGE LAP 18X18 X RAY DECT (DISPOSABLE) ×4 IMPLANT
STRIP CLOSURE SKIN 1/2X4 (GAUZE/BANDAGES/DRESSINGS) ×3 IMPLANT
SUT MON AB 2-0 CT1 36 (SUTURE) IMPLANT
SUT VIC AB 4-0 PS2 27 (SUTURE) IMPLANT
SUT VICRYL AB 3-0 FS1 BRD 27IN (SUTURE) IMPLANT
SWAB COLLECTION DEVICE MRSA (MISCELLANEOUS) ×4 IMPLANT
SWAB CULTURE LIQ STUART DBL (MISCELLANEOUS) ×4 IMPLANT
SYRINGE 10CC LL (SYRINGE) ×8 IMPLANT

## 2015-06-19 NOTE — Care Management Note (Signed)
Case Management Note  Patient Details  Name: Maurice [name] MRN: [id number] Date of Birth: Jan 26, 1976  Expected Discharge Date:                  Expected Discharge Plan:  Home w Home Health Services  In-House Referral:  NA  Discharge planning Services  CM Consult  Post Acute Care Choice:  Home Health, Durable Medical Equipment Choice offered to:  Patient  DME Arranged:  Negative pressure wound device DME Agency:  [employer]  HH Arranged:  RN HH Agency:  [facility]  Status of Service:  In process, will continue to follow  Medicare Important Message Given:    Date Medicare IM Given:    Medicare IM give by:    Date Additional Medicare IM Given:    Additional Medicare Important Message give by:     If discussed at Long Length of Stay Meetings, dates discussed:    Additional Comments: Pt's wound vac applied in surgery today. This is the patient's wound vac and should be sent home with the patient. The patient wants [facility] and if patient is discharged over weekend the discharging RN can fax [facility] orders to [facility] and call to notify them of discharge. Benefits check performed for [employer] and the abx is covered by pt's insurance with no co-pay for the patient.   Malcolm Metro, RN 06/19/2015, 12:44 PM

## 2015-06-19 NOTE — Transfer of Care (Signed)
Immediate Anesthesia Transfer of Care Note  Patient: Maurice [name]  Procedure(s) Performed: Procedure(s): APPLICATION OF WOUND VAC (Right) IRRIGATION AND DEBRIDEMENT FOOT (Right)  Patient Location: PACU  Anesthesia Type:MAC  Level of Consciousness: awake and alert   Airway & Oxygen Therapy: Patient Spontanous Breathing  Post-op Assessment: Report given to RN, Post -op Vital signs reviewed and stable and Patient moving all extremities  Post vital signs: Reviewed and stable   Complications: No apparent anesthesia complications

## 2015-06-19 NOTE — Progress Notes (Signed)
Patient ready for transfer back to room once dr [name] finishes the dressing and reataching the wound vac

## 2015-06-19 NOTE — Op Note (Addendum)
OPERATIVE NOTE  DATE OF PROCEDURE:  06/19/2015  SURGEON:  Dallas Schimke, DPM  OR STAFF:   Circulator: Galen Manila, RN Scrub Person: Horatio Pel, CST Circulator Assistant: Wynelle Beckmann, RN.   PREOPERATIVE DIAGNOSIS:   1.  Osteomyelitis, right foot. 2.  Nonhealing ulceration, right foot. 3.  Diabetes mellitus with peripheral neuropathy.  POSTOPERATIVE DIAGNOSIS:  Same.  PROCEDURE:   1.  Excisional debridement of skin, subcutaneous tissue, muscle and bone, right foot. 2.  Application of Wound VAC, right foot.  ANESTHESIA:  Monitor Anesthesia Care.   HEMOSTASIS:  Pneumatic ankle tourniquet set at 250 mmHg.  ESTIMATED BLOOD LOSS:  Minimal (<10 cc).  MATERIALS USED:  [employer] Wound VAC.  INJECTABLES:  0.5% Marcaine plain.  PATHOLOGY:   1.  Aerobic culture. 2.  Anaerobic culture. 3.  Soft tissue, right foot. 4.  5th metatarsal, right foot. 5.  5th metatarsal, right foot for margins.  COMPLICATIONS:  I was unable to successfully maintain pressure of the Wound VAC without a "blockage" alert.  INDICATIONS:  Nonhealing diabetic ulceration, right foot with MRI findings consistent with osteomyelitis of the distal 5th metatarsal stump.  DESCRIPTION OF THE PROCEDURE:  The patient was brought to the operating room and placed on the operative table in the supine position.  A pneumatic ankle tourniquet was applied to the patient's ankle.  Following sedation, the surgical site was anesthetized with 0.5% Marcaine plain.  The foot was then prepped, scrubbed, and draped in the usual sterile technique.  The foot was elevated, exsanguinated and the pneumatic ankle tourniquet inflated to 250 mmHg.    Attention was directed to the plantar lateral aspect of the right foot where a full-thickness ulceration was encountered.  A second ulceration was noted plantarly and a third ulceration dorsally.  All three ulcerations were comprised of fibrotic, nonviable tissue.  The plantar lateral  ulceration had areas of necrosis.  Using a #15 blade, an excisional debridement of the skin, subcutaneous tissue, muscle and plantar fascia were performed down to bleeding viable tissue.  The excised soft tissue was sent to pathology for evaluation.  A deep aerobic was peformed.  A anaerobic culture was performed.  The fifth metatarsal stump was identified.  The distal aspect of the fifth metatarsal appeared soft and nonviable.  He bone was resected using a power saw and sent to pathology for evaluation.  A segment of bone was then resected using a power saw and sent to pathology for margins.  The surgical wound measured 9.7 cm in length by 10.3 cm in width by 3.3 cm in depth.  A [employer] wound VAC was applied to the right foot with continuous pressure at 125 mmHg.  The pneumatic ankle tourniquet was deflated and a prompt hyperemic response was noted to all remaining digits of the right foot.  The patient tolerated the procedure and anesthesia well.  He was transferred from the operating room to the recovery room with vital signs.     Immediately following transfer to the recovery room, I received a "blockage" alert reading on the Wound VAC.  I was unable to obtain appropriate pressure.  A moist saline dressing was applied to the surgical wound.  I will attempt to apply the Wound VAC this evening.  Following a period of postoperative monitoring, the patient will be transferred to the floor.

## 2015-06-19 NOTE — Anesthesia Procedure Notes (Signed)
Procedure Name: MAC Date/Time: 06/19/2015 9:08 AM Performed by: Franco Nones Pre-anesthesia Checklist: Patient identified, Emergency Drugs available, Suction available, Timeout performed and Patient being monitored Patient Re-evaluated:Patient Re-evaluated prior to inductionOxygen Delivery Method: Non-rebreather mask

## 2015-06-19 NOTE — OR Nursing (Signed)
Dr. [name] removed wound vac in pacu and replaced it with a new one, new canister and machine; still unable to maintain pressure; Dr. [name] removed wound vac again, redressed with a wet to dry dressing, saline soaked 4x4, ABDs x 2, kerlix and ace wrap. Dr. [name] took wound vac machine and extra supplies back to patient's room for dressing change later today.

## 2015-06-19 NOTE — OR Nursing (Signed)
Per Dr. [name], wound right foot measured 9.7 cm length, 10.3 cm width, 3.3 cm depth intraop

## 2015-06-19 NOTE — Progress Notes (Signed)
Podiatry Progress Note  Subjective: Maurice [name] is a 39 y.o. male S/P extensive debridement of the right foot early today.  The Wound VAC applied in the OR was removed immediately postoperatively secondary to [facility] detecting a "blockage".  A wet to dry dressing was applied.  He has experienced some pain consistent with the procedure.  He has received pain meds which have helped.    Past Medical History  Diagnosis Date  . PVC (premature ventricular contraction)   . PAC (premature atrial contraction)   . Diabetes mellitus without complication   . Anxiety   . Depression   . Blood clot in vein     pt sts" it may have been in artery and not vein" of right leg  . GERD (gastroesophageal reflux disease)   . H/O hiatal hernia   . Barrett's esophagus   . Arthritis   . Neuropathy   . Herniated disc, cervical     c5 and c6 and c6 and c7  . Hypertension   . Kidney failure     stage 4 just dx 4 months ago.Sees Dr Elby Beck, nephrologist in [place] [phone].  .  . 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 Diabetic retinopathy of both eyes, associated with type 2 diabetes mellitus   . Osteomyelitis of right foot 06/17/2015  . Diabetic foot infection 06/16/2015   Scheduled Meds: . ALPRAZolam  0.5 mg Oral TID  . ciprofloxacin  400 mg Intravenous Q24H  . clindamycin (CLEOCIN) IV  600 mg Intravenous 3 times per day  . cyclobenzaprine  10 mg Oral BID  . ferrous sulfate  325 mg Oral Q breakfast  . furosemide  20 mg Oral Daily  . insulin aspart  0-15 Units Subcutaneous TID WC  . insulin aspart  0-5 Units Subcutaneous QHS  . insulin aspart  4 Units Subcutaneous TID WC  . insulin glargine  15 Units Subcutaneous BID  . metoprolol  50 mg Oral BID  . pantoprazole  80 mg Oral Daily  . sodium bicarbonate  650 mg Oral TID  . sodium chloride  3 mL Intravenous Q12H  . Vilazodone HCl  20 mg Oral QHS   Continuous Infusions: . sodium chloride 50 mL/hr at 06/19/15 1708    PRN Meds:.acetaminophen **OR** acetaminophen, hydrALAZINE, morphine injection, ondansetron (ZOFRAN) IV, oxyCODONE  Allergies  Allergen Reactions  . Daptomycin     Hypotension   . Bactrim [Sulfamethoxazole-Trimethoprim] Other (See Comments)    Cannot take due to kidney issue  . Dapsone Nausea And Vomiting  . Latex Other (See Comments)    Blisters.   . Penicillins Hives  . Vancomycin Itching   Past Surgical History  Procedure Laterality Date  . Wrist arthroscopy      left with no break reapir of tendond and ligamants  . Toe amputation      5th digit right foot-[place]  . Debridement leg      lower leg-left  . Metatarsal head excision Right 02/14/2013    Procedure: 4TH METATARSAL HEAD RESECTION RIGHT FOOT;  Surgeon: Dallas Schimke, DPM;  Location: [facility];  Service: Orthopedics;  Laterality: Right;  latex allergy  . Vitrectomy Right    Family History  Problem Relation Age of Onset  . Cancer Mother     liver lung  . Cancer Father     liver  . Diabetes Mother   . Diabetes Father    Social History:  reports  that he has been smoking Cigarettes.  He has a 5 pack-year smoking history. His smokeless tobacco use includes Chew. He reports that he drinks alcohol. He reports that he does not use illicit drugs.  Physical Examination: Vital signs in last 24 hours:   Temp:  [97.6 F (36.4 C)-99.1 F (37.3 C)] 97.6 F (36.4 C) (07/22 1241) Pulse Rate:  [69-83] 75 (07/22 1241) Resp:  [10-20] 17 (07/22 1241) BP: (141-180)/(78-102) 141/90 mmHg (07/22 1241) SpO2:  [94 %-100 %] 100 % (07/22 1241) Weight:  [235 lb (106.595 kg)-235 lb 11.2 oz (106.913 kg)] 235 lb (106.595 kg) (07/22 0757)  DRESSING:  Intact with strikethrough plantarly. INTEGUMENT:  The surgical wound appears viable. VASCULAR:   Pedal pulses are palpable.  There is edema of the right lower extremity.  Lab/Test Results:    Recent Labs  06/18/15 0516 06/18/15 2214 06/19/15 0612  WBC 11.4* 10.7* 9.2  HGB  7.7* 10.1* 9.5*  HCT 23.0* 29.2* 28.3*  PLT 339 323 343  NA 131*  --  134*  K 3.8  --  3.8  CL 102  --  105  CO2 21*  --  23  BUN 47*  --  47*  CREATININE 4.38*  --  4.17*  GLUCOSE 148*  --  202*  CALCIUM 7.9*  --  7.9*    Recent Results (from the past 240 hour(s))  Blood culture (routine x 2)     Status: None (Preliminary result)   Collection Time: 06/16/15  2:58 PM  Result Value Ref Range Status   Specimen Description BLOOD LEFT ANTECUBITAL  Final   Special Requests BOTTLES DRAWN AEROBIC AND ANAEROBIC 6CC EACH  Final   Culture NO GROWTH 3 DAYS  Final   Report Status PENDING  Incomplete  Blood culture (routine x 2)     Status: None   Collection Time: 06/16/15  3:12 PM  Result Value Ref Range Status   Specimen Description BLOOD RIGHT ANTECUBITAL  Final   Special Requests   Final    BOTTLES DRAWN AEROBIC AND ANAEROBIC AEB=6CC ANA=5CC   Culture  Setup Time   Final    GRAM POSITIVE COCCI IN CHAINS FROM THE ANAEROBIC BOTTLE Gram Stain Report Called to,Read Back By and Verified With: MAYS,J. AT 1742 ON 06/17/2015 BY BAUGHAM,M. Performed at [facility]    Culture   Final    STREPTOCOCCUS AGALACTIAE Performed at [facility]    Report Status 06/19/2015 FINAL  Final   Organism ID, Bacteria STREPTOCOCCUS AGALACTIAE  Final      Susceptibility   Streptococcus agalactiae - MIC*    CLINDAMYCIN >=1 RESISTANT Resistant     AMPICILLIN <=0.25 SENSITIVE Sensitive     ERYTHROMYCIN >=8 RESISTANT Resistant     VANCOMYCIN 0.5 SENSITIVE Sensitive     CEFTRIAXONE <=0.12 SENSITIVE Sensitive     LEVOFLOXACIN 1 SENSITIVE Sensitive     * STREPTOCOCCUS AGALACTIAE  Culture, blood (routine x 2)     Status: None (Preliminary result)   Collection Time: 06/17/15  7:11 AM  Result Value Ref Range Status   Specimen Description BLOOD LEFT ARM  Final   Special Requests BOTTLES DRAWN AEROBIC AND ANAEROBIC 10CC  Final   Culture NO GROWTH 2 DAYS  Final   Report Status PENDING  Incomplete   Culture, blood (routine x 2)     Status: None (Preliminary result)   Collection Time: 06/17/15  7:19 AM  Result Value Ref Range Status   Specimen Description BLOOD RIGHT  HAND  Final   Special Requests BOTTLES DRAWN AEROBIC AND ANAEROBIC 10CC  Final   Culture NO GROWTH 2 DAYS  Final   Report Status PENDING  Incomplete  Surgical pcr screen     Status: None   Collection Time: 06/18/15 10:10 PM  Result Value Ref Range Status   MRSA, PCR NEGATIVE NEGATIVE Final   Staphylococcus aureus NEGATIVE NEGATIVE Final    Comment:        The Xpert SA Assay (FDA approved for NASAL specimens in patients over 11 years of age), is one component of a comprehensive surveillance program.  Test performance has been validated by [facility] for patients greater than or equal to 67 year old. It is not intended to diagnose infection nor to guide or monitor treatment.      Dg Foot Complete Right  06/19/2015   CLINICAL DATA:  Postoperative debridement.  EXAM: RIGHT FOOT COMPLETE - 3+ VIEW  COMPARISON:  June 16, 2015.  FINDINGS: Status post old surgical resection of distal fourth metatarsal is noted. There is been interval surgical resection of the residual portion of the fifth metatarsal. Large soft tissue defect is seen laterally in the right midfoot consistent with surgery. Wound VAC is seen over this site. Degenerative changes seen involving the first metatarsophalangeal and interphalangeal joints.  IMPRESSION: Status post surgical resection of residual portion of fifth metatarsal with wound VAC over this site. No other areas of lytic destruction are seen to suggest osteomyelitis.   Electronically Signed   By: Lupita Raider, M.D.   On: 06/19/2015 11:27    Assessment: S/P debridement of the right foot.  Plan: 1.  Continue IV antibiotics per primary service. 2.  Wound VAC was applied to the right foot at 125 mmHg of continuous pressure.  I will change again tomorrow.  Based on the appearance of the  wound, I will provide my recommendations regarding discharge. 3.  NWB of right foot. 4.  Hold anticoagulation secondary to increased risk of bleeding from the surgical site. 5.  CBC in AM.   Devanshi Califf IVAN 06/19/2015, 5:57 PM

## 2015-06-19 NOTE — Anesthesia Preprocedure Evaluation (Signed)
Anesthesia Evaluation  Patient identified by MRN, date of birth, ID band Patient awake    Reviewed: Allergy & Precautions, H&P , NPO status , Patient's Chart, lab work & pertinent test results  Airway Mallampati: I  TM Distance: >3 FB     Dental  (+) Teeth Intact   Pulmonary neg pulmonary ROS, sleep apnea , Current Smoker,  breath sounds clear to auscultation        Cardiovascular hypertension, Pt. on medications + dysrhythmias Rhythm:Regular Rate:Normal     Neuro/Psych PSYCHIATRIC DISORDERS Anxiety Depression    GI/Hepatic hiatal hernia, GERD-  Medicated,  Endo/Other  diabetes, Poorly Controlled, Type 2, Insulin Dependent  Renal/GU Renal InsufficiencyRenal disease     Musculoskeletal   Abdominal   Peds  Hematology   Anesthesia Other Findings   Reproductive/Obstetrics                             Anesthesia Physical Anesthesia Plan  ASA: III  Anesthesia Plan: MAC   Post-op Pain Management:    Induction: Intravenous  Airway Management Planned: Nasal Cannula  Additional Equipment:   Intra-op Plan:   Post-operative Plan:   Informed Consent: I have reviewed the patients History and Physical, chart, labs and discussed the procedure including the risks, benefits and alternatives for the proposed anesthesia with the patient or authorized representative who has indicated his/her understanding and acceptance.     Plan Discussed with:   Anesthesia Plan Comments:         Anesthesia Quick Evaluation

## 2015-06-19 NOTE — Progress Notes (Signed)
ANTIBIOTIC CONSULT NOTE  Pharmacy Consult for Cipro Indication: Sepsis secondary to right diabetic foot infection/abscess/osteomyelitis.   Allergies  Allergen Reactions  . Daptomycin     Hypotension   . Bactrim [Sulfamethoxazole-Trimethoprim] Other (See Comments)    Cannot take due to kidney issue  . Dapsone Nausea And Vomiting  . Latex Other (See Comments)    Blisters.   . Penicillins Hives  . Vancomycin Itching   Patient Measurements: Height:  (190.5 cm) Weight: 235 lb (106.595 kg) IBW/kg (Calculated) : 84.5  Vital Signs: Temp: 98.2 F (36.8 C) (07/22 1038) Temp Source: Oral (07/22 0800) BP: 160/92 mmHg (07/22 1145) Pulse Rate: 70 (07/22 1200) Intake/Output from previous day: 07/21 0701 - 07/22 0700 In: 3492.5 [P.O.:720; I.V.:1707.5; Blood:715; IV Piggyback:350] Out: 2600 [Urine:2600] Intake/Output from this shift: Total I/O In: 700 [I.V.:700] Out: 15 [Blood:15]  Labs:  Recent Labs  06/17/15 0711 06/18/15 0516 06/18/15 2214 06/19/15 0612  WBC 14.2* 11.4* 10.7* 9.2  HGB 7.8* 7.7* 10.1* 9.5*  PLT 333 339 323 343  CREATININE 4.20* 4.38*  --  4.17*   Estimated Creatinine Clearance: 31.1 mL/min (by C-G formula based on Cr of 4.17). No results for input(s): VANCOTROUGH, VANCOPEAK, VANCORANDOM, GENTTROUGH, GENTPEAK, GENTRANDOM, TOBRATROUGH, TOBRAPEAK, TOBRARND, AMIKACINPEAK, AMIKACINTROU, AMIKACIN in the last 72 hours.   Microbiology: Recent Results (from the past 720 hour(s))  Blood culture (routine x 2)     Status: None (Preliminary result)   Collection Time: 06/16/15  2:58 PM  Result Value Ref Range Status   Specimen Description BLOOD LEFT ANTECUBITAL  Final   Special Requests BOTTLES DRAWN AEROBIC AND ANAEROBIC 6CC EACH  Final   Culture NO GROWTH 2 DAYS  Final   Report Status PENDING  Incomplete  Blood culture (routine x 2)     Status: None   Collection Time: 06/16/15  3:12 PM  Result Value Ref Range Status   Specimen Description BLOOD RIGHT  ANTECUBITAL  Final   Special Requests   Final    BOTTLES DRAWN AEROBIC AND ANAEROBIC AEB=6CC ANA=5CC   Culture  Setup Time   Final    GRAM POSITIVE COCCI IN CHAINS FROM THE ANAEROBIC BOTTLE Gram Stain Report Called to,Read Back By and Verified With: MAYS,J. AT 1742 ON 06/17/2015 BY BAUGHAM,M. Performed at [facility]    Culture   Final    STREPTOCOCCUS AGALACTIAE Performed at [facility]    Report Status 06/19/2015 FINAL  Final   Organism ID, Bacteria STREPTOCOCCUS AGALACTIAE  Final      Susceptibility   Streptococcus agalactiae - MIC*    CLINDAMYCIN >=1 RESISTANT Resistant     AMPICILLIN <=0.25 SENSITIVE Sensitive     ERYTHROMYCIN >=8 RESISTANT Resistant     VANCOMYCIN 0.5 SENSITIVE Sensitive     CEFTRIAXONE <=0.12 SENSITIVE Sensitive     LEVOFLOXACIN 1 SENSITIVE Sensitive     * STREPTOCOCCUS AGALACTIAE  Culture, blood (routine x 2)     Status: None (Preliminary result)   Collection Time: 06/17/15  7:11 AM  Result Value Ref Range Status   Specimen Description BLOOD LEFT ARM  Final   Special Requests BOTTLES DRAWN AEROBIC AND ANAEROBIC 10CC  Final   Culture NO GROWTH 1 DAY  Final   Report Status PENDING  Incomplete  Culture, blood (routine x 2)     Status: None (Preliminary result)   Collection Time: 06/17/15  7:19 AM  Result Value Ref Range Status   Specimen Description BLOOD RIGHT HAND  Final   Special  Requests BOTTLES DRAWN AEROBIC AND ANAEROBIC 10CC  Final   Culture NO GROWTH 1 DAY  Final   Report Status PENDING  Incomplete  Surgical pcr screen     Status: None   Collection Time: 06/18/15 10:10 PM  Result Value Ref Range Status   MRSA, PCR NEGATIVE NEGATIVE Final   Staphylococcus aureus NEGATIVE NEGATIVE Final    Comment:        The Xpert SA Assay (FDA approved for NASAL specimens in patients over 72 years of age), is one component of a comprehensive surveillance program.  Test performance has been validated by [facility] for patients  greater than or equal to 40 year old. It is not intended to diagnose infection nor to guide or monitor treatment.    Medical History: Past Medical History  Diagnosis Date  . PVC (premature ventricular contraction)   . PAC (premature atrial contraction)   . Diabetes mellitus without complication   . Anxiety   . Depression   . Blood clot in vein     pt sts" it may have been in artery and not vein" of right leg  . GERD (gastroesophageal reflux disease)   . H/O hiatal hernia   . Barrett's esophagus   . Arthritis   . Neuropathy   . Herniated disc, cervical     c5 and c6 and c6 and c7  . Hypertension   . Kidney failure     stage 4 just dx 4 months ago.Sees Dr Elby Beck, nephrologist in [place] [phone].  .  . 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 Diabetic retinopathy of both eyes, associated with type 2 diabetes mellitus   . Osteomyelitis of right foot 06/17/2015  . Diabetic foot infection 06/16/2015   Anti-infectives    Start     Dose/Rate Route Frequency Ordered Stop   06/17/15 1000  ciprofloxacin (CIPRO) IVPB 400 mg     400 mg 200 mL/hr over 60 Minutes Intravenous Every 24 hours 06/17/15 0827     06/16/15 2300  clindamycin (CLEOCIN) IVPB 600 mg     600 mg 100 mL/hr over 30 Minutes Intravenous 3 times per day 06/16/15 1615     06/16/15 1500  clindamycin (CLEOCIN) IVPB 900 mg     900 mg 100 mL/hr over 30 Minutes Intravenous  Once 06/16/15 1458 06/16/15 1647     Assessment: 38yo male admitted with fever 101, tachycardia and infected foot wound.  Pt on Clindamycin, and Cipro.  Drug allergies noted.   Renal function stable (poor).    Goal of Therapy:  Eradicate infection.  Plan:  Continue Clindamycin per MD Continue Cipro 400mg  IV q24hrs Monitor labs, renal fxn, and c/s Deescalate / switch to PO ABX when improved / appropriate  Mady Gemma 06/19/2015,12:27 PM

## 2015-06-19 NOTE — Progress Notes (Signed)
[facility] PROGRESS NOTE  CHAYNE BAUMGART WUJ:[id number] DOB: Jan 21, 1976 DOA: 06/16/2015 PCP: PROVIDER NOT IN SYSTEM    Code Status: Full code Family Communication: Discussed with wife and sister. Disposition Plan: Discharge when clinically appropriate   Consultants:  Podiatry  Nephrology  Procedures:  06/19/15: Extensive operative debridement of right foot wound, Dr. [name].  Antibiotics:  Cipro 7/20>>  Clindamycin 7/19>>  HPI/Subjective: Patient is resting following surgery today. He has no complaints of pain, shortness of breath, or diarrhea.  Objective: Filed Vitals:   06/19/15 1241  BP: 141/90  Pulse: 75  Temp: 97.6 F (36.4 C)  Resp: 17   oxygen saturation 100% on room air.  Intake/Output Summary (Last 24 hours) at 06/19/15 1702 Last data filed at 06/19/15 1033  Gross per 24 hour  Intake 3422.5 ml  Output   1715 ml  Net 1707.5 ml   Filed Weights   06/18/15 0500 06/19/15 0527 06/19/15 0757  Weight: 109.725 kg (241 lb 14.4 oz) 106.913 kg (235 lb 11.2 oz) 106.595 kg (235 lb)    Exam:   General:  39 year old Caucasian man in no acute distress.  Cardiovascular: S1, S2, no murmurs rubs or gallops.  Respiratory: Decreased breath sounds in the bases, otherwise clear.  Abdomen: Positive bowel sounds, soft, nontender, nondistended.  Musculoskeletal/extremities: Bandage on the right leg/foot (not taken off, status post operative debridement) decrease in edema noted of right lower extremity.  Neurologic: He is alert and oriented 3. Cranial nerves II through XII are intact.  Data Reviewed: Basic Metabolic Panel:  Recent Labs Lab 06/15/15 1315 06/16/15 1512 06/17/15 0711 06/18/15 0516 06/19/15 0612  NA 133* 127* 130* 131* 134*  K 5.1 4.4 4.1 3.8 3.8  CL 99* 94* 101 102 105  CO2 24 23 20* 21* 23  GLUCOSE 306* 295* 140* 148* 202*  BUN 53* 50* 45* 47* 47*  CREATININE 4.26* 4.48* 4.20* 4.38* 4.17*  CALCIUM 8.4* 8.2* 7.9* 7.9* 7.9*  PHOS   --   --   --  5.3*  --    Liver Function Tests:  Recent Labs Lab 06/15/15 1315 06/16/15 1512 06/18/15 0516  AST ALT ALKPHOS 123 112 113  BILITOT 0.4 0.4 0.4  PROT 7.1 7.3 7.0  ALBUMIN 2.0* 2.0* 1.7*   No results for input(s): LIPASE, AMYLASE in the last 168 hours. No results for input(s): AMMONIA in the last 168 hours. CBC:  Recent Labs Lab 06/15/15 1315 06/16/15 1512 06/17/15 0711 06/18/15 0516 06/18/15 2214 06/19/15 0612  WBC 17.7* 22.7* 14.2* 11.4* 10.7* 9.2  NEUTROABS 14.1*  --   --   --  6.7  --   HGB 9.3* 8.6* 7.8* 7.7* 10.1* 9.5*  HCT 28.8* 24.9* 23.3* 23.0* 29.2* 28.3*  MCV 87.5 84.7 85.7 85.8 84.6 84.7  PLT 481* 369 333 339 323 343   Cardiac Enzymes: No results for input(s): CKTOTAL, CKMB, CKMBINDEX, TROPONINI in the last 168 hours. BNP (last 3 results) No results for input(s): BNP in the last 8760 hours.  ProBNP (last 3 results) No results for input(s): PROBNP in the last 8760 hours.  CBG:  Recent Labs Lab 06/18/15 2054 06/19/15 0719 06/19/15 0807 06/19/15 1307 06/19/15 1626  GLUCAP 207* 183* 163* 208* 240*    Recent Results (from the past 240 hour(s))  Blood culture (routine x 2)     Status: None (Preliminary result)   Collection Time: 06/16/15  2:58 PM  Result Value Ref Range Status  Specimen Description BLOOD LEFT ANTECUBITAL  Final   Special Requests BOTTLES DRAWN AEROBIC AND ANAEROBIC 6CC EACH  Final   Culture NO GROWTH 3 DAYS  Final   Report Status PENDING  Incomplete  Blood culture (routine x 2)     Status: None   Collection Time: 06/16/15  3:12 PM  Result Value Ref Range Status   Specimen Description BLOOD RIGHT ANTECUBITAL  Final   Special Requests   Final    BOTTLES DRAWN AEROBIC AND ANAEROBIC AEB=6CC ANA=5CC   Culture  Setup Time   Final    GRAM POSITIVE COCCI IN CHAINS FROM THE ANAEROBIC BOTTLE Gram Stain Report Called to,Read Back By and Verified With: MAYS,J. AT 1742 ON 06/17/2015 BY  BAUGHAM,M. Performed at [facility]    Culture   Final    STREPTOCOCCUS AGALACTIAE Performed at [facility]    Report Status 06/20/2015 FINAL  Final   Organism ID, Bacteria STREPTOCOCCUS AGALACTIAE  Final      Susceptibility   Streptococcus agalactiae - MIC*    CLINDAMYCIN >=1 RESISTANT Resistant     AMPICILLIN <=0.25 SENSITIVE Sensitive     ERYTHROMYCIN >=8 RESISTANT Resistant     VANCOMYCIN 0.5 SENSITIVE Sensitive     CEFTRIAXONE <=0.12 SENSITIVE Sensitive     LEVOFLOXACIN 1 SENSITIVE Sensitive     * STREPTOCOCCUS AGALACTIAE  Culture, blood (routine x 2)     Status: None (Preliminary result)   Collection Time: 06/17/15  7:11 AM  Result Value Ref Range Status   Specimen Description BLOOD LEFT ARM  Final   Special Requests BOTTLES DRAWN AEROBIC AND ANAEROBIC 10CC  Final   Culture NO GROWTH 2 DAYS  Final   Report Status PENDING  Incomplete  Culture, blood (routine x 2)     Status: None (Preliminary result)   Collection Time: 06/17/15  7:19 AM  Result Value Ref Range Status   Specimen Description BLOOD RIGHT HAND  Final   Special Requests BOTTLES DRAWN AEROBIC AND ANAEROBIC 10CC  Final   Culture NO GROWTH 2 DAYS  Final   Report Status PENDING  Incomplete  Surgical pcr screen     Status: None   Collection Time: 06/18/15 10:10 PM  Result Value Ref Range Status   MRSA, PCR NEGATIVE NEGATIVE Final   Staphylococcus aureus NEGATIVE NEGATIVE Final    Comment:        The Xpert SA Assay (FDA approved for NASAL specimens in patients over 83 years of age), is one component of a comprehensive surveillance program.  Test performance has been validated by [facility] for patients greater than or equal to 31 year old. It is not intended to diagnose infection nor to guide or monitor treatment.      Studies: Dg Foot Complete Right  06/20/15   CLINICAL DATA:  Postoperative debridement.  EXAM: RIGHT FOOT COMPLETE - 3+ VIEW  COMPARISON:  June 16, 2015.  FINDINGS:  Status post old surgical resection of distal fourth metatarsal is noted. There is been interval surgical resection of the residual portion of the fifth metatarsal. Large soft tissue defect is seen laterally in the right midfoot consistent with surgery. Wound VAC is seen over this site. Degenerative changes seen involving the first metatarsophalangeal and interphalangeal joints.  IMPRESSION: Status post surgical resection of residual portion of fifth metatarsal with wound VAC over this site. No other areas of lytic destruction are seen to suggest osteomyelitis.   Electronically Signed   By: Lupita Raider, M.D.  On: 06/19/2015 11:27    Scheduled Meds: . ALPRAZolam  0.5 mg Oral TID  . ciprofloxacin  400 mg Intravenous Q24H  . clindamycin (CLEOCIN) IV  600 mg Intravenous 3 times per day  . cyclobenzaprine  10 mg Oral BID  . ferrous sulfate  325 mg Oral Q breakfast  . furosemide  20 mg Oral Daily  . insulin aspart  0-15 Units Subcutaneous TID WC  . insulin aspart  0-5 Units Subcutaneous QHS  . insulin aspart  4 Units Subcutaneous TID WC  . insulin glargine  15 Units Subcutaneous BID  . metoprolol  50 mg Oral BID  . pantoprazole  80 mg Oral Daily  . sodium bicarbonate  650 mg Oral TID  . sodium chloride  3 mL Intravenous Q12H  . Vilazodone HCl  20 mg Oral QHS   Continuous Infusions: . sodium chloride 50 mL/hr at 06/19/15 0330    Assessment and plan:  Principal Problem:   Sepsis Active Problems:   Diabetic foot infection   Osteomyelitis of right foot   Gram-positive cocci bacteremia   Acute renal failure superimposed on stage 4 chronic kidney disease   Diabetes mellitus with complication   Lower extremity edema   Hyponatremia   Anemia, chronic disease   Essential hypertension   Depression with anxiety   GERD (gastroesophageal reflux disease)    1. Sepsis secondary to right diabetic foot infection/abscess/osteomyelitis. On admission, the patient was febrile with a temperature  101, tachycardic with a heart rate of 110, and with an obvious infected foot wound. X-ray of his right foot revealed soft tissue infection involving the lateral foot with gas visible; no osteomyelitis seen. However, follow-up MRI revealed evidence of soft tissue ulceration/edema of the lateral right foot consistent with cellulitis and marrow edema within the fifth metatarsal stop within 8 mm fluid collection an appearance concerning for osteomyelitis. His edema is likely secondary to the infection; it is now subsiding. -The patient was started on clindamycin, given his multiple anabolic allergies. Cipro was added the next day for additional coverage. -I discussed the patient's case with ID physician on call, Dr. Enedina Finner on 7/20.Marland Kitchen Per our conversation, clindamycin and Cipro are good choices in the setting of multiple antibiotic allergies. However, if patient continued to spike fevers through Cipro and clindamycin over the next 24-48 hours, he suggested stopping clindamycin and starting Zyvox. If debridement occurs, he recommended continuing Cipro and Zyvox, both of which can be given orally, for 3-4 weeks. If the patient ends up needing an amputation, he would recommend Cipro and Zyvox for 1- 2 weeks following. -Dr. [name] was consulted. He performed an extensive debridement of the right foot wound/infection. Application of VAC was attempted, but was not successful. Dressing changes and management per Dr. [name]. -Patient's fever curve has trended downward towards normal.. His white blood cell count has improved progressively and is within normal limits.  Gram-positive cocci bacteremia. One out of 4 cultures so far is growing Streptococcus Agalactiae. We'll continue current antibiotics.  Normocytic anemia, secondary to chronic disease and iron deficiency. Patient's hemoglobin was 9.3 on admission. Following IV fluids, it decreased to nadir of 7.7. The decrease was in part due to hemodilution from  IV fluids, from acute infection, and chronic kidney disease. His anemia panel revealed a total iron of 6, TIBC 143, ferritin 236, folate 25, and vitamin B12 520. -Ferrous sulfate 325 mg daily was started. He was transfused 2 units of packed red blood cells on 7/21 prior to anticipated  surgery. His hemoglobin improved appropriately. The patient may need Epogen, but this will be deferred to nephrology.  Acute renal failure superimposed on stage IV chronic kidney disease. Patient reported that his baseline creatinine ranges from the upper 3s to the lower 4s. He is followed by nephrologist, Dr. Felipa Furnace in [place]. Per his history, he is being considered for a renal transplant at [facility]. He and his nephrologist are trying to hold off on dialysis pending being listed for the transplantation. Patient states that he still makes urine. His creatinine was 4.26 on admission. -Patient was started on vigorous IV fluids, with minimal improvement in his creatinine. IV fluids were decreased. His -Urine output is nonoliguric. -His renal ultrasound was unremarkable. -Continue to hold lisinopril, but continue oral Lasix unless nephrology recommends holding it. -Nephrology consulted and agreed with current management. Sodium bicarbonate added for mild metabolic acidosis. -Records and recent labs from his nephrologist Dr. Felipa Furnace pending.  Hyponatremia. Patient's serum sodium was 133 on admission. Following vigorous IV fluids, it has decreased. IV fluid rate was decreased. Serum sodium has slightly improved.  Insulin-dependent diabetes mellitus with nephropathy, neuropathy, and PVD. The patient is treated chronically with sliding scale NovoLog and Lantus. His hemoglobin A1c was 10.1 indicating suboptimal outpatient control. Sliding scale NovoLog and Lantus dosing will continue to be adjusted to avoid hypoglycemia and/or extreme hyperglycemia. His CBGs are trending up.  Essential hypertension. Patient is treated with  lisinopril, Lasix and metoprolol chronically. Lisinopril is being held, given his worsening renal function. We'll continue metoprolol and Lasix as ordered. His blood pressure is reasonable.  Depression with anxiety. Currently stable. Will continue chronic medications.    Time spent: 30 minutes   [facility]  [facility] Pager [phone]. If 7PM-7AM, please contact night-coverage at [email or web address], password [facility] 06/19/2015, 5:02 PM  LOS: 2 days

## 2015-06-20 ENCOUNTER — Encounter (HOSPITAL_COMMUNITY): Payer: Self-pay | Admitting: Internal Medicine

## 2015-06-20 LAB — RENAL FUNCTION PANEL
ALBUMIN: 1.7 g/dL — AB (ref 3.5–5.0)
ANION GAP: 6 (ref 5–15)
BUN: 43 mg/dL — ABNORMAL HIGH (ref 6–20)
CALCIUM: 8 mg/dL — AB (ref 8.9–10.3)
CO2: 23 mmol/L (ref 22–32)
Chloride: 107 mmol/L (ref 101–111)
Creatinine, Ser: 4.01 mg/dL — ABNORMAL HIGH (ref 0.61–1.24)
GFR, EST AFRICAN AMERICAN: 20 mL/min — AB (ref >60)
GFR, EST NON AFRICAN AMERICAN: 17 mL/min — AB (ref >60)
Glucose, Bld: 141 mg/dL — ABNORMAL HIGH (ref 65–99)
PHOSPHORUS: 5.7 mg/dL — AB (ref 2.5–4.6)
POTASSIUM: 4.1 mmol/L (ref 3.5–5.1)
SODIUM: 136 mmol/L (ref 135–145)

## 2015-06-20 LAB — CBC
HCT: 28 % — ABNORMAL LOW (ref 39.0–52.0)
Hemoglobin: 9.5 g/dL — ABNORMAL LOW (ref 13.0–17.0)
MCH: 28.9 pg (ref 26.0–34.0)
MCHC: 33.9 g/dL (ref 30.0–36.0)
MCV: 85.1 fL (ref 78.0–100.0)
Platelets: 346 10*3/uL (ref 150–400)
RBC: 3.29 MIL/uL — AB (ref 4.22–5.81)
RDW: 13.8 % (ref 11.5–15.5)
WBC: 10.5 10*3/uL (ref 4.0–10.5)

## 2015-06-20 LAB — GLUCOSE, CAPILLARY
GLUCOSE-CAPILLARY: 133 mg/dL — AB (ref 65–99)
GLUCOSE-CAPILLARY: 169 mg/dL — AB (ref 65–99)

## 2015-06-20 MED ORDER — FERROUS SULFATE 325 (65 FE) MG PO TABS: 325.0000 mg | ORAL_TABLET | Freq: Every day | ORAL | Status: DC

## 2015-06-20 MED ORDER — CLINDAMYCIN HCL 300 MG PO CAPS: 300.0000 mg | ORAL_CAPSULE | Freq: Three times a day (TID) | ORAL | Status: DC

## 2015-06-20 MED ORDER — LISINOPRIL 20 MG PO TABS: ORAL_TABLET | ORAL | Status: DC

## 2015-06-20 MED ORDER — SODIUM BICARBONATE 650 MG PO TABS: 650.0000 mg | ORAL_TABLET | Freq: Two times a day (BID) | ORAL | Status: DC

## 2015-06-20 MED ORDER — CIPROFLOXACIN HCL 500 MG PO TABS: 500.0000 mg | ORAL_TABLET | Freq: Every day | ORAL | Status: DC

## 2015-06-20 NOTE — Progress Notes (Signed)
Pt's IV catheter removed and intact. Pt's IV site clean dry and intact. Discharge instructions, medications, and follow up appointments reviewed and discussed with patient. All questions answered and no further questions at this time. Pt in stable condition and in no acute distress at this time. Pt escorted by nurse tech.

## 2015-06-20 NOTE — Progress Notes (Signed)
Podiatry Progress Note  Subjective: Maurice [name] is a 39 y.o. male S/P debridement of the right foot with Wound VAC application yesterday.  His pain is controlled.  His VAC functioned well through the night.  Past Medical History  Diagnosis Date  . PVC (premature ventricular contraction)   . PAC (premature atrial contraction)   . Diabetes mellitus without complication   . Anxiety   . Depression   . Blood clot in vein     pt sts" it may have been in artery and not vein" of right leg  . GERD (gastroesophageal reflux disease)   . H/O hiatal hernia   . Barrett's esophagus   . Arthritis   . Neuropathy   . Herniated disc, cervical     c5 and c6 and c6 and c7  . Hypertension   . Kidney failure     stage 4 just dx 4 months ago.Sees Dr Elby Beck, nephrologist in [place] [phone].  .  . 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 Diabetic retinopathy of both eyes, associated with type 2 diabetes mellitus   . Osteomyelitis of right foot 06/17/2015  . Diabetic foot infection 06/16/2015   Scheduled Meds: . ALPRAZolam  0.5 mg Oral TID  . ciprofloxacin  400 mg Intravenous Q24H  . clindamycin (CLEOCIN) IV  600 mg Intravenous 3 times per day  . cyclobenzaprine  10 mg Oral BID  . ferrous sulfate  325 mg Oral Q breakfast  . furosemide  20 mg Oral Daily  . insulin aspart  0-15 Units Subcutaneous TID WC  . insulin aspart  0-5 Units Subcutaneous QHS  . insulin aspart  4 Units Subcutaneous TID WC  . insulin glargine  15 Units Subcutaneous BID  . metoprolol  50 mg Oral BID  . pantoprazole  80 mg Oral Daily  . sodium bicarbonate  650 mg Oral TID  . sodium chloride  3 mL Intravenous Q12H  . Vilazodone HCl  20 mg Oral QHS   Continuous Infusions: . sodium chloride 50 mL/hr at 06/19/15 2120   PRN Meds:.acetaminophen **OR** acetaminophen, hydrALAZINE, morphine injection, ondansetron (ZOFRAN) IV, oxyCODONE  Allergies  Allergen Reactions  . Daptomycin    Hypotension   . Bactrim [Sulfamethoxazole-Trimethoprim] Other (See Comments)    Cannot take due to kidney issue  . Dapsone Nausea And Vomiting  . Latex Other (See Comments)    Blisters.   . Penicillins Hives  . Vancomycin Itching   Past Surgical History  Procedure Laterality Date  . Wrist arthroscopy      left with no break reapir of tendond and ligamants  . Toe amputation      5th digit right foot-[place]  . Debridement leg      lower leg-left  . Metatarsal head excision Right 02/14/2013    Procedure: 4TH METATARSAL HEAD RESECTION RIGHT FOOT;  Surgeon: Dallas Schimke, DPM;  Location: [facility];  Service: Orthopedics;  Laterality: Right;  latex allergy  . Vitrectomy Right    Family History  Problem Relation Age of Onset  . Cancer Mother     liver lung  . Cancer Father     liver  . Diabetes Mother   . Diabetes Father    Social History:  reports that he has been smoking Cigarettes.  He has a 5 pack-year smoking history. His smokeless tobacco use includes Chew. He reports that he drinks alcohol. He reports that he does not use illicit  drugs.  Physical Examination: Vital signs in last 24 hours:   Temp:  [97.6 F (36.4 C)-98.4 F (36.9 C)] 98.2 F (36.8 C) (07/23 0600) Pulse Rate:  [64-83] 79 (07/23 0821) Resp:  [17-18] 18 (07/23 0821) BP: (133-161)/(67-99) 161/87 mmHg (07/23 0821) SpO2:  [98 %-100 %] 100 % (07/23 0821) Weight:  [238 lb (107.956 kg)] 238 lb (107.956 kg) (07/23 0600)  DRESSING:  Wound VAC operational at 125 mmHg continous pressure. INTEGUMENT:  Surgical wound is progressing well.  The wound is free of necrotic tissue.  Erythema has improved.   VASCULAR:   Pedal pulses are palpable.  The edema of his right lower extremity has improved.    Lab/Test Results:    Recent Labs  06/19/15 0612 06/20/15 0556  WBC 9.2 10.5  HGB 9.5* 9.5*  HCT 28.3* 28.0*  PLT 343 346  NA 134* 136  K 3.8 4.1  CL 105 107  CO2 23 23  BUN 47* 43*  CREATININE 4.17*  4.01*  GLUCOSE 202* 141*  CALCIUM 7.9* 8.0*    Recent Results (from the past 240 hour(s))  Blood culture (routine x 2)     Status: None (Preliminary result)   Collection Time: 06/16/15  2:58 PM  Result Value Ref Range Status   Specimen Description BLOOD LEFT ANTECUBITAL  Final   Special Requests BOTTLES DRAWN AEROBIC AND ANAEROBIC 6CC EACH  Final   Culture NO GROWTH 4 DAYS  Final   Report Status PENDING  Incomplete  Blood culture (routine x 2)     Status: None   Collection Time: 06/16/15  3:12 PM  Result Value Ref Range Status   Specimen Description BLOOD RIGHT ANTECUBITAL  Final   Special Requests   Final    BOTTLES DRAWN AEROBIC AND ANAEROBIC AEB=6CC ANA=5CC   Culture  Setup Time   Final    GRAM POSITIVE COCCI IN CHAINS FROM THE ANAEROBIC BOTTLE Gram Stain Report Called to,Read Back By and Verified With: MAYS,J. AT 1742 ON 06/17/2015 BY BAUGHAM,M. Performed at [facility]    Culture   Final    STREPTOCOCCUS AGALACTIAE Performed at [facility]    Report Status 06/19/2015 FINAL  Final   Organism ID, Bacteria STREPTOCOCCUS AGALACTIAE  Final      Susceptibility   Streptococcus agalactiae - MIC*    CLINDAMYCIN >=1 RESISTANT Resistant     AMPICILLIN <=0.25 SENSITIVE Sensitive     ERYTHROMYCIN >=8 RESISTANT Resistant     VANCOMYCIN 0.5 SENSITIVE Sensitive     CEFTRIAXONE <=0.12 SENSITIVE Sensitive     LEVOFLOXACIN 1 SENSITIVE Sensitive     * STREPTOCOCCUS AGALACTIAE  Culture, blood (routine x 2)     Status: None (Preliminary result)   Collection Time: 06/17/15  7:11 AM  Result Value Ref Range Status   Specimen Description BLOOD LEFT ARM  Final   Special Requests BOTTLES DRAWN AEROBIC AND ANAEROBIC 10CC  Final   Culture NO GROWTH 3 DAYS  Final   Report Status PENDING  Incomplete  Culture, blood (routine x 2)     Status: None (Preliminary result)   Collection Time: 06/17/15  7:19 AM  Result Value Ref Range Status   Specimen Description BLOOD RIGHT HAND  Final    Special Requests BOTTLES DRAWN AEROBIC AND ANAEROBIC 10CC  Final   Culture NO GROWTH 3 DAYS  Final   Report Status PENDING  Incomplete  Surgical pcr screen     Status: None   Collection Time: 06/18/15 10:10 PM  Result Value Ref Range Status   MRSA, PCR NEGATIVE NEGATIVE Final   Staphylococcus aureus NEGATIVE NEGATIVE Final    Comment:        The Xpert SA Assay (FDA approved for NASAL specimens in patients over 88 years of age), is one component of a comprehensive surveillance program.  Test performance has been validated by [facility] for patients greater than or equal to 69 year old. It is not intended to diagnose infection nor to guide or monitor treatment.      Dg Foot Complete Right  06/19/2015   CLINICAL DATA:  Postoperative debridement.  EXAM: RIGHT FOOT COMPLETE - 3+ VIEW  COMPARISON:  June 16, 2015.  FINDINGS: Status post old surgical resection of distal fourth metatarsal is noted. There is been interval surgical resection of the residual portion of the fifth metatarsal. Large soft tissue defect is seen laterally in the right midfoot consistent with surgery. Wound VAC is seen over this site. Degenerative changes seen involving the first metatarsophalangeal and interphalangeal joints.  IMPRESSION: Status post surgical resection of residual portion of fifth metatarsal with wound VAC over this site. No other areas of lytic destruction are seen to suggest osteomyelitis.   Electronically Signed   By: Lupita Raider, M.D.   On: 06/19/2015 11:27    Assessment: S/P debridement of the right foot.  Plan: 1.  Wound VAC changed.   2.  Okay for discharge from my standpoint.  This was discussed with Dr. Sherrie Mustache. 3.  Antibiotics per Dr. Sherrie Mustache.  I will change according to ID recommendations if he develops GI complaints. 4.  He is NWB on his right foot. 5.  He is to follow up with me at my [place] office on Monday for Wound VAC dressing change.    The patient has been made aware of  this appointment and all restrictions.  I advised him to page me over the weekend if there are problems with his Wound VAC.  My office will arrange home health on Monday.    Thank you for your assistance with Mr. Gago.   Penina Reisner IVAN 06/20/2015, 12:36 PM

## 2015-06-20 NOTE — Discharge Summary (Signed)
Physician Discharge Summary  Maurice [name] ZOX:[id number] DOB: Aug 11, 1976 DOA: 06/16/2015  PCP: PROVIDER NOT IN SYSTEM  Podiatry, Dr. [name].  Nephrologist, Dr. Felipa Furnace in [place].  Admit date: 06/16/2015 Discharge date: 06/20/2015  Time spent: Greater than 30 minutes  Recommendations for Outpatient Follow-up:  1. Patient will follow up with podiatrist Dr. [name] who will order home health wound care following his reassessment in the office. 2. Recommend follow-up of foot culture pending. 3. Recommend reassessing the patient's renal function in the next week or 2. Further management will be deferred to his nephrologist, Dr. Felipa Furnace.    Discharge Diagnoses:   1. Sepsis secondary to right diabetic foot ulcer and osteomyelitis of the right foot. --- Status post extensive debridement and application of wound VAC by podiatrist, Dr. [name]. 2. 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 3. Acute anemia secondary to iron deficiency, infection, hemodilution, superimposed on anemia of chronic disease. --- Anemia panel revealed a total iron of 6, TIBC of 143, ferritin of 236, folate of 25, and vitamin B12 of 520. --- Patient's hemoglobin was 9.3 on 06/15/2015, but fell to a nadir of 7.7. --- Status post 2 units of packed red blood cell transfusion. Hemoglobin 9.5 at discharge. 4. 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 --- Hemoglobin A1c was 10.1. 5. Hyponatremia secondary to hypovolemia. 6. Essential hypertension. 7. Depression with anxiety. 8. GERD. 9. Chronic pain syndrome, on chronic opiates.   Discharge Condition: Improved.  Diet recommendation: Heart healthy, carbohydrate modified.  Filed Weights   06/19/15 0527 06/19/15 0757 06/20/15 0600  Weight: 106.913 kg (235 lb 11.2 oz) 106.595 kg (235 lb) 107.956 kg (238 lb)    History of present illness:  Patient is a 39 year old man with a chronic ongoing right foot infection,  status post resection of the fifth metatarsal in the past, history of insulin-dependent diabetes mellitus, chronic kidney disease, chronic pain syndrome, and hypertension. He presented to the emergency department on 06/16/2015 with worsening right foot ulcer/infection and fever following failed treatment with doxycycline and local wound debridement in the office by Dr. [name]. In the ED, he was febrile with a temperature of 101. He was mildly tachycardic, but his blood pressure was within normal limits. His lab data were significant for serum sodium of 127, BUN of 50, creatinine of 4.48, WBC of 22.7, hemoglobin of 9.6, normal lactic acid level, and glucose of 295. 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 He was admitted for further evaluation and management.   Hospital Course:   1. Sepsis secondary to right diabetic foot infection/abscess/osteomyelitis. On admission, the patient was febrile with a temperature 101, tachycardic with a heart rate of 110, and with an obvious infected foot wound. His white blood cell count was greater than 22,000. X-ray of his right foot revealed soft tissue infection involving the lateral foot with gas visible; no osteomyelitis seen. However, MRI revealed evidence of soft tissue ulceration/edema of the lateral right foot consistent with cellulitis and marrow edema within the fifth metatarsal stump with a small 8 mm fluid collection adjacent to the lateral cortex; an appearance concerning for osteomyelitis. -The patient was started on clindamycin, given his multiple anabolic allergies. Cipro was added the next day for additional coverage. -Dr. [name] was consulted. He performed an extensive debridement of the right foot wound/infection. Dressing changes and application of the wound VAC were performed as  well.  -Patient's fever curve  trended downward and he became completely afebrile. His white blood cell count completely normal otherwise. -The blood cultures drawn on admission grew out only Streptococcus Agalactiae.  -Wound cultures from the debridement were pending at the time of discharge. -I discussed the patient's case with ID physician on call, Dr. Enedina Finner. Per our conversation, clindamycin and Cipro were good choices in the setting of multiple antibiotic allergies however he was concerned about the potential for the patient acquiring C. difficile infection. The patient reported taking clindamycin in the past with probiotics and he had never had C. difficile colitis. Dr. Ninetta Lights recommended 4 weeks of oral therapy with clindamycin and Cipro or Zyvox and Cipro given that the patient had debridement rather than an amputation. -Patient will follow-up with Dr. [name] for further wound care management. Normocytic anemia, secondary to chronic disease and iron deficiency. Patient's hemoglobin was 9.3 on 06/15/15.  Following IV fluids, it decreased to nadir of 7.7. The decrease was in part due to hemodilution from IV fluids, from acute infection, and chronic kidney disease. His anemia panel revealed a total iron of 6, TIBC 143, ferritin 236, folate 25, and vitamin B12 520. -Ferrous sulfate 325 mg daily was started. He was transfused 2 units of packed red blood cells on 7/21 prior to anticipated surgery. His hemoglobin improved appropriately. The patient may need outpatient Epogen, but this will be deferred to his nephrologist.  Acute renal failure superimposed on stage IV chronic kidney disease. Patient reported that his baseline creatinine ranges from the upper 3s to the lower 4s. He is followed by nephrologist, Dr. Felipa Furnace in [place]. Per his history, he is being considered for a renal transplant at [facility]. He and his nephrologist are trying to hold off on dialysis pending being listed for the transplantation. Patient  still makes  urine. His creatinine was 4.26 on admission. -Patient was started on vigorous IV fluids, with minimal improvement in his creatinine. IV fluids were decreased.  -Lisinopril was withheld during hospital course. -His urine output was nonoliguric. -His renal ultrasound was unremarkable. -Nephrology was consulted and agreed with the management. Sodium bicarbonate was added for mild metabolic acidosis. -Patient's creatinine improved to 4.01 at discharge. -Patient was advised to follow-up with Dr. Felipa Furnace for further management. Hyponatremia. Patient's serum sodium was 133 on admission. Following vigorous IV fluids, it had decreased. IV fluid rate was decreased. His serum sodium normalized to 136 prior to discharge. Insulin-dependent diabetes mellitus with nephropathy, neuropathy, and PVD. The patient is treated chronically with sliding scale NovoLog and Lantus. His hemoglobin A1c was 10.1 indicating suboptimal outpatient control. Sliding scale NovoLog and Lantus dosing was adjusted to avoid hypoglycemia and/or extreme hyperglycemia. The patient was discharged on his home regimen for his PCP and/or his nephrologist to adjust the regimen accordingly rather than trying to micromanage his diabetes in the hospital with an active infection. The patient actually preferred this. Essential hypertension. Patient is treated with lisinopril, Lasix and metoprolol chronically. Lisinopril was held during hospitalization given his worsening renal function. Lasix and metoprolol were continued. My plan was to discontinue lisinopril and to start either amlodipine or hydralazine upon discharge, as the patient's blood pressure was trending up. However, the patient was not comfortable with this decision. He wanted to continue lisinopril, pending his conversation with Dr. Felipa Furnace in the next week or 2.   Procedures:  06/19/15: Extensive operative debridement of right foot wound, Dr. [name].  Consultations:  Podiatry, Dr.  [name].  Curbside consultation with  ID, Dr. Ninetta Lights.  Discharge Exam: Filed Vitals:   06/20/15 0821  BP: 161/87  Pulse: 79  Temp:   Resp: 18   temperature 98.2. Oxygen saturation 100% on room air.  General: 39 year old Caucasian man in no acute distress.  Cardiovascular: S1, S2, no murmurs rubs or gallops.  Respiratory: Decreased breath sounds in the bases, otherwise clear.  Abdomen: Positive bowel sounds, soft, nontender, nondistended.  Musculoskeletal/extremities: Bandage on the right foot. Wound VAC in place draining small amount of serosanguineous fluid. Decrease in edema noted of right lower extremity.  Neurologic: He is alert and oriented 3. Cranial nerves II through XII are intact.   Discharge Instructions   Discharge Instructions    Diet - low sodium heart healthy    Complete by:  As directed      Diet general    Complete by:  As directed      Discharge wound care:    Complete by:  As directed   Per Dr. [name].     Increase activity slowly    Complete by:  As directed           Current Discharge Medication List    START taking these medications   Details  ciprofloxacin (CIPRO) 500 MG tablet Take 1 tablet (500 mg total) by mouth daily. Antibiotic to be taken for 4 more weeks. Qty: 30 tablet, Refills: 0    clindamycin (CLEOCIN) 300 MG capsule Take 1 capsule (300 mg total) by mouth 3 (three) times daily. Antibiotic to be taken for 4 more weeks. Qty: 84 capsule, Refills: 0    ferrous sulfate 325 (65 FE) MG tablet Take 1 tablet (325 mg total) by mouth daily with breakfast.    sodium bicarbonate 650 MG tablet Take 1 tablet (650 mg total) by mouth 2 (two) times daily. Qty: 60 tablet, Refills: 6      CONTINUE these medications which have CHANGED   Details  lisinopril (PRINIVIL,ZESTRIL) 20 MG tablet Discuss restarting Lisinopril with your kidney doctor.      CONTINUE these medications which have NOT CHANGED   Details  ALPRAZolam (XANAX) 0.5 MG  tablet Take 0.5 mg by mouth 3 (three) times daily.    cyclobenzaprine (FLEXERIL) 10 MG tablet Take 10 mg by mouth 2 (two) times daily.    furosemide (LASIX) 20 MG tablet Take 20 mg by mouth daily.     insulin glargine (LANTUS) 100 UNIT/ML injection Inject 42 Units into the skin at bedtime.     metoprolol (LOPRESSOR) 50 MG tablet Take 50 mg by mouth 2 (two) times daily.    mometasone (NASONEX) 50 MCG/ACT nasal spray Place 2 sprays into the nose daily as needed (for allergies).     NOVOLOG FLEXPEN 100 UNIT/ML FlexPen Inject 0-75 Units into the skin 3 (three) times daily after meals.  Refills: 1    omega-3 acid ethyl esters (LOVAZA) 1 G capsule Take 1,000 mg by mouth 2 (two) times daily. Refills: 11    omeprazole (PRILOSEC) 40 MG capsule Take 40 mg by mouth daily.    oxycodone (ROXICODONE) 30 MG immediate release tablet Take 30 mg by mouth 3 (three) times daily.     Vilazodone HCl (VIIBRYD) 20 MG TABS Take 20 mg by mouth daily.       Allergies  Allergen Reactions  . Daptomycin     Hypotension   . Bactrim [Sulfamethoxazole-Trimethoprim] Other (See Comments)    Cannot take due to kidney issue  . Dapsone Nausea And Vomiting  .  Latex Other (See Comments)    Blisters.   . Penicillins Hives  . Vancomycin Itching   Follow-up Information    Follow up with Dallas Schimke, DPM.   Specialty:  Podiatry   Why:  Follow up as scheduled   Contact information:   [address] [place] [address] [phone]        The results of significant diagnostics from this hospitalization (including imaging, microbiology, ancillary and laboratory) are listed below for reference.    Significant Diagnostic Studies: US Renal  06/17/2015   CLINICAL DATA:  ATN. Chronic kidney disease stage 4 or 5. Diabetes.  EXAM: RENAL / URINARY TRACT ULTRASOUND COMPLETE  COMPARISON:  None.  FINDINGS: Right Kidney:  Length: 13.7 cm. Echogenicity within normal limits. No mass or hydronephrosis  visualized.  Left Kidney:  Length: 14.2 cm. Echogenicity within normal limits. No mass or hydronephrosis visualized.  Bladder:  Appears normal for degree of bladder distention. Ureteral jets not visualized.  IMPRESSION: Normal size kidneys without evidence of hydronephrosis.   Electronically Signed   By: Elberta Fortis M.D.   On: 06/17/2015 16:23   Mr Foot Right Wo Contrast  06/17/2015   CLINICAL DATA:  Right foot pain and swelling.  Abnormal x-ray.  Hip  EXAM: MRI OF THE RIGHT FOREFOOT WITHOUT CONTRAST  TECHNIQUE: Multiplanar, multisequence MR imaging was performed. No intravenous contrast was administered.  COMPARISON:  X-ray right foot 06/16/2015  FINDINGS: There is significant patient motion degrading image quality limiting evaluation.  There is evidence of fourth and fifth partial metatarsal amputation. There is soft tissue ulceration along the lateral aspect of the left foot overlying the base of the fifth metatarsal. There is soft tissue edema along the lateral aspect of the foot. There is marrow edema within the fifth metatarsal stump with a small adjacent 8 mm fluid collection.  There is no other marrow signal abnormality. 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 There is moderate osteoarthritis of the first MTP joint. There is no acute fracture or dislocation. There is mild T2 signal within the plantar musculature likely neurogenic. There is no other fluid collection or hematoma.  IMPRESSION: Soft tissue ulceration and edema of the lateral aspect of the right foot most concerning for cellulitis. There is marrow edema within the fifth metatarsal stump with a small 8 mm fluid collection adjacent to the lateral cortex. The appearance is most concerning for osteomyelitis.   Electronically Signed   By: Elige Ko   On: 06/17/2015 08:13   Dg Foot Complete Right  06/19/2015   CLINICAL DATA:  Postoperative debridement.  EXAM: RIGHT FOOT COMPLETE - 3+ VIEW  COMPARISON:  June 16, 2015.  FINDINGS:  Status post old surgical resection of distal fourth metatarsal is noted. There is been interval surgical resection of the residual portion of the fifth metatarsal. Large soft tissue defect is seen laterally in the right midfoot consistent with surgery. Wound VAC is seen over this site. Degenerative changes seen involving the first metatarsophalangeal and interphalangeal joints.  IMPRESSION: Status post surgical resection of residual portion of fifth metatarsal with wound VAC over this site. No other areas of lytic destruction are seen to suggest osteomyelitis.   Electronically Signed   By: Lupita Raider, M.D.   On: 06/19/2015 11:27   Dg Foot Complete Right  06/16/2015   CLINICAL DATA:  Diabetic presenting with right foot swelling and a large blister on the lateral side of the right foot.  EXAM: RIGHT FOOT  COMPLETE - 3+ VIEW  COMPARISON:  06/15/2015 and earlier.  FINDINGS: Prior amputation of the 5th ray at the level of the proximal 5th metatarsal. Since yesterday's examination, development of a small amount of gas in the lateral soft tissues overlying the amputated 5th metatarsal. No evidence of underlying osteomyelitis involving the remaining 5th metatarsal. Prior amputation of the head of the 4th metatarsal. No evidence of acute fracture. Severe joint space narrowing and associated hypertrophic spurring involving the IP joint of the great toe. Remaining joint spaces well preserved. No erosions. Mild osseous demineralization.  IMPRESSION: 1. Soft tissue infection involving the lateral foot with gas in the lateral soft tissues which was not visible yesterday. 2. No evidence of osteomyelitis involving the underlying remaining 5th metatarsal or elsewhere. 3. 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   Electronically Signed   By: Hulan Saas M.D.   On: 06/16/2015 16:10   Dg Foot Complete Right  06/15/2015   CLINICAL DATA:  Diabetic foot ulcer  EXAM: RIGHT FOOT COMPLETE - 3+ VIEW   COMPARISON:  Right foot dated February 14, 2013  FINDINGS: The patient has undergone previous amputation of the distal aspect of the fourth metatarsal. The fourth phalanx remains present. The patient has undergone amputation of the fifth toe as well as the distal half of the fifth metatarsal. There is chronic cortical thickening of the proximal phalanx of the great toe. There are interphalangeal joint osteoarthritic changes diffusely greatest at the IP joint of the great toe. There is mild osteoarthritic change of the first MTP joint. There are no bony changes to suggest osteomyelitis of the calcaneus. Due to the weight-bearing nature of the lateral film the extreme inferior or superficial aspect of the heel pad is excluded from the study.  IMPRESSION: There are extensive chronic changes involving the fourth metatarsal and there are post amputation changes of the fifth toe and distal portion of the fifth metatarsal. No definite changes of osteomyelitis of the calcaneus are observed.   Electronically Signed   By: David  [place] M.D.   On: 06/15/2015 14:01    Microbiology: Recent Results (from the past 240 hour(s))  Blood culture (routine x 2)     Status: None (Preliminary result)   Collection Time: 06/16/15  2:58 PM  Result Value Ref Range Status   Specimen Description BLOOD LEFT ANTECUBITAL  Final   Special Requests BOTTLES DRAWN AEROBIC AND ANAEROBIC 6CC EACH  Final   Culture NO GROWTH 4 DAYS  Final   Report Status PENDING  Incomplete  Blood culture (routine x 2)     Status: None   Collection Time: 06/16/15  3:12 PM  Result Value Ref Range Status   Specimen Description BLOOD RIGHT ANTECUBITAL  Final   Special Requests   Final    BOTTLES DRAWN AEROBIC AND ANAEROBIC AEB=6CC ANA=5CC   Culture  Setup Time   Final    GRAM POSITIVE COCCI IN CHAINS FROM THE ANAEROBIC BOTTLE Gram Stain Report Called to,Read Back By and Verified With: MAYS,J. AT 1742 ON 06/17/2015 BY BAUGHAM,M. Performed at [facility]    Culture   Final    STREPTOCOCCUS AGALACTIAE Performed at [facility]    Report Status 06/19/2015 FINAL  Final   Organism ID, Bacteria STREPTOCOCCUS AGALACTIAE  Final      Susceptibility   Streptococcus agalactiae - MIC*    CLINDAMYCIN >=1 RESISTANT Resistant     AMPICILLIN <=0.25 SENSITIVE Sensitive     ERYTHROMYCIN >=  8 RESISTANT Resistant     VANCOMYCIN 0.5 SENSITIVE Sensitive     CEFTRIAXONE <=0.12 SENSITIVE Sensitive     LEVOFLOXACIN 1 SENSITIVE Sensitive     * STREPTOCOCCUS AGALACTIAE  Culture, blood (routine x 2)     Status: None (Preliminary result)   Collection Time: 06/17/15  7:11 AM  Result Value Ref Range Status   Specimen Description BLOOD LEFT ARM  Final   Special Requests BOTTLES DRAWN AEROBIC AND ANAEROBIC 10CC  Final   Culture NO GROWTH 3 DAYS  Final   Report Status PENDING  Incomplete  Culture, blood (routine x 2)     Status: None (Preliminary result)   Collection Time: 06/17/15  7:19 AM  Result Value Ref Range Status   Specimen Description BLOOD RIGHT HAND  Final   Special Requests BOTTLES DRAWN AEROBIC AND ANAEROBIC 10CC  Final   Culture NO GROWTH 3 DAYS  Final   Report Status PENDING  Incomplete  Surgical pcr screen     Status: None   Collection Time: 06/18/15 10:10 PM  Result Value Ref Range Status   MRSA, PCR NEGATIVE NEGATIVE Final   Staphylococcus aureus NEGATIVE NEGATIVE Final    Comment:        The Xpert SA Assay (FDA approved for NASAL specimens in patients over 41 years of age), is one component of a comprehensive surveillance program.  Test performance has been validated by [facility] for patients greater than or equal to 79 year old. It is not intended to diagnose infection nor to guide or monitor treatment.      Labs: Basic Metabolic Panel:  Recent Labs Lab 06/16/15 1512 06/17/15 0711 06/18/15 0516 06/19/15 0612 06/20/15 0556  NA 127* 130* 131* 134* 136  K 4.4 4.1 3.8 3.8 4.1  CL 94* 101 102 105 107   CO2 23 20* 21* 23 23  GLUCOSE 295* 140* 148* 202* 141*  BUN 50* 45* 47* 47* 43*  CREATININE 4.48* 4.20* 4.38* 4.17* 4.01*  CALCIUM 8.2* 7.9* 7.9* 7.9* 8.0*  PHOS  --   --  5.3*  --  5.7*   Liver Function Tests:  Recent Labs Lab 06/15/15 1315 06/16/15 1512 06/18/15 0516 06/20/15 0556  AST 21 18 16   --   ALT 20 20 20   --   ALKPHOS 123 112 113  --   BILITOT 0.4 0.4 0.4  --   PROT 7.1 7.3 7.0  --   ALBUMIN 2.0* 2.0* 1.7* 1.7*   No results for input(s): LIPASE, AMYLASE in the last 168 hours. No results for input(s): AMMONIA in the last 168 hours. CBC:  Recent Labs Lab 06/15/15 1315  06/17/15 0711 06/18/15 0516 06/18/15 2214 06/19/15 0612 06/20/15 0556  WBC 17.7*  < > 14.2* 11.4* 10.7* 9.2 10.5  NEUTROABS 14.1*  --   --   --  6.7  --   --   HGB 9.3*  < > 7.8* 7.7* 10.1* 9.5* 9.5*  HCT 28.8*  < > 23.3* 23.0* 29.2* 28.3* 28.0*  MCV 87.5  < > 85.7 85.8 84.6 84.7 85.1  PLT 481*  < > 333 339 323 343 346  < > = values in this interval not displayed. Cardiac Enzymes: No results for input(s): CKTOTAL, CKMB, CKMBINDEX, TROPONINI in the last 168 hours. BNP: BNP (last 3 results) No results for input(s): BNP in the last 8760 hours.  ProBNP (last 3 results) No results for input(s): PROBNP in the last 8760 hours.  CBG:  Recent Labs Lab  06/19/15 1307 06/19/15 1626 06/19/15 2106 06/20/15 0725 06/20/15 1120  GLUCAP 208* 240* 224* 133* 169*       Signed:  Rimsha Trembley  [facility] 06/20/2015, 1:29 PM

## 2015-06-20 NOTE — Progress Notes (Signed)
Subjective: Patient states that his foot pain is better. At this moment he denies any nausea, or vomiting. No difficulty breathing   Objective: Vital signs in last 24 hours: Temp:  [97.6 F (36.4 C)-98.4 F (36.9 C)] 98.2 F (36.8 C) (07/23 0600) Pulse Rate:  [64-83] 79 (07/23 0821) Resp:  [10-18] 18 (07/23 0821) BP: (133-163)/(67-99) 161/87 mmHg (07/23 0821) SpO2:  [95 %-100 %] 100 % (07/23 0821) Weight:  [238 lb (107.956 kg)] 238 lb (107.956 kg) (07/23 0600)  Intake/Output from previous day: 07/22 0701 - 07/23 0700 In: 2625.8 [P.O.:720; I.V.:1905.8] Out: 515 [Urine:500; Blood:15] Intake/Output this shift: Total I/O In: 480 [P.O.:480] Out: -    Recent Labs  06/18/15 0516 06/18/15 2214 06/19/15 0612 06/20/15 0556  HGB 7.7* 10.1* 9.5* 9.5*    Recent Labs  06/19/15 0612 06/20/15 0556  WBC 9.2 10.5  RBC 3.34* 3.29*  HCT 28.3* 28.0*  PLT 343 346    Recent Labs  06/19/15 0612 06/20/15 0556  NA 134* 136  K 3.8 4.1  CL 105 107  CO2 23 23  BUN 47* 43*  CREATININE 4.17* 4.01*  GLUCOSE 202* 141*  CALCIUM 7.9* 8.0*   No results for input(s): LABPT, INR in the last 72 hours.  Generally patient is alert and in no apparent distress Chest is clear to auscultation His heart exam regular rate and rhythm no murmur Extremities he has 1+ edema on the right and trace on the left.  Assessment/Plan: Renal failure: Chronic. His BUN and creatinine show slight improvement. Patient remained stage IV. Presently he denies any uremic sinus symptoms. Problem #2 diabetes Problem #3 anemia: Patient with normal iron saturation and ferritin. Hence this is anemia of chronic disease and his hemoglobin is stable. Problem #4 cellulitis of right foot. Status post debridement Patient is on Antibiotics. He is a febrile. His white blood cell count is improving. Problem #5 metabolic bone  disease: His calcium is in range. Problem #6 hypertension: His blood pressure is reasonably  controlled Problem #7 metabolic acidosis: Presently his CO2 is within acceptable range. Plan: 1] We'll continue his present management 2] patient has this moment doesn't require any dialysis. When he goes back he will be followed by his primary nephrologist Dr. Felipa Furnace    [facility] 06/20/2015, 9:32 AM

## 2015-06-21 LAB — CULTURE, BLOOD (ROUTINE X 2): CULTURE: NO GROWTH

## 2015-06-21 NOTE — Anesthesia Postprocedure Evaluation (Signed)
  Anesthesia Post-op Note Late entry  Patient: Maurice [name]  Procedure(s) Performed: Procedure(s): APPLICATION OF WOUND VAC (Right) IRRIGATION AND DEBRIDEMENT FOOT  (Right)  Patient Location: PACU  Anesthesia Type:MAC  Level of Consciousness: awake, alert , oriented and patient cooperative  Airway and Oxygen Therapy: Patient Spontanous Breathing  Post-op Pain: mild  Post-op Assessment: Post-op Vital signs reviewed, Patient's Cardiovascular Status Stable, Respiratory Function Stable, Patent Airway and No signs of Nausea or vomiting              Post-op Vital Signs: Reviewed and stable  Last Vitals:  Filed Vitals:   06/20/15 0821  BP: 161/87  Pulse: 79  Temp:   Resp: 18    Complications: No apparent anesthesia complications

## 2015-06-22 ENCOUNTER — Encounter (HOSPITAL_COMMUNITY): Payer: Self-pay | Admitting: Podiatry

## 2015-06-22 LAB — WOUND CULTURE

## 2015-06-22 LAB — CULTURE, BLOOD (ROUTINE X 2)
Culture: NO GROWTH
Culture: NO GROWTH

## 2015-06-22 LAB — GLUCOSE, CAPILLARY: GLUCOSE-CAPILLARY: 156 mg/dL — AB (ref 65–99)

## 2015-06-24 LAB — ANAEROBIC CULTURE

## 2015-06-29 ENCOUNTER — Other Ambulatory Visit (HOSPITAL_COMMUNITY): Payer: Medicare Other

## 2015-07-01 ENCOUNTER — Other Ambulatory Visit: Payer: Self-pay | Admitting: Podiatry

## 2015-07-01 NOTE — Patient Instructions (Addendum)
Maurice [name]  07/01/2015     @PREFPERIOPPHARMACY @   Your procedure is scheduled on  07/08/2015   Report to [facility] at  700  A.M.  Call this number if you have problems the morning of surgery:  [phone]   Remember:  Do not eat food or drink liquids after midnight.  Take these medicines the morning of surgery with A SIP OF WATER: xanax, flexaril, lisinopril, metoprolol, prilosec, oxycodone.   Do not wear jewelry, make-up or nail polish.  Do not wear lotions, powders, or perfumes.    Do not shave 48 hours prior to surgery.  Men may shave face and neck.  Do not bring valuables to the hospital.  [facility] is not responsible for any belongings or valuables.  Contacts, dentures or bridgework may not be worn into surgery.  Leave your suitcase in the car.  After surgery it may be brought to your room.  For patients admitted to the hospital, discharge time will be determined by your treatment team.  Patients discharged the day of surgery will not be allowed to drive home.   Name and phone number of your driver:   family Special instructions:  none  Please read over the following fact sheets that you were given. Pain Booklet, Coughing and Deep Breathing, Surgical Site Infection Prevention, Anesthesia Post-op Instructions and Care and Recovery After Surgery      Wound Debridement Wound debridement is a procedure used to remove dead tissue and contaminated substances from a wound. A wound must be clean to heal. It also must get a good supply of blood. Anything that is stopping this must be taken out of the wound. This could be dead tissue, scar tissue, fluid buildup, or debris from outside of the body. Wounds that are not cleaned by debridement heal slowly or not at all. They can become infected. Any infection in the tissue can also spread to nearby areas or to other parts of the body through the blood. Wound debridement can be done through a surgical procedure or  various other methods. Debridement is sometimes done to get a sample of tissue from the wound. The tissue can be checked under a microscope or sent to a lab for testing.  LET YOUR CAREGIVER KNOW ABOUT:   Any allergies you have.  All medicines you are taking, including vitamins, steroids, herbs, eyedrops, and over-the-counter medicines and creams.   Previous problems you or members of your family have had with the use of anesthetics.   Any blood disorders you have had.   Previous surgeries you have had.   Other health problems you have.  RISKS AND COMPLICATIONS  Generally, wound debridement is a safe procedure. However, as with any medical procedure, complications can occur. Possible complications include:  Bleeding that does not stop.   Infection.   Damage to nerves, blood vessels, or healthy tissue inside the wound.   Pain.   Lack of healing. BEFORE THE PROCEDURE   The caregiver will check the wound for signs of healing or infection. A measurement of the wound will be taken, including how deep it is. A metal tool (probe) may be used. Blood tests may be done to check for infection.   If you will be given medicine to make you sleep through the procedure (general anesthetic), do not eat or drink anything for at least 6 hours before the procedure. Ask your caregiver if it is okay to have a sip  of water with any needed medicine.   Make plans to have someone drive you home after the procedure. Also, make sure someone can stay with you for a few days.  PROCEDURE  The following methods may be used alone or in combination. Surgical debridement:  Small monitors may be placed on your body. They are used to check your heart, blood pressure, and oxygen level.   You may be given medicine through an intravenous (IV) access tube in your hand or arm.   You might be given medicine to help you relax (sedative).   You may be given medicine to numb the area around the wound  (local anesthetic). If the wound is deep or wide, you may be given general anesthetic to make you sleep through the procedure.   Once you are asleep or the wound area is numb, the wound may be washed with a sterile saltwater solution.   Scissors, surgical knives (scalpels), and surgical tweezers (forceps) will be used to remove dead or dying tissue. Any other material that should not be in the wound will also be taken out.   After the tissue and other material have been removed from the wound, the wound will be washed again.   A bandage (dressing) may be placed over the wound.  Mechanical debridement: Mechanical debridement may involve various techniques:  A dressing may be used to pull off dead tissue. A moist dressing is placed over the wound. It is left in place until it is dry. When the dressing is lifted off, this lifts away the dead tissue.   Whirlpool baths may be used to flush the wound with forceful streams of hot water.   The wound may be flushed with sterile solution.  Surgical instruments that use water under high pressure may be used to clean the wound. Chemical debridement:  A chemical medicine is put on the wound. The aim is to dissolve dead or dying tissue. Ointments may also be used. Autolytic debridement:  A special dressing is used to trap moisture inside the wound. The goal is for the wound to heal naturally under the dressing. Healing takes longer with this treatment. AFTER THE PROCEDURE   If a local anesthetic is used, you will be allowed to go home as soon as you are ready. If a general anesthetic is used, you will be taken to a recovery area until you are stable. Your blood pressure and pulse will be checked often. You may continue to get fluids through the IV tube for a while. Once you are stable, you may be able to go home, or you may need to stay in the hospital overnight.Your caregiver will decide when you can go home.   You may feel some pain. You  will likely be given medicine for pain.  Before you go home, make sure you know how to care for the wound. This includes knowing when the dressing should be changed and how to change it.   Set up a follow-up appointment before leaving. Document Released: 02/08/2010 Document Revised: 10/31/2012 Document Reviewed: 08/01/2012 [facility] Patient Information 2015 [place], [place]. This information is not intended to replace advice given to you by your health care provider. Make sure you discuss any questions you have with your health care provider. Wound Debridement Wound debridement is a procedure used to remove dead tissue and contaminated substances from a wound. A wound must be clean to heal. It also must get a good supply of blood. Anything that is stopping this  must be taken out of the wound. This could be dead tissue, scar tissue, fluid buildup, or debris from outside of the body. Wounds that are not cleaned by debridement heal slowly or not at all. They can become infected. Any infection in the tissue can also spread to nearby areas or to other parts of the body through the blood. Wound debridement can be done through a surgical procedure or various other methods. Debridement is sometimes done to get a sample of tissue from the wound. The tissue can be checked under a microscope or sent to a lab for testing.  LET YOUR CAREGIVER KNOW ABOUT:   Any allergies you have.  All medicines you are taking, including vitamins, steroids, herbs, eyedrops, and over-the-counter medicines and creams.   Previous problems you or members of your family have had with the use of anesthetics.   Any blood disorders you have had.   Previous surgeries you have had.   Other health problems you have.  RISKS AND COMPLICATIONS  Generally, wound debridement is a safe procedure. However, as with any medical procedure, complications can occur. Possible complications include:  Bleeding that does not stop.    Infection.   Damage to nerves, blood vessels, or healthy tissue inside the wound.   Pain.   Lack of healing. BEFORE THE PROCEDURE   The caregiver will check the wound for signs of healing or infection. A measurement of the wound will be taken, including how deep it is. A metal tool (probe) may be used. Blood tests may be done to check for infection.   If you will be given medicine to make you sleep through the procedure (general anesthetic), do not eat or drink anything for at least 6 hours before the procedure. Ask your caregiver if it is okay to have a sip of water with any needed medicine.   Make plans to have someone drive you home after the procedure. Also, make sure someone can stay with you for a few days.  PROCEDURE  The following methods may be used alone or in combination. Surgical debridement:  Small monitors may be placed on your body. They are used to check your heart, blood pressure, and oxygen level.   You may be given medicine through an intravenous (IV) access tube in your hand or arm.   You might be given medicine to help you relax (sedative).   You may be given medicine to numb the area around the wound (local anesthetic). If the wound is deep or wide, you may be given general anesthetic to make you sleep through the procedure.   Once you are asleep or the wound area is numb, the wound may be washed with a sterile saltwater solution.   Scissors, surgical knives (scalpels), and surgical tweezers (forceps) will be used to remove dead or dying tissue. Any other material that should not be in the wound will also be taken out.   After the tissue and other material have been removed from the wound, the wound will be washed again.   A bandage (dressing) may be placed over the wound.  Mechanical debridement: Mechanical debridement may involve various techniques:  A dressing may be used to pull off dead tissue. A moist dressing is placed over the  wound. It is left in place until it is dry. When the dressing is lifted off, this lifts away the dead tissue.   Whirlpool baths may be used to flush the wound with forceful streams of hot water.  The wound may be flushed with sterile solution.  Surgical instruments that use water under high pressure may be used to clean the wound. Chemical debridement:  A chemical medicine is put on the wound. The aim is to dissolve dead or dying tissue. Ointments may also be used. Autolytic debridement:  A special dressing is used to trap moisture inside the wound. The goal is for the wound to heal naturally under the dressing. Healing takes longer with this treatment. AFTER THE PROCEDURE   If a local anesthetic is used, you will be allowed to go home as soon as you are ready. If a general anesthetic is used, you will be taken to a recovery area until you are stable. Your blood pressure and pulse will be checked often. You may continue to get fluids through the IV tube for a while. Once you are stable, you may be able to go home, or you may need to stay in the hospital overnight.Your caregiver will decide when you can go home.   You may feel some pain. You will likely be given medicine for pain.  Before you go home, make sure you know how to care for the wound. This includes knowing when the dressing should be changed and how to change it.   Set up a follow-up appointment before leaving. Document Released: 02/08/2010 Document Revised: 10/31/2012 Document Reviewed: 08/01/2012 [facility] Patient Information 2015 [place], [place]. This information is not intended to replace advice given to you by your health care provider. Make sure you discuss any questions you have with your health care provider. PATIENT INSTRUCTIONS POST-ANESTHESIA  IMMEDIATELY FOLLOWING SURGERY:  Do not drive or operate machinery for the first twenty four hours after surgery.  Do not make any important decisions for twenty four  hours after surgery or while taking narcotic pain medications or sedatives.  If you develop intractable nausea and vomiting or a severe headache please notify your doctor immediately.  FOLLOW-UP:  Please make an appointment with your surgeon as instructed. You do not need to follow up with anesthesia unless specifically instructed to do so.  WOUND CARE INSTRUCTIONS (if applicable):  Keep a dry clean dressing on the anesthesia/puncture wound site if there is drainage.  Once the wound has quit draining you may leave it open to air.  Generally you should leave the bandage intact for twenty four hours unless there is drainage.  If the epidural site drains for more than 36-48 hours please call the anesthesia department.  QUESTIONS?:  Please feel free to call your physician or the hospital operator if you have any questions, and they will be happy to assist you.

## 2015-07-06 ENCOUNTER — Ambulatory Visit (HOSPITAL_COMMUNITY)
Admission: RE | Admit: 2015-07-06 | Discharge: 2015-07-06 | Disposition: A | Discharge status: Home / Self Care | Payer: Medicare Other | Source: Ambulatory Visit | Attending: Podiatry | Admitting: Podiatry

## 2015-07-06 ENCOUNTER — Other Ambulatory Visit (HOSPITAL_COMMUNITY): Payer: Medicare Other

## 2015-07-06 ENCOUNTER — Encounter (HOSPITAL_COMMUNITY): Payer: Self-pay

## 2015-07-06 ENCOUNTER — Other Ambulatory Visit: Payer: Self-pay | Admitting: Podiatry

## 2015-07-06 ENCOUNTER — Encounter (HOSPITAL_COMMUNITY)
Admission: RE | Admit: 2015-07-06 | Discharge: 2015-07-06 | Disposition: A | Discharge status: Home / Self Care | Payer: Medicare Other | Source: Ambulatory Visit | Attending: Podiatry | Admitting: Podiatry

## 2015-07-06 DIAGNOSIS — Z01818 Encounter for other preprocedural examination: Secondary | ICD-10-CM

## 2015-07-06 DIAGNOSIS — L97519 Non-pressure chronic ulcer of other part of right foot with unspecified severity: Secondary | ICD-10-CM

## 2015-07-06 DIAGNOSIS — Z01812 Encounter for preprocedural laboratory examination: Secondary | ICD-10-CM

## 2015-07-06 DIAGNOSIS — M869 Osteomyelitis, unspecified: Secondary | ICD-10-CM

## 2015-07-06 DIAGNOSIS — L97512 Non-pressure chronic ulcer of other part of right foot with fat layer exposed: Secondary | ICD-10-CM

## 2015-07-06 LAB — C-REACTIVE PROTEIN: CRP: 26.7 mg/dL — ABNORMAL HIGH (ref <1.0)

## 2015-07-06 LAB — COMPREHENSIVE METABOLIC PANEL
ALT: 15 U/L — AB (ref 17–63)
ANION GAP: 11 (ref 5–15)
AST: 12 U/L — ABNORMAL LOW (ref 15–41)
Albumin: 2 g/dL — ABNORMAL LOW (ref 3.5–5.0)
Alkaline Phosphatase: 116 U/L (ref 38–126)
BUN: 67 mg/dL — AB (ref 6–20)
CO2: 23 mmol/L (ref 22–32)
CREATININE: 6.29 mg/dL — AB (ref 0.61–1.24)
Calcium: 7.9 mg/dL — ABNORMAL LOW (ref 8.9–10.3)
Chloride: 95 mmol/L — ABNORMAL LOW (ref 101–111)
GFR calc Af Amer: 12 mL/min — ABNORMAL LOW (ref >60)
GFR calc non Af Amer: 10 mL/min — ABNORMAL LOW (ref >60)
Glucose, Bld: 287 mg/dL — ABNORMAL HIGH (ref 65–99)
Potassium: 4.9 mmol/L (ref 3.5–5.1)
SODIUM: 129 mmol/L — AB (ref 135–145)
TOTAL PROTEIN: 6.9 g/dL (ref 6.5–8.1)
Total Bilirubin: 0.2 mg/dL — ABNORMAL LOW (ref 0.3–1.2)

## 2015-07-06 LAB — CBC WITH DIFFERENTIAL/PLATELET
Basophils Absolute: 0.1 10*3/uL (ref 0.0–0.1)
Basophils Relative: 1 % (ref 0–1)
EOS PCT: 6 % — AB (ref 0–5)
Eosinophils Absolute: 0.7 10*3/uL (ref 0.0–0.7)
HEMATOCRIT: 24.2 % — AB (ref 39.0–52.0)
HEMOGLOBIN: 8.1 g/dL — AB (ref 13.0–17.0)
Lymphocytes Relative: 20 % (ref 12–46)
Lymphs Abs: 2.4 10*3/uL (ref 0.7–4.0)
MCH: 28 pg (ref 26.0–34.0)
MCHC: 33.5 g/dL (ref 30.0–36.0)
MCV: 83.7 fL (ref 78.0–100.0)
Monocytes Absolute: 1.2 10*3/uL — ABNORMAL HIGH (ref 0.1–1.0)
Monocytes Relative: 10 % (ref 3–12)
NEUTROS PCT: 63 % (ref 43–77)
Neutro Abs: 7.6 10*3/uL (ref 1.7–7.7)
PLATELETS: 347 10*3/uL (ref 150–400)
RBC: 2.89 MIL/uL — ABNORMAL LOW (ref 4.22–5.81)
RDW: 13.6 % (ref 11.5–15.5)
WBC: 11.9 10*3/uL — AB (ref 4.0–10.5)

## 2015-07-06 LAB — SEDIMENTATION RATE: Sed Rate: >140 mm/hr — ABNORMAL HIGH (ref 0–16)

## 2015-07-07 MED ORDER — CEFAZOLIN SODIUM-DEXTROSE 2-3 GM-% IV SOLR: 2.0000 g | Freq: Once | INTRAVENOUS | Status: DC

## 2015-07-07 NOTE — Pre-Procedure Instructions (Signed)
Dr Jayme Cloud aware of labs. No further orders given.

## 2015-07-08 ENCOUNTER — Inpatient Hospital Stay (HOSPITAL_COMMUNITY): Payer: Medicare Other

## 2015-07-08 ENCOUNTER — Encounter (HOSPITAL_COMMUNITY): Payer: Self-pay | Admitting: *Deleted

## 2015-07-08 ENCOUNTER — Inpatient Hospital Stay (HOSPITAL_COMMUNITY)
Admission: AD | Admit: 2015-07-08 | Discharge: 2015-07-11 | DRG: 853 | Disposition: A | Discharge status: Home Health | Payer: Medicare Other | Source: Ambulatory Visit | Attending: Internal Medicine | Admitting: Internal Medicine

## 2015-07-08 DIAGNOSIS — K219 Gastro-esophageal reflux disease without esophagitis: Secondary | ICD-10-CM | POA: Diagnosis present

## 2015-07-08 DIAGNOSIS — Z9889 Other specified postprocedural states: Secondary | ICD-10-CM

## 2015-07-08 DIAGNOSIS — Z833 Family history of diabetes mellitus: Secondary | ICD-10-CM

## 2015-07-08 DIAGNOSIS — I10 Essential (primary) hypertension: Secondary | ICD-10-CM | POA: Diagnosis present

## 2015-07-08 DIAGNOSIS — Z91040 Latex allergy status: Secondary | ICD-10-CM | POA: Diagnosis not present

## 2015-07-08 DIAGNOSIS — R509 Fever, unspecified: Secondary | ICD-10-CM | POA: Diagnosis present

## 2015-07-08 DIAGNOSIS — N184 Chronic kidney disease, stage 4 (severe): Secondary | ICD-10-CM | POA: Diagnosis present

## 2015-07-08 DIAGNOSIS — E1122 Type 2 diabetes mellitus with diabetic chronic kidney disease: Secondary | ICD-10-CM | POA: Diagnosis present

## 2015-07-08 DIAGNOSIS — G4730 Sleep apnea, unspecified: Secondary | ICD-10-CM | POA: Diagnosis present

## 2015-07-08 DIAGNOSIS — E11621 Type 2 diabetes mellitus with foot ulcer: Secondary | ICD-10-CM | POA: Diagnosis present

## 2015-07-08 DIAGNOSIS — I129 Hypertensive chronic kidney disease with stage 1 through stage 4 chronic kidney disease, or unspecified chronic kidney disease: Secondary | ICD-10-CM | POA: Diagnosis present

## 2015-07-08 DIAGNOSIS — N170 Acute kidney failure with tubular necrosis: Secondary | ICD-10-CM | POA: Diagnosis present

## 2015-07-08 DIAGNOSIS — M869 Osteomyelitis, unspecified: Secondary | ICD-10-CM | POA: Diagnosis present

## 2015-07-08 DIAGNOSIS — E872 Acidosis: Secondary | ICD-10-CM | POA: Diagnosis present

## 2015-07-08 DIAGNOSIS — E1142 Type 2 diabetes mellitus with diabetic polyneuropathy: Secondary | ICD-10-CM | POA: Diagnosis present

## 2015-07-08 DIAGNOSIS — N186 End stage renal disease: Secondary | ICD-10-CM

## 2015-07-08 DIAGNOSIS — L97519 Non-pressure chronic ulcer of other part of right foot with unspecified severity: Secondary | ICD-10-CM | POA: Diagnosis present

## 2015-07-08 DIAGNOSIS — E1165 Type 2 diabetes mellitus with hyperglycemia: Secondary | ICD-10-CM | POA: Diagnosis present

## 2015-07-08 DIAGNOSIS — E11319 Type 2 diabetes mellitus with unspecified diabetic retinopathy without macular edema: Secondary | ICD-10-CM | POA: Diagnosis present

## 2015-07-08 DIAGNOSIS — F418 Other specified anxiety disorders: Secondary | ICD-10-CM | POA: Diagnosis present

## 2015-07-08 DIAGNOSIS — E871 Hypo-osmolality and hyponatremia: Secondary | ICD-10-CM | POA: Diagnosis present

## 2015-07-08 DIAGNOSIS — Z794 Long term (current) use of insulin: Secondary | ICD-10-CM

## 2015-07-08 DIAGNOSIS — E118 Type 2 diabetes mellitus with unspecified complications: Secondary | ICD-10-CM | POA: Diagnosis not present

## 2015-07-08 DIAGNOSIS — Z801 Family history of malignant neoplasm of trachea, bronchus and lung: Secondary | ICD-10-CM | POA: Diagnosis not present

## 2015-07-08 DIAGNOSIS — IMO0002 Reserved for concepts with insufficient information to code with codable children: Secondary | ICD-10-CM | POA: Diagnosis present

## 2015-07-08 DIAGNOSIS — L089 Local infection of the skin and subcutaneous tissue, unspecified: Secondary | ICD-10-CM | POA: Diagnosis present

## 2015-07-08 DIAGNOSIS — N179 Acute kidney failure, unspecified: Secondary | ICD-10-CM | POA: Diagnosis not present

## 2015-07-08 DIAGNOSIS — E11628 Type 2 diabetes mellitus with other skin complications: Secondary | ICD-10-CM | POA: Diagnosis present

## 2015-07-08 DIAGNOSIS — D638 Anemia in other chronic diseases classified elsewhere: Secondary | ICD-10-CM | POA: Diagnosis present

## 2015-07-08 DIAGNOSIS — Z89429 Acquired absence of other toe(s), unspecified side: Secondary | ICD-10-CM | POA: Diagnosis not present

## 2015-07-08 DIAGNOSIS — Z87891 Personal history of nicotine dependence: Secondary | ICD-10-CM | POA: Diagnosis not present

## 2015-07-08 DIAGNOSIS — A419 Sepsis, unspecified organism: Principal | ICD-10-CM | POA: Diagnosis present

## 2015-07-08 DIAGNOSIS — L97512 Non-pressure chronic ulcer of other part of right foot with fat layer exposed: Secondary | ICD-10-CM

## 2015-07-08 HISTORY — DX: Sepsis, unspecified organism: A41.9

## 2015-07-08 LAB — CBC WITH DIFFERENTIAL/PLATELET
BASOS ABS: 0.1 10*3/uL (ref 0.0–0.1)
BASOS PCT: 0 % (ref 0–1)
EOS PCT: 1 % (ref 0–5)
Eosinophils Absolute: 0.1 10*3/uL (ref 0.0–0.7)
HCT: 19.9 % — ABNORMAL LOW (ref 39.0–52.0)
Hemoglobin: 6.7 g/dL — CL (ref 13.0–17.0)
Lymphocytes Relative: 12 % (ref 12–46)
Lymphs Abs: 1.7 10*3/uL (ref 0.7–4.0)
MCH: 28.2 pg (ref 26.0–34.0)
MCHC: 33.7 g/dL (ref 30.0–36.0)
MCV: 83.6 fL (ref 78.0–100.0)
MONO ABS: 1.3 10*3/uL — AB (ref 0.1–1.0)
Monocytes Relative: 9 % (ref 3–12)
Neutro Abs: 10.8 10*3/uL — ABNORMAL HIGH (ref 1.7–7.7)
Neutrophils Relative %: 78 % — ABNORMAL HIGH (ref 43–77)
Platelets: 365 10*3/uL (ref 150–400)
RBC: 2.38 MIL/uL — AB (ref 4.22–5.81)
RDW: 13.8 % (ref 11.5–15.5)
WBC: 14 10*3/uL — ABNORMAL HIGH (ref 4.0–10.5)

## 2015-07-08 LAB — COMPREHENSIVE METABOLIC PANEL
ALBUMIN: 1.7 g/dL — AB (ref 3.5–5.0)
ALK PHOS: 99 U/L (ref 38–126)
ALT: 10 U/L — AB (ref 17–63)
AST: 13 U/L — ABNORMAL LOW (ref 15–41)
Anion gap: 12 (ref 5–15)
BILIRUBIN TOTAL: 0.3 mg/dL (ref 0.3–1.2)
BUN: 59 mg/dL — ABNORMAL HIGH (ref 6–20)
CO2: 20 mmol/L — ABNORMAL LOW (ref 22–32)
Calcium: 7.6 mg/dL — ABNORMAL LOW (ref 8.9–10.3)
Chloride: 97 mmol/L — ABNORMAL LOW (ref 101–111)
Creatinine, Ser: 5.33 mg/dL — ABNORMAL HIGH (ref 0.61–1.24)
GFR calc Af Amer: 14 mL/min — ABNORMAL LOW (ref >60)
GFR calc non Af Amer: 12 mL/min — ABNORMAL LOW (ref >60)
Glucose, Bld: 335 mg/dL — ABNORMAL HIGH (ref 65–99)
POTASSIUM: 4.1 mmol/L (ref 3.5–5.1)
SODIUM: 129 mmol/L — AB (ref 135–145)
TOTAL PROTEIN: 7 g/dL (ref 6.5–8.1)

## 2015-07-08 LAB — RETICULOCYTES
RBC.: 2.4 MIL/uL — ABNORMAL LOW (ref 4.22–5.81)
Retic Count, Absolute: 16.8 10*3/uL — ABNORMAL LOW (ref 19.0–186.0)
Retic Ct Pct: 0.7 % (ref 0.4–3.1)

## 2015-07-08 LAB — LACTIC ACID, PLASMA
Lactic Acid, Venous: 1 mmol/L (ref 0.5–2.0)
Lactic Acid, Venous: 0.9 mmol/L (ref 0.5–2.0)

## 2015-07-08 LAB — GLUCOSE, CAPILLARY
Glucose-Capillary: 358 mg/dL — ABNORMAL HIGH (ref 65–99)
Glucose-Capillary: 284 mg/dL — ABNORMAL HIGH (ref 65–99)
Glucose-Capillary: 169 mg/dL — ABNORMAL HIGH (ref 65–99)
Glucose-Capillary: 140 mg/dL — ABNORMAL HIGH (ref 65–99)

## 2015-07-08 LAB — FERRITIN
FERRITIN: 324 ng/mL (ref 24–336)
Ferritin: 298 ng/mL (ref 24–336)

## 2015-07-08 LAB — URINALYSIS, ROUTINE W REFLEX MICROSCOPIC
BILIRUBIN URINE: NEGATIVE
Glucose, UA: >1000 mg/dL — AB
KETONES UR: NEGATIVE mg/dL
Leukocytes, UA: NEGATIVE
NITRITE: NEGATIVE
Specific Gravity, Urine: 1.025 (ref 1.005–1.030)
Urobilinogen, UA: 0.2 mg/dL (ref 0.0–1.0)
pH: 7 (ref 5.0–8.0)

## 2015-07-08 LAB — MAGNESIUM: Magnesium: 1.7 mg/dL (ref 1.7–2.4)

## 2015-07-08 LAB — PREPARE RBC (CROSSMATCH)

## 2015-07-08 LAB — IRON AND TIBC
Iron: 13 ug/dL — ABNORMAL LOW (ref 45–182)
Iron: 19 ug/dL — ABNORMAL LOW (ref 45–182)
SATURATION RATIOS: 10 % — AB (ref 17.9–39.5)
Saturation Ratios: 14 % — ABNORMAL LOW (ref 17.9–39.5)
TIBC: 132 ug/dL — ABNORMAL LOW (ref 250–450)
TIBC: 139 ug/dL — AB (ref 250–450)
UIBC: 119 ug/dL
UIBC: 120 ug/dL

## 2015-07-08 LAB — URINE MICROSCOPIC-ADD ON

## 2015-07-08 LAB — VITAMIN B12
VITAMIN B 12: 452 pg/mL (ref 180–914)
Vitamin B-12: 440 pg/mL (ref 180–914)

## 2015-07-08 LAB — PROTIME-INR
INR: 1.24 (ref 0.00–1.49)
Prothrombin Time: 15.7 seconds — ABNORMAL HIGH (ref 11.6–15.2)

## 2015-07-08 LAB — APTT: aPTT: 39 seconds — ABNORMAL HIGH (ref 24–37)

## 2015-07-08 LAB — PHOSPHORUS: Phosphorus: 5.6 mg/dL — ABNORMAL HIGH (ref 2.5–4.6)

## 2015-07-08 LAB — FOLATE: Folate: 17.4 ng/mL (ref >5.9)

## 2015-07-08 LAB — PROCALCITONIN: Procalcitonin: 1.46 ng/mL

## 2015-07-08 MED ORDER — SODIUM CHLORIDE 0.9 % IV SOLN
300.0000 mg | Freq: Two times a day (BID) | INTRAVENOUS | Status: DC
  Administered 2015-07-08 – 2015-07-11 (×7): 300 mg via INTRAVENOUS
  Filled 2015-07-08 (×12): qty 300

## 2015-07-08 MED ORDER — LACTATED RINGERS IV SOLN
INTRAVENOUS | Status: DC
  Administered 2015-07-08: 08:00:00 via INTRAVENOUS

## 2015-07-08 MED ORDER — SODIUM CHLORIDE 0.9 % IV SOLN
INTRAVENOUS | Status: DC
  Administered 2015-07-08 – 2015-07-11 (×3): via INTRAVENOUS

## 2015-07-08 MED ORDER — INSULIN ASPART 100 UNIT/ML ~~LOC~~ SOLN
0.0000 [IU] | Freq: Every day | SUBCUTANEOUS | Status: DC
  Administered 2015-07-10: 2 [IU] via SUBCUTANEOUS

## 2015-07-08 MED ORDER — SODIUM CHLORIDE 0.9 % IV BOLUS (SEPSIS)
250.0000 mL | Freq: Once | INTRAVENOUS | Status: AC
  Administered 2015-07-08: 250 mL via INTRAVENOUS

## 2015-07-08 MED ORDER — PANTOPRAZOLE SODIUM 40 MG PO TBEC
80.0000 mg | DELAYED_RELEASE_TABLET | Freq: Every day | ORAL | Status: DC
  Administered 2015-07-08 – 2015-07-11 (×3): 80 mg via ORAL
  Filled 2015-07-08 (×3): qty 2

## 2015-07-08 MED ORDER — SODIUM CHLORIDE 0.9 % IV SOLN
Freq: Once | INTRAVENOUS | Status: AC
  Administered 2015-07-08: 18:00:00 via INTRAVENOUS

## 2015-07-08 MED ORDER — SODIUM CHLORIDE 0.9 % IJ SOLN
3.0000 mL | Freq: Two times a day (BID) | INTRAMUSCULAR | Status: DC
  Administered 2015-07-08 – 2015-07-10 (×4): 3 mL via INTRAVENOUS

## 2015-07-08 MED ORDER — FLUTICASONE PROPIONATE 50 MCG/ACT NA SUSP
1.0000 | Freq: Every day | NASAL | Status: DC
  Administered 2015-07-08 – 2015-07-11 (×3): 1 via NASAL
  Filled 2015-07-08: qty 16

## 2015-07-08 MED ORDER — INSULIN ASPART 100 UNIT/ML ~~LOC~~ SOLN
0.0000 [IU] | Freq: Three times a day (TID) | SUBCUTANEOUS | Status: DC
Start: 2015-07-08 — End: 2015-07-11
  Administered 2015-07-08: 8 [IU] via SUBCUTANEOUS
  Administered 2015-07-08 – 2015-07-09 (×2): 3 [IU] via SUBCUTANEOUS
  Administered 2015-07-09: 5 [IU] via SUBCUTANEOUS
  Administered 2015-07-10 – 2015-07-11 (×3): 3 [IU] via SUBCUTANEOUS
  Administered 2015-07-11: 2 [IU] via SUBCUTANEOUS

## 2015-07-08 MED ORDER — HEPARIN SODIUM (PORCINE) 5000 UNIT/ML IJ SOLN
5000.0000 [IU] | Freq: Three times a day (TID) | INTRAMUSCULAR | Status: DC
  Administered 2015-07-08 – 2015-07-09 (×4): 5000 [IU] via SUBCUTANEOUS
  Filled 2015-07-08 (×4): qty 1

## 2015-07-08 MED ORDER — OXYCODONE HCL 5 MG PO TABS
30.0000 mg | ORAL_TABLET | Freq: Four times a day (QID) | ORAL | Status: DC | PRN
  Administered 2015-07-08 – 2015-07-11 (×6): 30 mg via ORAL
  Filled 2015-07-08 (×6): qty 6

## 2015-07-08 MED ORDER — INSULIN GLARGINE 100 UNIT/ML ~~LOC~~ SOLN
15.0000 [IU] | Freq: Every day | SUBCUTANEOUS | Status: DC
  Administered 2015-07-08: 15 [IU] via SUBCUTANEOUS
  Filled 2015-07-08 (×5): qty 0.15

## 2015-07-08 MED ORDER — VILAZODONE HCL 20 MG PO TABS: 20.0000 mg | ORAL_TABLET | Freq: Every day | ORAL | Status: DC

## 2015-07-08 MED ORDER — SODIUM CHLORIDE 0.9 % IV SOLN
Freq: Once | INTRAVENOUS | Status: AC
  Administered 2015-07-08: via INTRAVENOUS

## 2015-07-08 MED ORDER — CHLORHEXIDINE GLUCONATE 4 % EX LIQD
Freq: Once | CUTANEOUS | Status: DC
Start: 2015-07-08 — End: 2015-07-11
  Filled 2015-07-08: qty 15

## 2015-07-08 MED ORDER — CYCLOBENZAPRINE HCL 10 MG PO TABS
10.0000 mg | ORAL_TABLET | Freq: Two times a day (BID) | ORAL | Status: DC
  Administered 2015-07-08 – 2015-07-11 (×5): 10 mg via ORAL
  Filled 2015-07-08 (×6): qty 1

## 2015-07-08 MED ORDER — ALPRAZOLAM 0.5 MG PO TABS
0.5000 mg | ORAL_TABLET | Freq: Three times a day (TID) | ORAL | Status: DC
  Administered 2015-07-08 – 2015-07-11 (×8): 0.5 mg via ORAL
  Filled 2015-07-08 (×8): qty 1

## 2015-07-08 MED ORDER — SODIUM BICARBONATE 650 MG PO TABS
650.0000 mg | ORAL_TABLET | Freq: Three times a day (TID) | ORAL | Status: DC
  Administered 2015-07-08 – 2015-07-11 (×8): 650 mg via ORAL
  Filled 2015-07-08 (×8): qty 1

## 2015-07-08 NOTE — H&P (Signed)
HISTORY AND PHYSICAL INTERVAL NOTE:  07/08/2015  8:31 AM  Maurice [name]  has presented today for surgery, with the diagnosis of surgical wound, diabetes mellitus with pain.  I have reviewed the patients' chart and labs.    Patient Vitals for the past 24 hrs:  BP Temp Temp src Pulse Resp SpO2 Height Weight  07/08/15 0735 131/68 mmHg (!) 102.7 F (39.3 C) Oral (!) 108 17 97 %  (1.905 m) 238 lb (107.956 kg)    Given his temperature and elevated creatinine, I do not recommend proceeding with today's planned procedure.  I will contact the hospitalist service for admission.  Dallas Schimke, DPM

## 2015-07-08 NOTE — Progress Notes (Signed)
Dr. [name] call at [phone] and ordered to remove wound vac from pt's right foot and leave off until he comes by in the morning and reapplies the wound vac.  He ordered to paint the perimeter of the wound with betadine and to apply a wet-to-dry drsg to actual wound.

## 2015-07-08 NOTE — Progress Notes (Signed)
Surgery cancelled per Dr. [name] due to patient condition. Patient admitted to be further evaluated.

## 2015-07-08 NOTE — Consult Note (Signed)
Podiatry Consult Note  Reason for Consultation:  Osteomyelitis of the right foot  History of Present Illness: Maurice [name] is a 39 y.o. male who was scheduled for debridement of the right foot with application of Wound VAC.  His preoperative labs revealed a Cr > 6.  Upon arrival to the preoperative area, he was found to be febrile with glucose > 300.  He stated that he had a fever last night which improved with Tylenol.  He is under the care of Dr. Felipa Furnace, nephrologist, in [place].  I personally notified Dr. Felipa Furnace of the lab results.   Past Medical History  Diagnosis Date  . PVC (premature ventricular contraction)   . PAC (premature atrial contraction)   . Diabetes mellitus without complication   . Anxiety   . Depression   . Blood clot in vein     pt sts" it may have been in artery and not vein" of right leg  . GERD (gastroesophageal reflux disease)   . H/O hiatal hernia   . Barrett's esophagus   . Arthritis   . Neuropathy   . Herniated disc, cervical     c5 and c6 and c6 and c7  . Hypertension   . Kidney failure     stage 4 just dx 4 months ago.Sees Dr Elby Beck, nephrologist in [place] [phone].  .  . 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 Diabetic retinopathy of both eyes, associated with type 2 diabetes mellitus   . Osteomyelitis of right foot 06/17/2015    S/p debridement, Dr. [name]  . Diabetic foot infection 06/16/2015    S/p debridement and wound VAC  . Sepsis    Scheduled Meds: . sodium chloride   Intravenous Once  . ALPRAZolam  0.5 mg Oral TID  . ceFTAROline (TEFLARO) IV  300 mg Intravenous Q12H  . cyclobenzaprine  10 mg Oral BID  . fluticasone  1 spray Each Nare Daily  . heparin  5,000 Units Subcutaneous 3 times per day  . insulin aspart  0-15 Units Subcutaneous TID WC  . insulin aspart  0-5 Units Subcutaneous QHS  . insulin glargine  15 Units Subcutaneous QHS  . pantoprazole  80 mg Oral Daily  . sodium chloride  3 mL  Intravenous Q12H  . Vilazodone HCl  20 mg Oral Daily   Continuous Infusions: . sodium chloride 75 mL/hr at 07/08/15 1105   PRN Meds:.oxycodone  Allergies  Allergen Reactions  . Daptomycin     Hypotension   . Bactrim [Sulfamethoxazole-Trimethoprim] Other (See Comments)    Cannot take due to kidney issue  . Dapsone Nausea And Vomiting  . Latex Other (See Comments)    Blisters.   . Penicillins Hives  . Vancomycin Itching   Past Surgical History  Procedure Laterality Date  . Wrist arthroscopy      left with no break reapir of tendond and ligamants  . Toe amputation      5th digit right foot-[place]  . Debridement leg      lower leg-left  . Metatarsal head excision Right 02/14/2013    Procedure: 4TH METATARSAL HEAD RESECTION RIGHT FOOT;  Surgeon: Dallas Schimke, DPM;  Location: [facility];  Service: Orthopedics;  Laterality: Right;  latex allergy  . Vitrectomy Right   . Application of wound vac Right 06/19/2015    Procedure: APPLICATION OF WOUND VAC;  Surgeon: [name], DPM;  Location: [facility];  Service: Podiatry;  Laterality: Right;  . Irrigation and debridement foot Right 06/19/2015    Procedure: IRRIGATION AND DEBRIDEMENT FOOT ;  Surgeon: [name], DPM;  Location: [facility];  Service: Podiatry;  Laterality: Right;   Family History  Problem Relation Age of Onset  . Cancer Mother     liver lung  . Cancer Father     liver  . Diabetes Mother   . Diabetes Father    Social History:  reports that he quit smoking about 3 weeks ago. His smoking use included Cigarettes. He has a 5 pack-year smoking history. His smokeless tobacco use includes Chew. He reports that he drinks alcohol. He reports that he does not use illicit drugs.  Review of Systems: Significant for nausea, fever and chills.  Significant for a chronic ulceration / surgical wound of the right foot.  He denies chest pain or shortness of breath.  He denies any periods of emesis.    Physical  Examination: Vital signs in last 24 hours:   Temp:  [98.6 F (37 C)-102.7 F (39.3 C)] 98.6 F (37 C) (08/10 1034) Pulse Rate:  [92-108] 92 (08/10 1034) Resp:  [17-18] 18 (08/10 1034) BP: (112-131)/(68) 112/68 mmHg (08/10 1034) SpO2:  [97 %] 97 % (08/10 1034) Weight:  [238 lb (107.956 kg)] 238 lb (107.956 kg) (08/10 0735)  DRESSING:  [employer] Wound VAC at 125 mmHg of continuous pressure. INTEGUMENT:  There is a full thickness wound along the plantar lateral aspect of the right foot.  The distal and medial aspects of the wound is granular.  The proximal and lateral aspects of the wound is fibrotic with exposed fifth metatarsal.  The periwound tissue is macerated.  There is malodor.  There is hyperkeratotic lesion along the plantar aspect of the first metatarsal head of th eleft foot. VASCULAR:  Pedal pulses are palpable bilaterally.  There is edema of the right foot. NEUROLOGIC:  Protective sensation is diminished bilaterally.  Lab/Test Results:   Recent Labs  07/06/15 1325 07/08/15 1057  WBC 11.9* 14.0*  HGB 8.1* 6.7*  HCT 24.2* 19.9*  PLT 347 365  NA 129* 129*  K 4.9 4.1  CL 95* 97*  CO2 23 20*  BUN 67* 59*  CREATININE 6.29* 5.33*  GLUCOSE 287* 335*  CALCIUM 7.9* 7.6*    No results found for this or any previous visit (from the past 240 hour(s)).   Dg Chest Port 1 View  07/08/2015   CLINICAL DATA:  Sepsis.  EXAM: PORTABLE CHEST - 1 VIEW  COMPARISON:  None.  FINDINGS: Both lungs are clear. Heart and mediastinum are within normal limits. The trachea is midline. Negative for a pneumothorax. No acute bone abnormality.  IMPRESSION: No acute chest abnormality.   Electronically Signed   By: Richarda Overlie M.D.   On: 07/08/2015 11:23   Dg Foot Complete Right  07/06/2015   CLINICAL DATA:  Nonhealing ulcer of the right foot.  EXAM: RIGHT FOOT COMPLETE - 3+ VIEW  COMPARISON:  06/19/2015 and MRI dated 06/16/2015  FINDINGS: The patient has developed new fragmentation of remnant of the base of the  fifth metatarsal. There is also new destruction of the lateral cortex of the base of the stump of the fourth metatarsal.  Chronic changes in the great toe and first metatarsal phalangeal joint are noted. Previous amputation of the distal fourth metatarsal and of the majority of fifth metatarsal and fifth toe.  IMPRESSION: Osteomyelitis of the remnant of the base of the fifth metatarsal and of  the lateral aspect of the base of the fourth metatarsal.   Electronically Signed   By: Francene Boyers M.D.   On: 07/06/2015 14:57    Assessment: 1.  Osteomyelitis of the residual fifth metatarsal, right foot. 2.  Concern for osteomyelitis of the fourth metatarsal, right foot. 3.  Surgical wound of the right foot following debridement of ulcer (skin, subcutaneous tissue, muscle and bone), right foot for treatment of osteomyelitis of the fifth metatarsal, right foot. 4.  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 5.  Renal disease.  Plan: The podiatric pathology and treatment options were reviewed with the patient.  The patient will need debridment of the right foot.  The residual portion of the fifth metatarsal will be removed.  The fourth metatarsal will be evaluated.  He may require debridement of the fourth metatarsal.  Surgery is tenatively planned for Friday morning if cleared by admitting service.  At some point, he may require a partial amputation of the right foot in hopes of preventing the need for a below knee amputation.  His healing potential is guarded given his chronic anemia, low albumin, diabetes mellitus and renal disease.  I reapplied the Wound VAC at 125 mmHg of continuous pressure.    Angelia Hazell IVAN 07/08/2015, 12:35 PM

## 2015-07-08 NOTE — H&P (Signed)
[facility] History and Physical  Maurice [name] AOZ:[id number] DOB: [birth year] DOA: 07/08/2015  Referring physician: [name] PCP: PROVIDER NOT IN SYSTEM   Chief Complaint: fever  HPI: Maurice [name] is a 39 y.o. male with a past medical history significant for chronic kidney disease stage IV, hypertension, anemia, osteomyelitis, recent bacteremia and chronic ongoing right foot infection status post resection of fourth and fifth metatarsals status post procedure 3 weeks ago I podiatry is admitted from perioperative area with sepsis. He was scheduled to have further debridement and was found to be septic.  Patient reports he has been on clindamycin and Cipro for the last 3 weeks since his last hospitalization. Today he was scheduled for more extensive debridement seizure was canceled due to his fever. He reports having intermittent low-grade fevers over the last several days. He would take Tylenol and they would resolve. Associated symptoms with that included intermittent nausea no vomiting 3 episodes of diarrhea. He denies abdominal pain headache visual disturbances syncope or near-syncope. He denies dysuria hematuria frequency or urgency. He denies chest pain palpitation shortness of breath cough. Upon presentation to the perioperative area he had a fever and worsening creatinine therefore procedure was canceled. Lab work reveals leukocytosis of 14.0 hemoglobin 6.7 sodium 129 chloride 97 CO2 20 creatinine 5.33. Actiq acid within the limits of normal as is pro-calcitonin level. Chest x-ray was no acute abnormality At the time of my exam he is afebrile hemodynamically stable and not hypoxic   Review of Systems:   10 point review of systems complete and all systems are negative except as indicated in the history of present illness  Past Medical History  Diagnosis Date  . PVC (premature ventricular contraction)   . PAC (premature atrial contraction)   . Diabetes mellitus without  complication   . Anxiety   . Depression   . Blood clot in vein     pt sts" it may have been in artery and not vein" of right leg  . GERD (gastroesophageal reflux disease)   . H/O hiatal hernia   . Barrett's esophagus   . Arthritis   . Neuropathy   . Herniated disc, cervical     c5 and c6 and c6 and c7  . Hypertension   . Kidney failure     stage 4 just dx 4 months ago.Sees Dr Elby Beck, nephrologist in [place] [phone].  .  . 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 Diabetic retinopathy of both eyes, associated with type 2 diabetes mellitus   . Osteomyelitis of right foot 06/17/2015    S/p debridement, Dr. [name]  . Diabetic foot infection 06/16/2015    S/p debridement and wound VAC  . Sepsis    Past Surgical History  Procedure Laterality Date  . Wrist arthroscopy      left with no break reapir of tendond and ligamants  . Toe amputation      5th digit right foot-[place]  . Debridement leg      lower leg-left  . Metatarsal head excision Right 02/14/2013    Procedure: 4TH METATARSAL HEAD RESECTION RIGHT FOOT;  Surgeon: Dallas Schimke, DPM;  Location: [facility];  Service: Orthopedics;  Laterality: Right;  latex allergy  . Vitrectomy Right   . Application of wound vac Right 06/19/2015    Procedure: APPLICATION OF WOUND VAC;  Surgeon: [name], DPM;  Location: [facility];  Service: Podiatry;  Laterality: Right;  . Irrigation  and debridement foot Right 06/19/2015    Procedure: IRRIGATION AND DEBRIDEMENT FOOT ;  Surgeon: [name], DPM;  Location: [facility];  Service: Podiatry;  Laterality: Right;   Social History:  reports that he quit smoking about 3 weeks ago. His smoking use included Cigarettes. He has a 5 pack-year smoking history. His smokeless tobacco use includes Chew. He reports that he drinks alcohol. He reports that he does not use illicit drugs. He lives at home with his wife he is independent with ADLs Allergies    Allergen Reactions  . Daptomycin     Hypotension   . Bactrim [Sulfamethoxazole-Trimethoprim] Other (See Comments)    Cannot take due to kidney issue  . Dapsone Nausea And Vomiting  . Latex Other (See Comments)    Blisters.   . Penicillins Hives  . Vancomycin Itching    Family History  Problem Relation Age of Onset  . Cancer Mother     liver lung  . Cancer Father     liver  . Diabetes Mother   . Diabetes Father     Prior to Admission medications   Medication Sig Start Date End Date Taking? Authorizing Provider  ALPRAZolam Prudy Feeler) 0.5 MG tablet Take 0.5 mg by mouth 3 (three) times daily.   Yes Historical Provider, MD  ciprofloxacin (CIPRO) 500 MG tablet Take 1 tablet (500 mg total) by mouth daily. Antibiotic to be taken for 4 more weeks. 06/20/15  Yes Elliot Cousin, MD  clindamycin (CLEOCIN) 300 MG capsule Take 1 capsule (300 mg total) by mouth 3 (three) times daily. Antibiotic to be taken for 4 more weeks. 06/20/15  Yes Elliot Cousin, MD  cyclobenzaprine (FLEXERIL) 10 MG tablet Take 10 mg by mouth 2 (two) times daily.   Yes Historical Provider, MD  furosemide (LASIX) 20 MG tablet Take 20 mg by mouth daily.  06/11/15  Yes Historical Provider, MD  insulin glargine (LANTUS) 100 UNIT/ML injection Inject 42 Units into the skin at bedtime.    Yes Historical Provider, MD  lisinopril (PRINIVIL,ZESTRIL) 20 MG tablet Discuss restarting Lisinopril with your kidney doctor. Patient taking differently: Take 20 mg by mouth daily.  06/20/15  Yes Elliot Cousin, MD  metoprolol (LOPRESSOR) 50 MG tablet Take 50 mg by mouth 2 (two) times daily.   Yes Historical Provider, MD  mometasone (NASONEX) 50 MCG/ACT nasal spray Place 2 sprays into the nose daily as needed (for allergies).    Yes Historical Provider, MD  NOVOLOG FLEXPEN 100 UNIT/ML FlexPen Inject 0-75 Units into the skin 3 (three) times daily after meals.  06/02/15  Yes Historical Provider, MD  omega-3 acid ethyl esters (LOVAZA) 1 G capsule Take 1,000  mg by mouth 2 (two) times daily. 05/06/15  Yes Historical Provider, MD  omeprazole (PRILOSEC) 40 MG capsule Take 40 mg by mouth daily.   Yes Historical Provider, MD  oxycodone (ROXICODONE) 30 MG immediate release tablet Take 30 mg by mouth 3 (three) times daily.    Yes Historical Provider, MD  Vilazodone HCl (VIIBRYD) 20 MG TABS Take 20 mg by mouth daily.   Yes Historical Provider, MD   Physical Exam: Filed Vitals:   07/08/15 0735 07/08/15 1034  BP: 131/68 112/68  Pulse: 108 92  Temp: 102.7 F (39.3 C) 98.6 F (37 C)  TempSrc: Oral Oral  Resp: 17 18  Height: 6\' 3"  (1.905 m)   Weight: 107.956 kg (238 lb)   SpO2: 97% 97%    Wt Readings from Last 3 Encounters:  07/08/15 107.956  kg (238 lb)  06/20/15 107.956 kg (238 lb)  06/15/15 112.492 kg (248 lb)    General:  Appears calm and comfortable Eyes: PERRL, normal lids, irises & conjunctiva ENT: grossly normal hearing, his membranes of his mouth slightly pale and dry Neck: no LAD, masses or thyromegaly Cardiovascular: RRR, + murmur. No LE edema. Respiratory: CTA bilaterally, no w/r/r. Normal respiratory effort. Abdomen: soft, ntnd positive bowel sounds throughout Skin: no rash or induration seen on limited exam extremities with healed scars. Right foot dressed with Ace bandage and wound VAC intact. Does have an odor dressing looks dry and intact Musculoskeletal: grossly normal tone BUE/BLE Psychiatric: grossly normal mood and affect, speech fluent and appropriate Neurologic: grossly non-focal.          Labs on Admission:  Basic Metabolic Panel:  Recent Labs Lab 07/06/15 1325 07/08/15 1057  NA 129* 129*  K 4.9 4.1  CL 95* 97*  CO2 23 20*  GLUCOSE 287* 335*  BUN 67* 59*  CREATININE 6.29* 5.33*  CALCIUM 7.9* 7.6*  MG  --  1.7  PHOS  --  5.6*   Liver Function Tests:  Recent Labs Lab 07/06/15 1325 07/08/15 1057  AST 12* 13*  ALT 15* 10*  ALKPHOS 116 99  BILITOT 0.2* 0.3  PROT 6.9 7.0  ALBUMIN 2.0* 1.7*   No  results for input(s): LIPASE, AMYLASE in the last 168 hours. No results for input(s): AMMONIA in the last 168 hours. CBC:  Recent Labs Lab 07/06/15 1325 07/08/15 1057  WBC 11.9* 14.0*  NEUTROABS 7.6 10.8*  HGB 8.1* 6.7*  HCT 24.2* 19.9*  MCV 83.7 83.6  PLT 347 365   Cardiac Enzymes: No results for input(s): CKTOTAL, CKMB, CKMBINDEX, TROPONINI in the last 168 hours.  BNP (last 3 results) No results for input(s): BNP in the last 8760 hours.  ProBNP (last 3 results) No results for input(s): PROBNP in the last 8760 hours.  CBG:  Recent Labs Lab 07/08/15 0739 07/08/15 1200  GLUCAP 358* 284*    Radiological Exams on Admission: Dg Chest Port 1 View  07/08/2015   CLINICAL DATA:  Sepsis.  EXAM: PORTABLE CHEST - 1 VIEW  COMPARISON:  None.  FINDINGS: Both lungs are clear. Heart and mediastinum are within normal limits. The trachea is midline. Negative for a pneumothorax. No acute bone abnormality.  IMPRESSION: No acute chest abnormality.   Electronically Signed   By: Richarda Overlie M.D.   On: 07/08/2015 11:23    EKG:   Assessment/Plan Principal Problem:   Sepsis: Likely related to ongoing infection in right foot in the setting of recent bacteremia. Will admit to telemetry. Will provide IV fluids per sepsis protocol. Will obtain blood cultures chest x-ray unremarkable. Await urinalysis. He has several antibody intolerances and allergies. After discussing with pharmacy and ID will provide ceftaroline. Monitor closely Active Problems: Acute renal failure superimposed on stage 4 chronic kidney disease: Worsening creatinine. Chart review indicates his baseline is 3-4. Currently over 6.    Anemia, chronic disease: Current hemoglobin 6.7. May be somewhat dilutional. Chart review indicates his baseline is between 9 and 10. May be some bleeding from that foot but otherwise no signs symptoms of obvious bleeding. Will obtain an and anemia panel. Will type and screen and transfuse as indicated.     Diabetic foot infection: He was scheduled for further debridement per podiatry. Will defer management to podiatry.      Diabetes mellitus with complication: Will obtain hemoglobin A1c. Home medications include Lantus,  NovoLog. Will provide Lantus at lower dose and continue sliding scale insulin for optimal control. At the time of admission patient did report feeling hungry and wanting food    Essential hypertension: In trolled on admission. Home medications include Lasix and lisinopril and metoprolol. I have held all of these secondary to #1 but anticipate resuming metoprolol as his blood pressure is stable and heart rate range 9200. Will monitor closely    Depression with anxiety: Appears stable at baseline. Continue home meds    GERD (gastroesophageal reflux disease): Continue home meds    Hyponatremia: Review indicates current levels close to his baseline and this is somewhat chronic. IV fluids as noted above.    Osteomyelitis of right foot: He has been on clindamycin and Cipro by mouth for the last 3 weeks. See #1.    Sleep apnea: Reports he does not have sleep apnea. Will request  Nephrology podiatry   Code Status: full DVT Prophylaxis: Family Communication: wife at bedside Disposition Plan: home when ready  Time spent: 65 minutes  [facility] Pager [phone]

## 2015-07-08 NOTE — Addendum Note (Signed)
Addended by: Marlana Mckowen on: 07/08/2015 11:06 PM   Modules accepted: Orders  

## 2015-07-08 NOTE — Progress Notes (Signed)
ANTIBIOTIC CONSULT NOTE - INITIAL  Pharmacy Consult for ceftaroline Indication: rule out sepsis  Allergies  Allergen Reactions  . Daptomycin     Hypotension   . Bactrim [Sulfamethoxazole-Trimethoprim] Other (See Comments)    Cannot take due to kidney issue  . Dapsone Nausea And Vomiting  . Latex Other (See Comments)    Blisters.   . Penicillins Hives  . Vancomycin Itching    Patient Measurements: Height:  (190.5 cm) Weight: 238 lb (107.956 kg) IBW/kg (Calculated) : 84.5  Vital Signs: Temp: 98.6 F (37 C) (08/10 1034) Temp Source: Oral (08/10 1034) BP: 112/68 mmHg (08/10 1034) Pulse Rate: 92 (08/10 1034) Intake/Output from previous day:   Intake/Output from this shift:    Labs:  Recent Labs  07/06/15 1325  WBC 11.9*  HGB 8.1*  PLT 347  CREATININE 6.29*   Estimated Creatinine Clearance: 20.7 mL/min (by C-G formula based on Cr of 6.29). No results for input(s): VANCOTROUGH, VANCOPEAK, VANCORANDOM, GENTTROUGH, GENTPEAK, GENTRANDOM, TOBRATROUGH, TOBRAPEAK, TOBRARND, AMIKACINPEAK, AMIKACINTROU, AMIKACIN in the last 72 hours.   Microbiology: Recent Results (from the past 720 hour(s))  Blood culture (routine x 2)     Status: None   Collection Time: 06/16/15  2:58 PM  Result Value Ref Range Status   Specimen Description BLOOD LEFT ANTECUBITAL  Final   Special Requests BOTTLES DRAWN AEROBIC AND ANAEROBIC 6CC EACH  Final   Culture NO GROWTH 5 DAYS  Final   Report Status 06/21/2015 FINAL  Final  Blood culture (routine x 2)     Status: None   Collection Time: 06/16/15  3:12 PM  Result Value Ref Range Status   Specimen Description BLOOD RIGHT ANTECUBITAL  Final   Special Requests   Final    BOTTLES DRAWN AEROBIC AND ANAEROBIC AEB=6CC ANA=5CC   Culture  Setup Time   Final    GRAM POSITIVE COCCI IN CHAINS FROM THE ANAEROBIC BOTTLE Gram Stain Report Called to,Read Back By and Verified With: MAYS,J. AT 1742 ON 06/17/2015 BY BAUGHAM,M. Performed at [facility]    Culture   Final    STREPTOCOCCUS AGALACTIAE Performed at [facility]    Report Status 06/19/2015 FINAL  Final   Organism ID, Bacteria STREPTOCOCCUS AGALACTIAE  Final      Susceptibility   Streptococcus agalactiae - MIC*    CLINDAMYCIN >=1 RESISTANT Resistant     AMPICILLIN <=0.25 SENSITIVE Sensitive     ERYTHROMYCIN >=8 RESISTANT Resistant     VANCOMYCIN 0.5 SENSITIVE Sensitive     CEFTRIAXONE <=0.12 SENSITIVE Sensitive     LEVOFLOXACIN 1 SENSITIVE Sensitive     * STREPTOCOCCUS AGALACTIAE  Culture, blood (routine x 2)     Status: None   Collection Time: 06/17/15  7:11 AM  Result Value Ref Range Status   Specimen Description BLOOD LEFT ARM  Final   Special Requests BOTTLES DRAWN AEROBIC AND ANAEROBIC 10CC  Final   Culture NO GROWTH 5 DAYS  Final   Report Status 06/22/2015 FINAL  Final  Culture, blood (routine x 2)     Status: None   Collection Time: 06/17/15  7:19 AM  Result Value Ref Range Status   Specimen Description BLOOD RIGHT HAND  Final   Special Requests BOTTLES DRAWN AEROBIC AND ANAEROBIC 10CC  Final   Culture NO GROWTH 5 DAYS  Final   Report Status 06/22/2015 FINAL  Final  Surgical pcr screen     Status: None   Collection Time: 06/18/15 10:10 PM  Result  Value Ref Range Status   MRSA, PCR NEGATIVE NEGATIVE Final   Staphylococcus aureus NEGATIVE NEGATIVE Final    Comment:        The Xpert SA Assay (FDA approved for NASAL specimens in patients over 31 years of age), is one component of a comprehensive surveillance program.  Test performance has been validated by [facility] for patients greater than or equal to 65 year old. It is not intended to diagnose infection nor to guide or monitor treatment.   Anaerobic culture     Status: None   Collection Time: 06/19/15  9:50 AM  Result Value Ref Range Status   Specimen Description WOUND FOOT  Final   Special Requests CIPRO AND CLEOCIN  Final   Gram Stain   Final    ABUNDANT WBC PRESENT,BOTH  PMN AND MONONUCLEAR NO SQUAMOUS EPITHELIAL CELLS SEEN ABUNDANT GRAM POSITIVE COCCI IN PAIRS IN CHAINS IN CLUSTERS Performed at [facility]    Culture   Final    NO ANAEROBES ISOLATED Performed at [facility]    Report Status 06/24/2015 FINAL  Final  Wound culture     Status: None   Collection Time: 06/19/15  9:50 AM  Result Value Ref Range Status   Specimen Description WOUND  Final   Special Requests NONE  Final   Gram Stain   Final    ABUNDANT WBC PRESENT,BOTH PMN AND MONONUCLEAR NO SQUAMOUS EPITHELIAL CELLS SEEN ABUNDANT GRAM POSITIVE COCCI IN PAIRS IN CHAINS IN CLUSTERS Performed at [facility]    Culture   Final    MODERATE GROUP B STREP(S.AGALACTIAE)ISOLATED Note: TESTING AGAINST S. AGALACTIAE NOT ROUTINELY PERFORMED DUE TO PREDICTABILITY OF AMP/PEN/VAN SUSCEPTIBILITY. Performed at [facility]    Report Status 06/22/2015 FINAL  Final    Medical History: Past Medical History  Diagnosis Date  . PVC (premature ventricular contraction)   . PAC (premature atrial contraction)   . Diabetes mellitus without complication   . Anxiety   . Depression   . Blood clot in vein     pt sts" it may have been in artery and not vein" of right leg  . GERD (gastroesophageal reflux disease)   . H/O hiatal hernia   . Barrett's esophagus   . Arthritis   . Neuropathy   . Herniated disc, cervical     c5 and c6 and c6 and c7  . Hypertension   . Kidney failure     stage 4 just dx 4 months ago.Sees Dr Elby Beck, nephrologist in [place] [phone].  .  . 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 Diabetic retinopathy of both eyes, associated with type 2 diabetes mellitus   . Osteomyelitis of right foot 06/17/2015    S/p debridement, Dr. [name]  . Diabetic foot infection 06/16/2015    S/p debridement and wound VAC  . Sepsis     Medications:  Prescriptions prior to admission  Medication Sig Dispense Refill Last  Dose  . ALPRAZolam (XANAX) 0.5 MG tablet Take 0.5 mg by mouth 3 (three) times daily.   07/08/2015 at 0600  . ciprofloxacin (CIPRO) 500 MG tablet Take 1 tablet (500 mg total) by mouth daily. Antibiotic to be taken for 4 more weeks. 30 tablet 0 07/07/2015 at Unknown time  . clindamycin (CLEOCIN) 300 MG capsule Take 1 capsule (300 mg total) by mouth 3 (three) times daily. Antibiotic to be taken for 4 more weeks. 84 capsule  0 07/07/2015 at Unknown time  . cyclobenzaprine (FLEXERIL) 10 MG tablet Take 10 mg by mouth 2 (two) times daily.   07/08/2015 at 0600  . furosemide (LASIX) 20 MG tablet Take 20 mg by mouth daily.    07/07/2015 at Unknown time  . insulin glargine (LANTUS) 100 UNIT/ML injection Inject 42 Units into the skin at bedtime.    07/07/2015 at 2130  . lisinopril (PRINIVIL,ZESTRIL) 20 MG tablet Discuss restarting Lisinopril with your kidney doctor. (Patient taking differently: Take 20 mg by mouth daily. )   07/08/2015 at 0600  . metoprolol (LOPRESSOR) 50 MG tablet Take 50 mg by mouth 2 (two) times daily.   07/08/2015 at 0600  . mometasone (NASONEX) 50 MCG/ACT nasal spray Place 2 sprays into the nose daily as needed (for allergies).    07/07/2015 at Unknown time  . NOVOLOG FLEXPEN 100 UNIT/ML FlexPen Inject 0-75 Units into the skin 3 (three) times daily after meals.   1 07/07/2015 at Unknown time  . omega-3 acid ethyl esters (LOVAZA) 1 G capsule Take 1,000 mg by mouth 2 (two) times daily.  11 07/07/2015 at Unknown time  . omeprazole (PRILOSEC) 40 MG capsule Take 40 mg by mouth daily.   07/08/2015 at 0600  . oxycodone (ROXICODONE) 30 MG immediate release tablet Take 30 mg by mouth 3 (three) times daily.    07/08/2015 at 0600  . Vilazodone HCl (VIIBRYD) 20 MG TABS Take 20 mg by mouth daily.   07/07/2015 at Unknown time  . [DISCONTINUED] ferrous sulfate 325 (65 FE) MG tablet Take 1 tablet (325 mg total) by mouth daily with breakfast. (Patient not taking: Reported on 06/30/2015)   Not Taking at Unknown time  .  [DISCONTINUED] sodium bicarbonate 650 MG tablet Take 1 tablet (650 mg total) by mouth 2 (two) times daily. (Patient not taking: Reported on 06/30/2015) 60 tablet 6 Not Taking at Unknown time   Assessment: 39 yo man with multiple medication allergies who was on clindamycin and cipro PTA for diabetic foot ulcer and osteomyelitis.  He presents today for debridement and was found to have fever.  Code sepsis called.  I spoke with ID pharmacist at [facility] who recommends monotherapy with ceftaroline.  Goal of Therapy:  Eradication of infection  Plan:  Ceftaroline 300 mg IV q12 hours, dose adjusted for renal function. F/u clinical response, cultures and renal function  Thanks for allowing pharmacy to be a part of this patient's care.  Talbert Cage, PharmD Clinical Pharmacist 07/08/2015,10:49 AM

## 2015-07-08 NOTE — Consult Note (Signed)
Maurice [name] MRN: [id number] DOB/AGE: [birth year] 40 y.o. Primary Care Physician:PROVIDER NOT IN SYSTEM Admit date: 07/08/2015 Chief Complaint:  No chief complaint on file.  HPI: Pt is 39 year male with pasy medical hx of CKD, DM who came to [facility] with c/o Foot ulcer.  HPI dates back to few months when pt developed ulcer in his foot, pt has been managed both as  inpt and outpt. Pt was admitted earlier when he has resection of fourth and fifth metatarsal. Pt was to have debridement today but he developed fever and had abnormal labs so was sent for admission. Pt follows with Nephrologist at [facility] and has baseline creat of 4-5s. Pt offers no c./o chest pain No c/o change in taste and appettite.  NO c/o nausea/ vomiting NO c/o freq/urgency/dyuria. NO c/o syncope No c/o hematuria    Past Medical History  Diagnosis Date  . PVC (premature ventricular contraction)   . PAC (premature atrial contraction)   . Diabetes mellitus without complication   . Anxiety   . Depression   . Blood clot in vein     pt sts" it may have been in artery and not vein" of right leg  . GERD (gastroesophageal reflux disease)   . H/O hiatal hernia   . Barrett's esophagus   . Arthritis   . Neuropathy   . Herniated disc, cervical     c5 and c6 and c6 and c7  . Hypertension   . Kidney failure     stage 4 just dx 4 months ago.Sees Dr Elby Beck, nephrologist in [place] [phone].  .  . 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 Diabetic retinopathy of both eyes, associated with type 2 diabetes mellitus   . Osteomyelitis of right foot 06/17/2015    S/p debridement, Dr. [name]  . Diabetic foot infection 06/16/2015    S/p debridement and wound VAC  . Sepsis        Family History  Problem Relation Age of Onset  . Cancer Mother     liver lung  . Cancer Father     liver  . Diabetes Mother   . Diabetes Father     Social History:  reports that he quit smoking about 3  weeks ago. His smoking use included Cigarettes. He has a 5 pack-year smoking history. His smokeless tobacco use includes Chew. He reports that he drinks alcohol. He reports that he does not use illicit drugs.   Allergies:  Allergies  Allergen Reactions  . Daptomycin     Hypotension   . Bactrim [Sulfamethoxazole-Trimethoprim] Other (See Comments)    Cannot take due to kidney issue  . Dapsone Nausea And Vomiting  . Latex Other (See Comments)    Blisters.   . Penicillins Hives  . Vancomycin Itching    Medications Prior to Admission  Medication Sig Dispense Refill  . ALPRAZolam (XANAX) 0.5 MG tablet Take 0.5 mg by mouth 3 (three) times daily.    . ciprofloxacin (CIPRO) 500 MG tablet Take 1 tablet (500 mg total) by mouth daily. Antibiotic to be taken for 4 more weeks. 30 tablet 0  . clindamycin (CLEOCIN) 300 MG capsule Take 1 capsule (300 mg total) by mouth 3 (three) times daily. Antibiotic to be taken for 4 more weeks. 84 capsule 0  . cyclobenzaprine (FLEXERIL) 10 MG tablet Take 10 mg by mouth 2 (two) times daily.    . furosemide (LASIX) 20 MG tablet  Take 20 mg by mouth daily.     . insulin glargine (LANTUS) 100 UNIT/ML injection Inject 42 Units into the skin at bedtime.     Marland Kitchen lisinopril (PRINIVIL,ZESTRIL) 20 MG tablet Discuss restarting Lisinopril with your kidney doctor. (Patient taking differently: Take 20 mg by mouth daily. )    . metoprolol (LOPRESSOR) 50 MG tablet Take 50 mg by mouth 2 (two) times daily.    . mometasone (NASONEX) 50 MCG/ACT nasal spray Place 2 sprays into the nose daily as needed (for allergies).     . NOVOLOG FLEXPEN 100 UNIT/ML FlexPen Inject 0-75 Units into the skin 3 (three) times daily after meals.   1  . omega-3 acid ethyl esters (LOVAZA) 1 G capsule Take 1,000 mg by mouth 2 (two) times daily.  11  . omeprazole (PRILOSEC) 40 MG capsule Take 40 mg by mouth daily.    Marland Kitchen oxycodone (ROXICODONE) 30 MG immediate release tablet Take 30 mg by mouth 3 (three) times  daily.     . Vilazodone HCl (VIIBRYD) 20 MG TABS Take 20 mg by mouth daily.    . [DISCONTINUED] ferrous sulfate 325 (65 FE) MG tablet Take 1 tablet (325 mg total) by mouth daily with breakfast. (Patient not taking: Reported on 06/30/2015)    . [DISCONTINUED] sodium bicarbonate 650 MG tablet Take 1 tablet (650 mg total) by mouth 2 (two) times daily. (Patient not taking: Reported on 06/30/2015) 60 tablet 6       ZOX:WRUEA from the symptoms mentioned above,there are no other symptoms referable to all systems reviewed.  . sodium chloride   Intravenous Once  . ALPRAZolam  0.5 mg Oral TID  . ceFTAROline (TEFLARO) IV  300 mg Intravenous Q12H  . cyclobenzaprine  10 mg Oral BID  . fluticasone  1 spray Each Nare Daily  . heparin  5,000 Units Subcutaneous 3 times per day  . insulin aspart  0-15 Units Subcutaneous TID WC  . insulin aspart  0-5 Units Subcutaneous QHS  . insulin glargine  15 Units Subcutaneous QHS  . pantoprazole  80 mg Oral Daily  . sodium chloride  3 mL Intravenous Q12H  . Vilazodone HCl  20 mg Oral Daily      Physical Exam: Vital signs in last 24 hours: Temp:  [98.6 F (37 C)-102.7 F (39.3 C)] 98.6 F (37 C) (08/10 1034) Pulse Rate:  [92-108] 98 (08/10 1409) Resp:  [17-18] 18 (08/10 1034) BP: (112-131)/(68) 112/68 mmHg (08/10 1034) SpO2:  [96 %-97 %] 96 % (08/10 1409) Weight:  [238 lb (107.956 kg)] 238 lb (107.956 kg) (08/10 0735) Weight change:     Intake/Output from previous day:       Physical Exam: General- pt is awake,alert, oriented to time place and person Resp- No acute REsp distress, CTA B/L NO Rhonchi CVS- S1S2 regular in rate and rhythm GIT- BS+, soft, NT, ND EXT- Right sided LE Edema wound vac in situ, NO Cyanosis CNS- CN 2-12 grossly intact. Moving all 4 extremities Psych- normal mood and affect   Lab Results: CBC  Recent Labs  07/06/15 1325 07/08/15 1057  WBC 11.9* 14.0*  HGB 8.1* 6.7*  HCT 24.2* 19.9*  PLT 347 365     BMET  Recent Labs  07/06/15 1325 07/08/15 1057  NA 129* 129*  K 4.9 4.1  CL 95* 97*  CO2 23 20*  GLUCOSE 287* 335*  BUN 67* 59*  CREATININE 6.29* 5.33*  CALCIUM 7.9* 7.6*   Creat trend 2016  6.29=>5.33  4.48=>4.0( July admission)          3-4 - baseline  2014  1.72  MICRO Recent Results (from the past 240 hour(s))  Culture, blood (x 2)     Status: None (Preliminary result)   Collection Time: 07/08/15 10:57 AM  Result Value Ref Range Status   Specimen Description BLOOD  Final   Special Requests NONE  Final   Culture NO GROWTH < 12 HOURS  Final   Report Status PENDING  Incomplete  Culture, blood (x 2)     Status: None (Preliminary result)   Collection Time: 07/08/15 11:10 AM  Result Value Ref Range Status   Specimen Description BLOOD  Final   Special Requests NONE  Final   Culture NO GROWTH < 12 HOURS  Final   Report Status PENDING  Incomplete      Lab Results  Component Value Date   CALCIUM 7.6* 07/08/2015   PHOS 5.6* 07/08/2015      Impression: 1)Renal  AKI secondary to prerenal/ ATN                AKI sec to Hypovolemia/ Sepsis.                AKI on CKD               CKD stage 4/5.               CKD since 2014 ( most likely before that)               CKD secondary to DM                Progression of CKD now marked with AKI                2)CVS- hemodynamically stable   3)Anemia HGb not at goal (9--11) Will need PRBC Will initiate anemia work up  4)CKD Mineral-Bone Disorder Calcium low Phosphorus high.   5)ID - admitted with Sepsis Primary MD following  6)Electrolytes Normokalemic  Hyponatremic  sec to Hyperglycemia and near ESRD   7)Acid base Co2 not  at goal     Plan:  Agree with PRBC Agree with asking for anemia work up Agree with current IVF Will suggest to check BMet q daily to see crea and Na trend Will start po bicarb Educated pt about fall in GFR and possible need of HD   soon.      Efraim Vanallen S 07/08/2015, 2:59 PM

## 2015-07-09 ENCOUNTER — Inpatient Hospital Stay (HOSPITAL_COMMUNITY): Payer: Medicare Other

## 2015-07-09 LAB — COMPREHENSIVE METABOLIC PANEL
ALT: 11 U/L — ABNORMAL LOW (ref 17–63)
ANION GAP: 7 (ref 5–15)
AST: 10 U/L — ABNORMAL LOW (ref 15–41)
Albumin: 1.6 g/dL — ABNORMAL LOW (ref 3.5–5.0)
Alkaline Phosphatase: 88 U/L (ref 38–126)
BILIRUBIN TOTAL: 0.5 mg/dL (ref 0.3–1.2)
BUN: 46 mg/dL — AB (ref 6–20)
CHLORIDE: 104 mmol/L (ref 101–111)
CO2: 21 mmol/L — AB (ref 22–32)
Calcium: 7.6 mg/dL — ABNORMAL LOW (ref 8.9–10.3)
Creatinine, Ser: 4.51 mg/dL — ABNORMAL HIGH (ref 0.61–1.24)
GFR calc Af Amer: 17 mL/min — ABNORMAL LOW (ref >60)
GFR, EST NON AFRICAN AMERICAN: 15 mL/min — AB (ref >60)
Glucose, Bld: 121 mg/dL — ABNORMAL HIGH (ref 65–99)
Potassium: 3.9 mmol/L (ref 3.5–5.1)
Sodium: 132 mmol/L — ABNORMAL LOW (ref 135–145)
Total Protein: 6.6 g/dL (ref 6.5–8.1)

## 2015-07-09 LAB — GLUCOSE, CAPILLARY
GLUCOSE-CAPILLARY: 188 mg/dL — AB (ref 65–99)
Glucose-Capillary: 111 mg/dL — ABNORMAL HIGH (ref 65–99)
Glucose-Capillary: 231 mg/dL — ABNORMAL HIGH (ref 65–99)
Glucose-Capillary: 188 mg/dL — ABNORMAL HIGH (ref 65–99)

## 2015-07-09 LAB — CBC
HCT: 25 % — ABNORMAL LOW (ref 39.0–52.0)
HCT: 24.1 % — ABNORMAL LOW (ref 39.0–52.0)
Hemoglobin: 8.5 g/dL — ABNORMAL LOW (ref 13.0–17.0)
Hemoglobin: 8.2 g/dL — ABNORMAL LOW (ref 13.0–17.0)
MCH: 28.5 pg (ref 26.0–34.0)
MCH: 28.5 pg (ref 26.0–34.0)
MCHC: 34 g/dL (ref 30.0–36.0)
MCHC: 34 g/dL (ref 30.0–36.0)
MCV: 83.9 fL (ref 78.0–100.0)
MCV: 83.7 fL (ref 78.0–100.0)
PLATELETS: 326 10*3/uL (ref 150–400)
Platelets: 347 10*3/uL (ref 150–400)
RBC: 2.98 MIL/uL — ABNORMAL LOW (ref 4.22–5.81)
RBC: 2.88 MIL/uL — ABNORMAL LOW (ref 4.22–5.81)
RDW: 13.7 % (ref 11.5–15.5)
RDW: 13.9 % (ref 11.5–15.5)
WBC: 13 10*3/uL — AB (ref 4.0–10.5)
WBC: 14.6 10*3/uL — ABNORMAL HIGH (ref 4.0–10.5)

## 2015-07-09 LAB — HEMOGLOBIN A1C
HEMOGLOBIN A1C: 9.6 % — AB (ref 4.8–5.6)
Mean Plasma Glucose: 229 mg/dL

## 2015-07-09 MED ORDER — CLINDAMYCIN PHOSPHATE 300 MG/50ML IV SOLN
300.0000 mg | Freq: Once | INTRAVENOUS | Status: DC
  Filled 2015-07-09: qty 50

## 2015-07-09 MED ORDER — POLYSACCHARIDE IRON COMPLEX 150 MG PO CAPS
150.0000 mg | ORAL_CAPSULE | Freq: Two times a day (BID) | ORAL | Status: DC
  Administered 2015-07-09 – 2015-07-11 (×4): 150 mg via ORAL
  Filled 2015-07-09 (×4): qty 1

## 2015-07-09 MED ORDER — CLINDAMYCIN PHOSPHATE 300 MG/50ML IV SOLN: 300.0000 mg | Freq: Once | INTRAVENOUS | Status: DC

## 2015-07-09 MED ORDER — VILAZODONE HCL 20 MG PO TABS: 20.0000 mg | ORAL_TABLET | Freq: Every day | ORAL | Status: DC

## 2015-07-09 MED ORDER — METOPROLOL TARTRATE 50 MG PO TABS
50.0000 mg | ORAL_TABLET | Freq: Two times a day (BID) | ORAL | Status: DC
  Administered 2015-07-09 – 2015-07-11 (×5): 50 mg via ORAL
  Filled 2015-07-09 (×5): qty 1

## 2015-07-09 MED ORDER — VILAZODONE HCL 40 MG PO TABS
20.0000 mg | ORAL_TABLET | Freq: Every day | ORAL | Status: DC
  Administered 2015-07-09 – 2015-07-10 (×2): 20 mg via ORAL

## 2015-07-09 NOTE — Progress Notes (Signed)
Brought Patient CPAP machine, patient stated he did not want to wear at this time he said he would let his nurse when he was ready and she could call me

## 2015-07-09 NOTE — Progress Notes (Addendum)
Subjective: Patient states that he is feeling much better. Presently he denies any nausea or vomiting. He denies also any difficulty in breathing.   Objective: Vital signs in last 24 hours: Temp:  [98.6 F (37 C)-100 F (37.8 C)] 99.2 F (37.3 C) (08/11 0612) Pulse Rate:  [92-114] 106 (08/11 0612) Resp:  [16-20] 20 (08/11 0612) BP: (112-175)/(50-88) 155/82 mmHg (08/11 0612) SpO2:  [93 %-100 %] 98 % (08/11 0612)  Intake/Output from previous day: 08/10 0701 - 08/11 0700 In: 1103 [P.O.:240; I.V.:250; Blood:613] Out: 200 [Urine:200] Intake/Output this shift: Total I/O In: 360 [P.O.:360] Out: -    Recent Labs  07/06/15 1325 07/08/15 1057 07/09/15 0559  HGB 8.1* 6.7* 8.5*    Recent Labs  07/08/15 1057 07/08/15 1101 07/09/15 0559  WBC 14.0*  --  13.0*  RBC 2.38* 2.40* 2.98*  HCT 19.9*  --  25.0*  PLT 365  --  347    Recent Labs  07/08/15 1057 07/09/15 0559  NA 129* 132*  K 4.1 3.9  CL 97* 104  CO2 20* 21*  BUN 59* 46*  CREATININE 5.33* 4.51*  GLUCOSE 335* 121*  CALCIUM 7.6* 7.6*    Recent Labs  07/08/15 1057  INR 1.24    Generally patient is alert and in no apparent distress. Chest is clear to auscultation Heart examination regular rate and rhythm Extremities no edema  Assessment/Plan: Problem #1 acute kidney injury: Possibly a compression of prerenal syndrome versus ATN. Presently his BUN and creatinine is improving and returning to his baseline. Problem #2 chronic renal failure: Thought to be secondary to diabetes/hypertension. Is stage IV. Problem #3 diabetes Problem #4 hypertension: His blood pressure is reasonably controlled Problem #5 diabetic foot infection: Presently on antibiotics. His a febrile today and his white blood cell count is improving. Problem #6 anemia: His hemoglobin has improved. Status post blood transfusion. Patient with iron saturation of 14% and ferritin of 324. Hence possibly a combination  of iron deficiency and anemia of  chronic disease. Problem #7 metabolic bone disease: Calcium slightly low. Problem #8  Metabolic acidosis: Presently he is on sodium bicarbonate and CO2 is improving. Plan: 1] we'll continue his present management 2] we'll start patient on Nu-Iron 150 mg by mouth twice a day 3] we'll check his basic metabolic panel in the morning.   Charlyne Robertshaw S 07/09/2015, 9:04 AM

## 2015-07-09 NOTE — Consult Note (Signed)
Podiatry Progress Note  Subjective: Maurice [name] is a 39 y.o. male being followed for osteomyelitis of the right foot.  Surgery is planned for tomorrow morning.  He denies any current nausea, vomiting, fever or chills.  The Wound VAC lost pressure last night.  A wet to dry dressing was applied by nursing staff as instructed.   Past Medical History  Diagnosis Date  . PVC (premature ventricular contraction)   . PAC (premature atrial contraction)   . Diabetes mellitus without complication   . Anxiety   . Depression   . Blood clot in vein     pt sts" it may have been in artery and not vein" of right leg  . GERD (gastroesophageal reflux disease)   . H/O hiatal hernia   . Barrett's esophagus   . Arthritis   . Neuropathy   . Herniated disc, cervical     c5 and c6 and c6 and c7  . Hypertension   . Kidney failure     stage 4 just dx 4 months ago.Sees Dr Elby Beck, nephrologist in [place] [phone].  .  . 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 Diabetic retinopathy of both eyes, associated with type 2 diabetes mellitus   . Osteomyelitis of right foot 06/17/2015    S/p debridement, Dr. [name]  . Diabetic foot infection 06/16/2015    S/p debridement and wound VAC  . Sepsis    Scheduled Meds: . ALPRAZolam  0.5 mg Oral TID  . ceFTAROline (TEFLARO) IV  300 mg Intravenous Q12H  . chlorhexidine   Topical Once  . cyclobenzaprine  10 mg Oral BID  . fluticasone  1 spray Each Nare Daily  . heparin  5,000 Units Subcutaneous 3 times per day  . insulin aspart  0-15 Units Subcutaneous TID WC  . insulin aspart  0-5 Units Subcutaneous QHS  . insulin glargine  15 Units Subcutaneous QHS  . pantoprazole  80 mg Oral Daily  . sodium bicarbonate  650 mg Oral TID  . sodium chloride  3 mL Intravenous Q12H  . Vilazodone HCl  20 mg Oral Daily   Continuous Infusions: . sodium chloride 75 mL/hr at 07/09/15 0224   PRN Meds:.oxycodone  Allergies  Allergen Reactions   . Daptomycin     Hypotension   . Bactrim [Sulfamethoxazole-Trimethoprim] Other (See Comments)    Cannot take due to kidney issue  . Dapsone Nausea And Vomiting  . Latex Other (See Comments)    Blisters.   . Penicillins Hives  . Vancomycin Itching   Past Surgical History  Procedure Laterality Date  . Wrist arthroscopy      left with no break reapir of tendond and ligamants  . Toe amputation      5th digit right foot-[place]  . Debridement leg      lower leg-left  . Metatarsal head excision Right 02/14/2013    Procedure: 4TH METATARSAL HEAD RESECTION RIGHT FOOT;  Surgeon: Dallas Schimke, DPM;  Location: [facility];  Service: Orthopedics;  Laterality: Right;  latex allergy  . Vitrectomy Right   . Application of wound vac Right 06/19/2015    Procedure: APPLICATION OF WOUND VAC;  Surgeon: [name], DPM;  Location: [facility];  Service: Podiatry;  Laterality: Right;  . Irrigation and debridement foot Right 06/19/2015    Procedure: IRRIGATION AND DEBRIDEMENT FOOT ;  Surgeon: [name], DPM;  Location: [facility];  Service: Podiatry;  Laterality: Right;  Family History  Problem Relation Age of Onset  . Cancer Mother     liver lung  . Cancer Father     liver  . Diabetes Mother   . Diabetes Father    Social History:  reports that he quit smoking about 3 weeks ago. His smoking use included Cigarettes. He has a 5 pack-year smoking history. His smokeless tobacco use includes Chew. He reports that he drinks alcohol. He reports that he does not use illicit drugs.  Physical Examination: Vital signs in last 24 hours:   Temp:  [98.6 F (37 C)-100 F (37.8 C)] 99.2 F (37.3 C) (08/11 0612) Pulse Rate:  [92-114] 106 (08/11 0612) Resp:  [16-20] 20 (08/11 0612) BP: (112-175)/(50-88) 155/82 mmHg (08/11 0612) SpO2:  [93 %-100 %] 98 % (08/11 0612)  DRESSING:  Wet to dry dressing.  INTEGUMENT:  There is a full thickness wound along the plantar lateral aspect of the right foot.   The distal and medial aspects of the wound is granular.  The proximal and lateral aspects of the wound is fibrotic with exposed fifth metatarsal.  The periwound tissue is less macerated than yesterday.  There is less malodor.   VASCULAR:  Pedal pulses are palpable bilaterally.  There is edema of the right foot. NEUROLOGIC:  Protective sensation is diminished bilaterally.  Lab/Test Results:    Recent Labs  07/08/15 1057 07/09/15 0559  WBC 14.0* 13.0*  HGB 6.7* 8.5*  HCT 19.9* 25.0*  PLT 365 347  NA 129* 132*  K 4.1 3.9  CL 97* 104  CO2 20* 21*  BUN 59* 46*  CREATININE 5.33* 4.51*  GLUCOSE 335* 121*  CALCIUM 7.6* 7.6*    Recent Results (from the past 240 hour(s))  Culture, blood (x 2)     Status: None (Preliminary result)   Collection Time: 07/08/15 10:57 AM  Result Value Ref Range Status   Specimen Description BLOOD  Final   Special Requests NONE  Final   Culture NO GROWTH < 12 HOURS  Final   Report Status PENDING  Incomplete  Culture, blood (x 2)     Status: None (Preliminary result)   Collection Time: 07/08/15 11:10 AM  Result Value Ref Range Status   Specimen Description BLOOD  Final   Special Requests NONE  Final   Culture NO GROWTH < 12 HOURS  Final   Report Status PENDING  Incomplete     Dg Chest Port 1 View  07/08/2015   CLINICAL DATA:  Sepsis.  EXAM: PORTABLE CHEST - 1 VIEW  COMPARISON:  None.  FINDINGS: Both lungs are clear. Heart and mediastinum are within normal limits. The trachea is midline. Negative for a pneumothorax. No acute bone abnormality.  IMPRESSION: No acute chest abnormality.   Electronically Signed   By: Richarda Overlie M.D.   On: 07/08/2015 11:23    Assessment: 1.  Osteomyelitis of the residual fifth metatarsal, right foot. 2.  Concern for osteomyelitis of the fourth metatarsal, right foot. 3.  Surgical wound of the right foot following debridement of ulcer (skin, subcutaneous tissue, muscle and bone), right foot for treatment of osteomyelitis of  the fifth metatarsal, right foot. 4.  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 5.  Renal disease.  Plan: Continue IV antibiotics per primary service.  I reapplied the Wound VAC at 125 mmHg of continuous pressure.  Debridement is planned for tomorrow morning.  The procedure was reviewed with the patient.  The benefits and risks have been discussed.  A written consent will be obtained.   Pietrina Jagodzinski IVAN 07/09/2015, 8:28 AM

## 2015-07-09 NOTE — Progress Notes (Signed)
Patient still not requiring CPAP at bedtime;patient will continue to be monitored by staff.

## 2015-07-09 NOTE — Progress Notes (Signed)
[facility] PROGRESS NOTE  Maurice [name] ZOX:[id number] DOB: Jul 30, 1976 DOA: 07/08/2015 PCP: PROVIDER NOT IN SYSTEM  Assessment/Plan: Sepsis: Likely related to ongoing infection in right foot in the setting of recent bacteremia. Max temp 100.2. Mildly tachycardic. Lactic acid within limits of normal. continue IV fluids. Blood cultures no growth to date.  chest x-ray unremarkable. Urinalysis unremarkable. Ceftaroline day #2. Monitor closely Active Problems: Acute renal failure superimposed on stage 4 chronic kidney disease: improving. Chart review indicates his baseline is 3-4.  Appreciate nephrology input and following recommendation   Anemia, chronic disease: Hemoglobin 8.5 s/p 2 units PRBC's. Anemia panel with Iron 19 TIBC 139. Nu-iron per nephrology   Diabetic foot infection: management per podiatry. Surgery planned for tomorrow    Diabetes mellitus with complication: Hemoglobin A1c 9.6. Home medications include Lantus, NovoLog. continue Lantus at lower dose and continue sliding scale insulin for optimal control.    Essential hypertension: only fair control. Home medications include Lasix and lisinopril and metoprolol. Resume metoprolol. monitor   Depression with anxiety: stable at baseline  GERD (gastroesophageal reflux disease): stable at baseline Continue home meds   Hyponatremia: improving with IV fluids. monitor   Osteomyelitis of right foot: He has been on clindamycin and Cipro by mouth for the last 3 weeks. See #1.   Sleep apnea: Reports he does not have sleep apnea. Will request   Code Status: full Family Communication: none present Disposition Plan: home when ready   Consultants:  podiatry  Procedures:  none  Antibiotics:  ceftaroline 07/08/15>>  HPI/Subjective: Reports feeling better this am.   Objective: Filed Vitals:   07/09/15 1001  BP: 163/76  Pulse: 113  Temp: 100.2 F (37.9 C)  Resp: 20    Intake/Output Summary (Last 24  hours) at 07/09/15 1300 Last data filed at 07/09/15 1236  Gross per 24 hour  Intake   1703 ml  Output    200 ml  Net   1503 ml   Filed Weights   07/08/15 0735  Weight: 107.956 kg (238 lb)    Exam:   General:  Well nourished looking older than stated age 39 years  Cardiovascular: RRR +murmur no LE edema. Right foot with ace dressing and wound vac in tact.   Respiratory: normal effort BS clear bilaterally no wheeze  Abdomen: full but soft +BS no guarding or rebound  Musculoskeletal: no clubbing or cyanosis   Data Reviewed: Basic Metabolic Panel:  Recent Labs Lab 07/06/15 1325 07/08/15 1057 07/09/15 0559  NA 129* 129* 132*  K 4.9 4.1 3.9  CL 95* 97* 104  CO2 23 20* 21*  GLUCOSE 287* 335* 121*  BUN 67* 59* 46*  CREATININE 6.29* 5.33* 4.51*  CALCIUM 7.9* 7.6* 7.6*  MG  --  1.7  --   PHOS  --  5.6*  --    Liver Function Tests:  Recent Labs Lab 07/06/15 1325 07/08/15 1057 07/09/15 0559  AST 12* 13* 10*  ALT 15* 10* 11*  ALKPHOS 116 99 88  BILITOT 0.2* 0.3 0.5  PROT 6.9 7.0 6.6  ALBUMIN 2.0* 1.7* 1.6*   No results for input(s): LIPASE, AMYLASE in the last 168 hours. No results for input(s): AMMONIA in the last 168 hours. CBC:  Recent Labs Lab 07/06/15 1325 07/08/15 1057 07/09/15 0559  WBC 11.9* 14.0* 13.0*  NEUTROABS 7.6 10.8*  --   HGB 8.1* 6.7* 8.5*  HCT 24.2* 19.9* 25.0*  MCV 83.7 83.6 83.9  PLT 347 365 347   Cardiac Enzymes:  No results for input(s): CKTOTAL, CKMB, CKMBINDEX, TROPONINI in the last 168 hours. BNP (last 3 results) No results for input(s): BNP in the last 8760 hours.  ProBNP (last 3 results) No results for input(s): PROBNP in the last 8760 hours.  CBG:  Recent Labs Lab 07/08/15 1200 07/08/15 1641 07/08/15 2207 07/09/15 0735 07/09/15 1111  GLUCAP 284* 169* 140* 111* 188*    Recent Results (from the past 240 hour(s))  Culture, blood (x 2)     Status: None (Preliminary result)   Collection Time: 07/08/15 10:57 AM   Result Value Ref Range Status   Specimen Description BLOOD LEFT ANTECUBITAL  Final   Special Requests   Final    BOTTLES DRAWN AEROBIC AND ANAEROBIC AEB=10CC ANA=6CC   Culture NO GROWTH 1 DAY  Final   Report Status PENDING  Incomplete  Culture, blood (x 2)     Status: None (Preliminary result)   Collection Time: 07/08/15 11:10 AM  Result Value Ref Range Status   Specimen Description BLOOD LEFT ARM  Final   Special Requests   Final    BOTTLES DRAWN AEROBIC AND ANAEROBIC AEB=8CC ANA=5CC   Culture NO GROWTH 1 DAY  Final   Report Status PENDING  Incomplete     Studies: Dg Chest Port 1 View  07/08/2015   CLINICAL DATA:  Sepsis.  EXAM: PORTABLE CHEST - 1 VIEW  COMPARISON:  None.  FINDINGS: Both lungs are clear. Heart and mediastinum are within normal limits. The trachea is midline. Negative for a pneumothorax. No acute bone abnormality.  IMPRESSION: No acute chest abnormality.   Electronically Signed   By: Richarda Overlie M.D.   On: 07/08/2015 11:23    Scheduled Meds: . ALPRAZolam  0.5 mg Oral TID  . ceFTAROline (TEFLARO) IV  300 mg Intravenous Q12H  . chlorhexidine   Topical Once  . cyclobenzaprine  10 mg Oral BID  . fluticasone  1 spray Each Nare Daily  . heparin  5,000 Units Subcutaneous 3 times per day  . insulin aspart  0-15 Units Subcutaneous TID WC  . insulin aspart  0-5 Units Subcutaneous QHS  . insulin glargine  15 Units Subcutaneous QHS  . iron polysaccharides  150 mg Oral BID  . pantoprazole  80 mg Oral Daily  . sodium bicarbonate  650 mg Oral TID  . sodium chloride  3 mL Intravenous Q12H  . Vilazodone HCl  20 mg Oral QHS   Continuous Infusions: . sodium chloride 75 mL/hr at 07/09/15 0224    Principal Problem:   Sepsis Active Problems:   Diabetic foot infection   Acute renal failure superimposed on stage 4 chronic kidney disease   Anemia, chronic disease   Diabetes mellitus with complication   Essential hypertension   Depression with anxiety   GERD  (gastroesophageal reflux disease)   Hyponatremia   Osteomyelitis of right foot   Sleep apnea    Time spent: 30 minutes    [facility] M  [facility] Pager [phone]. If 7PM-7AM, please contact night-coverage at [email or web address], password [facility] 07/09/2015, 1:00 PM  LOS: 1 day

## 2015-07-10 ENCOUNTER — Inpatient Hospital Stay (HOSPITAL_COMMUNITY): Payer: Medicare Other | Admitting: Anesthesiology

## 2015-07-10 ENCOUNTER — Inpatient Hospital Stay (HOSPITAL_COMMUNITY): Payer: Medicare Other

## 2015-07-10 ENCOUNTER — Encounter (HOSPITAL_COMMUNITY): Payer: Self-pay | Admitting: *Deleted

## 2015-07-10 ENCOUNTER — Encounter (HOSPITAL_COMMUNITY)
Admission: AD | Disposition: A | Discharge status: Home Health | Payer: Self-pay | Source: Ambulatory Visit | Attending: Internal Medicine

## 2015-07-10 HISTORY — PX: APPLICATION OF WOUND VAC: SHX5189

## 2015-07-10 HISTORY — PX: WOUND DEBRIDEMENT: SHX247

## 2015-07-10 LAB — CBC
HCT: 22.3 % — ABNORMAL LOW (ref 39.0–52.0)
Hemoglobin: 7.6 g/dL — ABNORMAL LOW (ref 13.0–17.0)
MCH: 28.7 pg (ref 26.0–34.0)
MCHC: 34.1 g/dL (ref 30.0–36.0)
MCV: 84.2 fL (ref 78.0–100.0)
PLATELETS: 312 10*3/uL (ref 150–400)
RBC: 2.65 MIL/uL — AB (ref 4.22–5.81)
RDW: 14 % (ref 11.5–15.5)
WBC: 13.2 10*3/uL — ABNORMAL HIGH (ref 4.0–10.5)

## 2015-07-10 LAB — BASIC METABOLIC PANEL
Anion gap: 8 (ref 5–15)
BUN: 39 mg/dL — ABNORMAL HIGH (ref 6–20)
CALCIUM: 7.8 mg/dL — AB (ref 8.9–10.3)
CHLORIDE: 104 mmol/L (ref 101–111)
CO2: 21 mmol/L — AB (ref 22–32)
CREATININE: 4.58 mg/dL — AB (ref 0.61–1.24)
GFR calc Af Amer: 17 mL/min — ABNORMAL LOW (ref >60)
GFR calc non Af Amer: 15 mL/min — ABNORMAL LOW (ref >60)
Glucose, Bld: 167 mg/dL — ABNORMAL HIGH (ref 65–99)
POTASSIUM: 3.7 mmol/L (ref 3.5–5.1)
Sodium: 133 mmol/L — ABNORMAL LOW (ref 135–145)

## 2015-07-10 LAB — SURGICAL PCR SCREEN
MRSA, PCR: NEGATIVE
Staphylococcus aureus: NEGATIVE

## 2015-07-10 LAB — GLUCOSE, CAPILLARY
GLUCOSE-CAPILLARY: 177 mg/dL — AB (ref 65–99)
GLUCOSE-CAPILLARY: 237 mg/dL — AB (ref 65–99)
Glucose-Capillary: 167 mg/dL — ABNORMAL HIGH (ref 65–99)
Glucose-Capillary: 169 mg/dL — ABNORMAL HIGH (ref 65–99)
Glucose-Capillary: 187 mg/dL — ABNORMAL HIGH (ref 65–99)

## 2015-07-10 LAB — PREPARE RBC (CROSSMATCH)

## 2015-07-10 SURGERY — DEBRIDEMENT, WOUND
Anesthesia: Monitor Anesthesia Care | Laterality: Right

## 2015-07-10 MED ORDER — BUPIVACAINE HCL (PF) 0.5 % IJ SOLN
INTRAMUSCULAR | Status: DC | PRN
  Administered 2015-07-10: 20 mL

## 2015-07-10 MED ORDER — PROPOFOL INFUSION 10 MG/ML OPTIME
INTRAVENOUS | Status: DC | PRN
  Administered 2015-07-10: 10:00:00 via INTRAVENOUS
  Administered 2015-07-10: 100 ug/kg/min via INTRAVENOUS

## 2015-07-10 MED ORDER — MIDAZOLAM HCL 5 MG/5ML IJ SOLN
INTRAMUSCULAR | Status: DC | PRN
  Administered 2015-07-10: 2 mg via INTRAVENOUS

## 2015-07-10 MED ORDER — FENTANYL CITRATE (PF) 100 MCG/2ML IJ SOLN
25.0000 ug | INTRAMUSCULAR | Status: AC
  Administered 2015-07-10 (×2): 25 ug via INTRAVENOUS

## 2015-07-10 MED ORDER — LIDOCAINE HCL (PF) 1 % IJ SOLN
INTRAMUSCULAR | Status: AC
  Filled 2015-07-10: qty 30

## 2015-07-10 MED ORDER — HYDROMORPHONE HCL 1 MG/ML IJ SOLN
INTRAMUSCULAR | Status: AC
  Filled 2015-07-10: qty 1

## 2015-07-10 MED ORDER — MIDAZOLAM HCL 2 MG/2ML IJ SOLN
1.0000 mg | INTRAMUSCULAR | Status: DC | PRN
  Administered 2015-07-10: 2 mg via INTRAVENOUS

## 2015-07-10 MED ORDER — ONDANSETRON HCL 4 MG/2ML IJ SOLN: 4.0000 mg | Freq: Once | INTRAMUSCULAR | Status: DC | PRN

## 2015-07-10 MED ORDER — FENTANYL CITRATE (PF) 100 MCG/2ML IJ SOLN
INTRAMUSCULAR | Status: DC | PRN
  Administered 2015-07-10 (×2): 25 ug via INTRAVENOUS
  Administered 2015-07-10: 50 ug via INTRAVENOUS

## 2015-07-10 MED ORDER — LACTATED RINGERS IV SOLN: INTRAVENOUS | Status: DC

## 2015-07-10 MED ORDER — BUPIVACAINE HCL (PF) 0.5 % IJ SOLN
INTRAMUSCULAR | Status: AC
  Filled 2015-07-10: qty 30

## 2015-07-10 MED ORDER — PROPOFOL 10 MG/ML IV BOLUS
INTRAVENOUS | Status: AC
  Filled 2015-07-10: qty 20

## 2015-07-10 MED ORDER — ONDANSETRON HCL 4 MG/2ML IJ SOLN
4.0000 mg | Freq: Once | INTRAMUSCULAR | Status: AC
  Administered 2015-07-10: 4 mg via INTRAVENOUS

## 2015-07-10 MED ORDER — SODIUM CHLORIDE 0.9 % IV SOLN
Freq: Once | INTRAVENOUS | Status: AC
  Administered 2015-07-10: 09:00:00 via INTRAVENOUS

## 2015-07-10 MED ORDER — SODIUM CHLORIDE 0.9 % IR SOLN
Status: DC | PRN
  Administered 2015-07-10: 1000 mL

## 2015-07-10 MED ORDER — FENTANYL CITRATE (PF) 100 MCG/2ML IJ SOLN
INTRAMUSCULAR | Status: AC
  Filled 2015-07-10: qty 2

## 2015-07-10 MED ORDER — HYDROMORPHONE HCL 1 MG/ML IJ SOLN
0.2500 mg | INTRAMUSCULAR | Status: DC | PRN
  Administered 2015-07-10 (×4): 0.5 mg via INTRAVENOUS
  Filled 2015-07-10: qty 1

## 2015-07-10 MED ORDER — FENTANYL CITRATE (PF) 100 MCG/2ML IJ SOLN
25.0000 ug | INTRAMUSCULAR | Status: DC | PRN
  Administered 2015-07-10 (×2): 50 ug via INTRAVENOUS

## 2015-07-10 MED ORDER — MIDAZOLAM HCL 2 MG/2ML IJ SOLN
INTRAMUSCULAR | Status: AC
  Filled 2015-07-10: qty 2

## 2015-07-10 MED ORDER — FENTANYL CITRATE (PF) 100 MCG/2ML IJ SOLN
INTRAMUSCULAR | Status: AC
  Filled 2015-07-10: qty 4

## 2015-07-10 MED ORDER — INSULIN GLARGINE 100 UNIT/ML ~~LOC~~ SOLN
22.0000 [IU] | Freq: Every day | SUBCUTANEOUS | Status: DC
  Administered 2015-07-10: 22 [IU] via SUBCUTANEOUS
  Filled 2015-07-10 (×3): qty 0.22

## 2015-07-10 MED ORDER — MIDAZOLAM HCL 2 MG/2ML IJ SOLN
INTRAMUSCULAR | Status: AC
  Filled 2015-07-10: qty 4

## 2015-07-10 MED ORDER — ONDANSETRON HCL 4 MG/2ML IJ SOLN
INTRAMUSCULAR | Status: AC
  Filled 2015-07-10: qty 2

## 2015-07-10 MED ORDER — SODIUM CHLORIDE 0.9 % IV SOLN
INTRAVENOUS | Status: DC | PRN
  Administered 2015-07-10: 10:00:00 via INTRAVENOUS

## 2015-07-10 SURGICAL SUPPLY — 38 items
BAG HAMPER (MISCELLANEOUS) ×3 IMPLANT
BANDAGE ELASTIC 4 VELCRO NS (GAUZE/BANDAGES/DRESSINGS) ×3 IMPLANT
BANDAGE ESMARK 4X12 BL STRL LF (DISPOSABLE) IMPLANT
BANDAGE GAUZE ELAST BULKY 4 IN (GAUZE/BANDAGES/DRESSINGS) ×3 IMPLANT
BLADE 15 SAFETY STRL DISP (BLADE) IMPLANT
BNDG CONFORM 2 STRL LF (GAUZE/BANDAGES/DRESSINGS) IMPLANT
BNDG ESMARK 4X12 BLUE STRL LF (DISPOSABLE)
BNDG GAUZE ELAST 4 BULKY (GAUZE/BANDAGES/DRESSINGS) IMPLANT
CANISTER WOUND CARE 500ML ATS (WOUND CARE) ×3 IMPLANT
CLOSURE WOUND 1/2 X4 (GAUZE/BANDAGES/DRESSINGS) ×1
CLOTH BEACON ORANGE TIMEOUT ST (SAFETY) ×3 IMPLANT
COVER LIGHT HANDLE STERIS (MISCELLANEOUS) ×6 IMPLANT
CUFF TOURNIQUET SINGLE 18IN (TOURNIQUET CUFF) ×3 IMPLANT
DRSG ADAPTIC 3X8 NADH LF (GAUZE/BANDAGES/DRESSINGS) IMPLANT
DRSG VAC ATS MED SENSATRAC (GAUZE/BANDAGES/DRESSINGS) ×3 IMPLANT
DRSG VAC ATS SM SENSATRAC (GAUZE/BANDAGES/DRESSINGS) IMPLANT
ELECT REM PT RETURN 9FT ADLT (ELECTROSURGICAL) ×3
ELECTRODE REM PT RTRN 9FT ADLT (ELECTROSURGICAL) ×1 IMPLANT
GAUZE SPONGE 4X4 12PLY STRL (GAUZE/BANDAGES/DRESSINGS) ×2 IMPLANT
GLOVE BIO SURGEON STRL SZ7.5 (GLOVE) ×3 IMPLANT
GLOVE BIOGEL PI IND STRL 7.0 (GLOVE) ×1 IMPLANT
GLOVE BIOGEL PI IND STRL 7.5 (GLOVE) ×1 IMPLANT
GLOVE BIOGEL PI INDICATOR 7.0 (GLOVE) ×2
GLOVE BIOGEL PI INDICATOR 7.5 (GLOVE) ×2
GOWN STRL REUS W/TWL LRG LVL3 (GOWN DISPOSABLE) ×6 IMPLANT
KIT ROOM TURNOVER AP CYSTO (KITS) ×3 IMPLANT
MANIFOLD NEPTUNE II (INSTRUMENTS) ×3 IMPLANT
NS IRRIG 1000ML POUR BTL (IV SOLUTION) ×3 IMPLANT
PACK BASIC LIMB (CUSTOM PROCEDURE TRAY) ×3 IMPLANT
PAD ARMBOARD 7.5X6 YLW CONV (MISCELLANEOUS) ×3 IMPLANT
SET BASIN LINEN APH (SET/KITS/TRAYS/PACK) ×3 IMPLANT
SPONGE GAUZE 4X4 12PLY (GAUZE/BANDAGES/DRESSINGS) ×3 IMPLANT
SPONGE LAP 18X18 X RAY DECT (DISPOSABLE) ×3 IMPLANT
STRIP CLOSURE SKIN 1/2X4 (GAUZE/BANDAGES/DRESSINGS) ×2 IMPLANT
SUT ETHIBOND CT1 BRD 2-0 30IN (SUTURE) ×3 IMPLANT
SWAB COLLECTION DEVICE MRSA (MISCELLANEOUS) ×3 IMPLANT
SWAB CULTURE LIQ STUART DBL (MISCELLANEOUS) ×3 IMPLANT
SYR CONTROL 10ML LL (SYRINGE) ×6 IMPLANT

## 2015-07-10 NOTE — Care Management Note (Signed)
Case Management Note  Patient Details  Name: Maurice [name] MRN: [id number] Date of Birth: 1976-04-29  Expected Discharge Date:                  Expected Discharge Plan:  Home w Home Health Services  In-House Referral:  NA  Discharge planning Services  CM Consult  Post Acute Care Choice:  Resumption of Svcs/PTA Provider Choice offered to:  Patient  DME Arranged:    DME Agency:     HH Arranged:  RN HH Agency:  [facility]  Status of Service:  Completed, signed off  Medicare Important Message Given:    Date Medicare IM Given:    Medicare IM give by:    Date Additional Medicare IM Given:    Additional Medicare Important Message give by:     If discussed at Long Length of Stay Meetings, dates discussed:    Additional Comments: Pt is from home, lives with his wife and is independent at baseline. Pt has wound vac from home and is active with [facility] for [facility]. [facility] will be notified of discharge if pt goes over weekend. No further CM needs.  Malcolm Metro, RN 07/10/2015, 2:19 PM

## 2015-07-10 NOTE — Anesthesia Postprocedure Evaluation (Signed)
  Anesthesia Post-op Note  Patient: Maurice [name]  Procedure(s) Performed: Procedure(s): DEBRIDEMENT WOUND SUBCUTANEOUS TISSUE, MUSCLE, BONE RIGHT FOOT (Right) APPLICATION OF WOUND VAC (Right)  Patient Location: PACU  Anesthesia Type:MAC  Level of Consciousness: awake, alert , oriented and patient cooperative  Airway and Oxygen Therapy: Patient Spontanous Breathing  Post-op Pain: none  Post-op Assessment: Post-op Vital signs reviewed, Patient's Cardiovascular Status Stable, Respiratory Function Stable, Patent Airway and No signs of Nausea or vomiting              Post-op Vital Signs: Reviewed and stable  Last Vitals:  Filed Vitals:   07/10/15 0925  BP: 178/84  Pulse:   Temp:   Resp: 23    Complications: No apparent anesthesia complications

## 2015-07-10 NOTE — Progress Notes (Signed)
Subjective: Patient offers no complaint and feeling good   Objective: Vital signs in last 24 hours: Temp:  [98.4 F (36.9 C)-100.5 F (38.1 C)] 98.9 F (37.2 C) (08/12 0815) Pulse Rate:  [81-119] 100 (08/12 0838) Resp:  [14-20] 16 (08/12 0838) BP: (146-182)/(74-85) 173/83 mmHg (08/12 0838) SpO2:  [94 %-100 %] 100 % (08/12 0838)  Intake/Output from previous day: 08/11 0701 - 08/12 0700 In: 840 [P.O.:840] Out: 250 [Urine:250] Intake/Output this shift: Total I/O In: 253 [I.V.:253] Out: -    Recent Labs  07/08/15 1057 07/09/15 0559 07/09/15 2155 07/10/15 0359  HGB 6.7* 8.5* 8.2* 7.6*    Recent Labs  07/09/15 2155 07/10/15 0359  WBC 14.6* 13.2*  RBC 2.88* 2.65*  HCT 24.1* 22.3*  PLT 326 312    Recent Labs  07/09/15 0559 07/10/15 0359  NA 132* 133*  K 3.9 3.7  CL 104 104  CO2 21* 21*  BUN 46* 39*  CREATININE 4.51* 4.58*  GLUCOSE 121* 167*  CALCIUM 7.6* 7.8*    Recent Labs  07/08/15 1057  INR 1.24    Generally patient is alert and in no apparent distress. Chest is clear to auscultation Heart examination regular rate and rhythm Extremities no edema  Assessment/Plan: Problem #1 acute kidney injury: Possibly a compression of prerenal syndrome versus ATN. 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 Problem #3 diabetes Problem #4 hypertension: His blood pressure is reasonably controlled Problem #5 diabetic foot infection: Patient is going to have surgery this morning Problem #6 anemia: His hemoglobin is low presently he is getting blood transfusion. Problem #7 metabolic bone disease: Calcium slightly low but improving Problem #8 Low CO2: Presently he is on sodium bicarbonate and CO2 is improving. Plan: 1] we'll continue his present management 2] He will be followed by Dr. Felipa Furnace his nephrolgist when he is  going to be discharged 3] we'll check his basic metabolic panel in the morning.   Zyra Parrillo S 07/10/2015, 8:49 AM

## 2015-07-10 NOTE — Progress Notes (Signed)
Made Dr Onalee Hua aware of Hgb 7.6. Waiting to hear back from dr.

## 2015-07-10 NOTE — Clinical Documentation Improvement (Signed)
  Consultant documented prerenal syndrome versus ATN,  with no mention of this diagnosis in your documentation.  Please indicate in your next progress note and discharge summary your agreement with consultant or provide clarification that this diagnosis is not a probable or suspected current condition.  Diagnosis: acute kidney injury: Possibly a compression of prerenal syndrome versus ATN Documented by: Nephrologists Dr. Kristian Covey and Dr. Wolfgang Phoenix Location: Nephrology consult notes 8/ 10 & 12/16 Creatinine values this admission: 5.33, 4.51, 4.58 Microscopic urine analysis shows the presence of granular cast.   Thank you, Alesia Richards, RN CDS [facility] Health Information Management [email or web address] [phone]

## 2015-07-10 NOTE — Progress Notes (Signed)
ANTIBIOTIC CONSULT NOTE - follow up  Pharmacy Consult for ceftaroline Indication: rule out sepsis / osteomyelitis  Allergies  Allergen Reactions  . Daptomycin     Hypotension   . Bactrim [Sulfamethoxazole-Trimethoprim] Other (See Comments)    Cannot take due to kidney issue  . Dapsone Nausea And Vomiting  . Latex Other (See Comments)    Blisters.   . Penicillins Hives  . Vancomycin Itching   Patient Measurements: Height: 6\' 3"  (190.5 cm) Weight: 238 lb (107.956 kg) IBW/kg (Calculated) : 84.5  Vital Signs: Temp: 97.6 F (36.4 C) (08/12 1203) Temp Source: Oral (08/12 1203) BP: 134/74 mmHg (08/12 1203) Pulse Rate: 78 (08/12 1203) Intake/Output from previous day: 08/11 0701 - 08/12 0700 In: 840 [P.O.:840] Out: 250 [Urine:250] Intake/Output from this shift: Total I/O In: 4604.7 [I.V.:4368; Blood:236.7] Out: 50 [Blood:50]  Labs:  Recent Labs  07/08/15 1057 07/09/15 0559 07/09/15 2155 07/10/15 0359  WBC 14.0* 13.0* 14.6* 13.2*  HGB 6.7* 8.5* 8.2* 7.6*  PLT 365 347 326 312  CREATININE 5.33* 4.51*  --  4.58*   Estimated Creatinine Clearance: 28.5 mL/min (by C-G formula based on Cr of 4.58). No results for input(s): VANCOTROUGH, VANCOPEAK, VANCORANDOM, GENTTROUGH, GENTPEAK, GENTRANDOM, TOBRATROUGH, TOBRAPEAK, TOBRARND, AMIKACINPEAK, AMIKACINTROU, AMIKACIN in the last 72 hours.   Microbiology: Recent Results (from the past 720 hour(s))  Blood culture (routine x 2)     Status: None   Collection Time: 06/16/15  2:58 PM  Result Value Ref Range Status   Specimen Description BLOOD LEFT ANTECUBITAL  Final   Special Requests BOTTLES DRAWN AEROBIC AND ANAEROBIC 6CC EACH  Final   Culture NO GROWTH 5 DAYS  Final   Report Status 06/21/2015 FINAL  Final  Blood culture (routine x 2)     Status: None   Collection Time: 06/16/15  3:12 PM  Result Value Ref Range Status   Specimen Description BLOOD RIGHT ANTECUBITAL  Final   Special Requests   Final    BOTTLES DRAWN AEROBIC  AND ANAEROBIC AEB=6CC ANA=5CC   Culture  Setup Time   Final    GRAM POSITIVE COCCI IN CHAINS FROM THE ANAEROBIC BOTTLE Gram Stain Report Called to,Read Back By and Verified With: MAYS,J. AT 1742 ON 06/17/2015 BY BAUGHAM,M. Performed at [facility]    Culture   Final    STREPTOCOCCUS AGALACTIAE Performed at [facility]    Report Status 06/19/2015 FINAL  Final   Organism ID, Bacteria STREPTOCOCCUS AGALACTIAE  Final      Susceptibility   Streptococcus agalactiae - MIC*    CLINDAMYCIN >=1 RESISTANT Resistant     AMPICILLIN <=0.25 SENSITIVE Sensitive     ERYTHROMYCIN >=8 RESISTANT Resistant     VANCOMYCIN 0.5 SENSITIVE Sensitive     CEFTRIAXONE <=0.12 SENSITIVE Sensitive     LEVOFLOXACIN 1 SENSITIVE Sensitive     * STREPTOCOCCUS AGALACTIAE  Culture, blood (routine x 2)     Status: None   Collection Time: 06/17/15  7:11 AM  Result Value Ref Range Status   Specimen Description BLOOD LEFT ARM  Final   Special Requests BOTTLES DRAWN AEROBIC AND ANAEROBIC 10CC  Final   Culture NO GROWTH 5 DAYS  Final   Report Status 06/22/2015 FINAL  Final  Culture, blood (routine x 2)     Status: None   Collection Time: 06/17/15  7:19 AM  Result Value Ref Range Status   Specimen Description BLOOD RIGHT HAND  Final   Special Requests BOTTLES DRAWN AEROBIC AND ANAEROBIC 10CC  Final   Culture NO GROWTH 5 DAYS  Final   Report Status 06/22/2015 FINAL  Final  Surgical pcr screen     Status: None   Collection Time: 06/18/15 10:10 PM  Result Value Ref Range Status   MRSA, PCR NEGATIVE NEGATIVE Final   Staphylococcus aureus NEGATIVE NEGATIVE Final    Comment:        The Xpert SA Assay (FDA approved for NASAL specimens in patients over 67 years of age), is one component of a comprehensive surveillance program.  Test performance has been validated by [employer] [facility] [employer] [facility] for patients greater than or equal to 52 year old. It is not intended to diagnose infection nor to guide or monitor  treatment.   Anaerobic culture     Status: None   Collection Time: 06/19/15  9:50 AM  Result Value Ref Range Status   Specimen Description WOUND FOOT  Final   Special Requests CIPRO AND CLEOCIN  Final   Gram Stain   Final    ABUNDANT WBC PRESENT,BOTH PMN AND MONONUCLEAR NO SQUAMOUS EPITHELIAL CELLS SEEN ABUNDANT GRAM POSITIVE COCCI IN PAIRS IN CHAINS IN CLUSTERS Performed at [facility]    Culture   Final    NO ANAEROBES ISOLATED Performed at [facility]    Report Status 06/24/2015 FINAL  Final  Wound culture     Status: None   Collection Time: 06/19/15  9:50 AM  Result Value Ref Range Status   Specimen Description WOUND  Final   Special Requests NONE  Final   Gram Stain   Final    ABUNDANT WBC PRESENT,BOTH PMN AND MONONUCLEAR NO SQUAMOUS EPITHELIAL CELLS SEEN ABUNDANT GRAM POSITIVE COCCI IN PAIRS IN CHAINS IN CLUSTERS Performed at [facility]    Culture   Final    MODERATE GROUP B STREP(S.AGALACTIAE)ISOLATED Note: TESTING AGAINST S. AGALACTIAE NOT ROUTINELY PERFORMED DUE TO PREDICTABILITY OF AMP/PEN/VAN SUSCEPTIBILITY. Performed at [facility]    Report Status 06/22/2015 FINAL  Final  Culture, blood (x 2)     Status: None (Preliminary result)   Collection Time: 07/08/15 10:57 AM  Result Value Ref Range Status   Specimen Description BLOOD LEFT ANTECUBITAL  Final   Special Requests   Final    BOTTLES DRAWN AEROBIC AND ANAEROBIC AEB=10CC ANA=6CC   Culture NO GROWTH 1 DAY  Final   Report Status PENDING  Incomplete  Culture, blood (x 2)     Status: None (Preliminary result)   Collection Time: 07/08/15 11:10 AM  Result Value Ref Range Status   Specimen Description BLOOD LEFT ARM  Final   Special Requests   Final    BOTTLES DRAWN AEROBIC AND ANAEROBIC AEB=8CC ANA=5CC   Culture NO GROWTH 1 DAY  Final   Report Status PENDING  Incomplete  Surgical pcr screen     Status: None   Collection Time: 07/09/15  9:15 PM  Result Value Ref Range  Status   MRSA, PCR NEGATIVE NEGATIVE Final   Staphylococcus aureus NEGATIVE NEGATIVE Final    Comment:        The Xpert SA Assay (FDA approved for NASAL specimens in patients over 76 years of age), is one component of a comprehensive surveillance program.  Test performance has been validated by [facility] for patients greater than or equal to 43 year old. It is not intended to diagnose infection nor to guide or monitor treatment.     Medical History: Past Medical History  Diagnosis Date  . PVC (premature  ventricular contraction)   . PAC (premature atrial contraction)   . Diabetes mellitus without complication   . Anxiety   . Depression   . Blood clot in vein     pt sts" it may have been in artery and not vein" of right leg  . GERD (gastroesophageal reflux disease)   . H/O hiatal hernia   . Barrett's esophagus   . Arthritis   . Neuropathy   . Herniated disc, cervical     c5 and c6 and c6 and c7  . Hypertension   . Kidney failure     stage 4 just dx 4 months ago.Sees Dr Elby Beck, nephrologist in [place] [phone].  .  . 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 Diabetic retinopathy of both eyes, associated with type 2 diabetes mellitus   . Osteomyelitis of right foot 06/17/2015    S/p debridement, Dr. [name]  . Diabetic foot infection 06/16/2015    S/p debridement and wound VAC  . Sepsis     Medications:  Prescriptions prior to admission  Medication Sig Dispense Refill Last Dose  . ALPRAZolam (XANAX) 0.5 MG tablet Take 0.5 mg by mouth 3 (three) times daily.   07/08/2015 at 0600  . ciprofloxacin (CIPRO) 500 MG tablet Take 1 tablet (500 mg total) by mouth daily. Antibiotic to be taken for 4 more weeks. 30 tablet 0 07/07/2015 at Unknown time  . clindamycin (CLEOCIN) 300 MG capsule Take 1 capsule (300 mg total) by mouth 3 (three) times daily. Antibiotic to be taken for 4 more weeks. 84 capsule 0 07/07/2015 at Unknown time  .  cyclobenzaprine (FLEXERIL) 10 MG tablet Take 10 mg by mouth 2 (two) times daily.   07/08/2015 at 0600  . furosemide (LASIX) 20 MG tablet Take 20 mg by mouth daily.    07/07/2015 at Unknown time  . insulin glargine (LANTUS) 100 UNIT/ML injection Inject 42 Units into the skin at bedtime.    07/07/2015 at 2130  . lisinopril (PRINIVIL,ZESTRIL) 20 MG tablet Discuss restarting Lisinopril with your kidney doctor. (Patient taking differently: Take 20 mg by mouth daily. )   07/08/2015 at 0600  . metoprolol (LOPRESSOR) 50 MG tablet Take 50 mg by mouth 2 (two) times daily.   07/10/2015 at 0600  . mometasone (NASONEX) 50 MCG/ACT nasal spray Place 2 sprays into the nose daily as needed (for allergies).    07/07/2015 at Unknown time  . NOVOLOG FLEXPEN 100 UNIT/ML FlexPen Inject 0-75 Units into the skin 3 (three) times daily after meals.   1 07/07/2015 at Unknown time  . omega-3 acid ethyl esters (LOVAZA) 1 G capsule Take 1,000 mg by mouth 2 (two) times daily.  11 07/07/2015 at Unknown time  . omeprazole (PRILOSEC) 40 MG capsule Take 40 mg by mouth daily.   07/08/2015 at 0600  . oxycodone (ROXICODONE) 30 MG immediate release tablet Take 30 mg by mouth 3 (three) times daily.    07/08/2015 at 0600  . Vilazodone HCl (VIIBRYD) 20 MG TABS Take 20 mg by mouth daily.   07/07/2015 at Unknown time  . [DISCONTINUED] ferrous sulfate 325 (65 FE) MG tablet Take 1 tablet (325 mg total) by mouth daily with breakfast. (Patient not taking: Reported on 06/30/2015)   Not Taking at Unknown time  . [DISCONTINUED] sodium bicarbonate 650 MG tablet Take 1 tablet (650 mg total) by mouth 2 (two) times daily. (Patient not taking: Reported on 06/30/2015) 60 tablet 6  Not Taking at Unknown time   Assessment: 39 yo man with multiple medication allergies who was on clindamycin and cipro PTA for diabetic foot ulcer and osteomyelitis.  He presents today for debridement and was found to have fever.  Code sepsis called.  ID pharmacist was called when admitted, recommends  monotherapy with ceftaroline.  SCr elevated.    Goal of Therapy:  Eradication of infection  Plan:  Ceftaroline 300 mg IV q12 hours, dose adjusted for renal function. F/u clinical response, cultures and renal function  Thanks for allowing pharmacy to be a part of this patient's care.  Valrie Hart, PharmD Clinical Pharmacist 07/10/2015,12:17 PM

## 2015-07-10 NOTE — Brief Op Note (Signed)
BRIEF OPERATIVE NOTE  SURGEON:   Dallas Schimke, DPM  OR STAFF:   Circulator: Horton Marshall, RN Scrub Person: Jonell Cluck, CST   PREOPERATIVE DIAGNOSIS:   1.  Osteomyelitis of the base of the 4th metatarsal, right foot. 2.  Osteomyelitis of the residual 5th metatarsal, right foot. 3.  Nonhealing surgical wound, right foot. 4.  Diabetes mellitus with peripheral neuropathy.  POSTOPERATIVE DIAGNOSIS: Same  PROCEDURE: 1.  Debridement of skin, subcutaneous tissue, muscle and bone, right foot. 2.  Application of KCI Wound VAC, right foot.  ANESTHESIA:  Monitor Anesthesia Care   HEMOSTASIS:   Pneumatic ankle tourniquet set at 250 mmHg  ESTIMATED BLOOD LOSS:   ~50 cc  MATERIALS USED:  White and black KCI Wound Vac sponge (2 pieces total)  INJECTABLES: Marcaine 0.5% plain; 20mL  PATHOLOGY:   1.  5th metatarsal, right foot. 2.  Aerobic culture 3.  Anaerobic culture  COMPLICATIONS:   None  DICTATION:  Note written in EPIC

## 2015-07-10 NOTE — Anesthesia Preprocedure Evaluation (Signed)
Anesthesia Evaluation  Patient identified by MRN, date of birth, ID band Patient awake    Reviewed: Allergy & Precautions, H&P , NPO status , Patient's Chart, lab work & pertinent test results  Airway Mallampati: I  TM Distance: >3 FB     Dental  (+) Teeth Intact   Pulmonary neg pulmonary ROS, sleep apnea , Current Smoker, former smoker,  breath sounds clear to auscultation        Cardiovascular hypertension, Pt. on medications + dysrhythmias Rhythm:Regular Rate:Normal     Neuro/Psych PSYCHIATRIC DISORDERS Anxiety Depression    GI/Hepatic hiatal hernia, GERD-  Medicated,  Endo/Other  diabetes, Poorly Controlled, Type 2, Insulin Dependent  Renal/GU CRFRenal disease     Musculoskeletal   Abdominal   Peds  Hematology  (+) anemia ,   Anesthesia Other Findings   Reproductive/Obstetrics                             Anesthesia Physical Anesthesia Plan  ASA: III  Anesthesia Plan: MAC   Post-op Pain Management:    Induction: Intravenous  Airway Management Planned: Simple Face Mask  Additional Equipment:   Intra-op Plan:   Post-operative Plan:   Informed Consent: I have reviewed the patients History and Physical, chart, labs and discussed the procedure including the risks, benefits and alternatives for the proposed anesthesia with the patient or authorized representative who has indicated his/her understanding and acceptance.     Plan Discussed with:   Anesthesia Plan Comments: (Transfusion 1 PRBC in progress.)        Anesthesia Quick Evaluation

## 2015-07-10 NOTE — OR Nursing (Signed)
Dr. [name]  Is aware of Hgb, Hct,  WBC , temp.  Ok with proceeding with surgery.

## 2015-07-10 NOTE — Progress Notes (Signed)
[facility] PROGRESS NOTE  Maurice [name] WUJ:[id number] DOB: [birth year] DOA: 07/08/2015 PCP: PROVIDER NOT IN SYSTEM  Assessment/Plan: Sepsis: resolved this am.  Likely related to ongoing infection in right foot in the setting of recent bacteremia. S/p surgical debridement today.  Max temp 99.4. Tachycardia resolved.  Lactic acid within limits of normal. continue IV fluids. Blood cultures no growth to date. chest x-ray unremarkable. Urinalysis unremarkable. Ceftaroline day #3. Monitor closely Active Problems: Acute renal failure superimposed on stage 4 chronic kidney disease: improving. Chart review indicates his baseline is 3-4. Creatinine stable at 4.5. Appreciate nephrology input and following recommendation    Anemia, chronic disease: s/p 3 units  PRBC's packed RBC's since admission. Anemia panel with Iron 19 TIBC 139. Nu-iron per nephrology. monitor   Diabetic foot infection: management per podiatry. Surgery today.    Diabetes mellitus with complication: Hemoglobin A1c 9.6. Home medications include Lantus, NovoLog. CBG range 167-177. Will increase lantus dose to 22 (home dose 42)and continue sliding scale insulin for optimal control. Appetite improving.    Essential hypertension: controlled. Continue BB and hold lasix and ACE I   Depression with anxiety: stable at baseline  GERD (gastroesophageal reflux disease): stable at baseline Continue home meds   Hyponatremia: continues to improve.    Osteomyelitis of right foot: He had been on clindamycin and Cipro by mouth for the last 3 weeks. See #1.   Sleep apnea: Reports he does not have sleep apnea. Will request    Code Status: full Family Communication: none present Disposition Plan: home when ready   Consultants:  podiatry  Procedures: Debridement of skin, subcutaneous tissue, muscle and bone, right foot.  Application of KCI Wound VAC, right foot.   Antibiotics:  ceftaroline  07/08/15>>  HPI/Subjective: Awake alert. Denies pain/discomfort  Objective: Filed Vitals:   07/10/15 1203  BP: 134/74  Pulse: 78  Temp: 97.6 F (36.4 C)  Resp: 18    Intake/Output Summary (Last 24 hours) at 07/10/15 1236 Last data filed at 07/10/15 1030  Gross per 24 hour  Intake 4844.67 ml  Output    300 ml  Net 4544.67 ml   Filed Weights   07/08/15 0735  Weight: 107.956 kg (238 lb)    Exam:   General:  Obese appears comfortable  Cardiovascular: RRR no MGR no LE edema  Respiratory: normal effort BS clear bilaterally no wheeze  Abdomen: soft +bs non-tender  Musculoskeletal: right foot with dressing dry and intact  Data Reviewed: Basic Metabolic Panel:  Recent Labs Lab 07/06/15 1325 07/08/15 1057 07/09/15 0559 07/10/15 0359  NA 129* 129* 132* 133*  K 4.9 4.1 3.9 3.7  CL 95* 97* 104 104  CO2 23 20* 21* 21*  GLUCOSE 287* 335* 121* 167*  BUN 67* 59* 46* 39*  CREATININE 6.29* 5.33* 4.51* 4.58*  CALCIUM 7.9* 7.6* 7.6* 7.8*  MG  --  1.7  --   --   PHOS  --  5.6*  --   --    Liver Function Tests:  Recent Labs Lab 07/06/15 1325 07/08/15 1057 07/09/15 0559  AST 12* 13* 10*  ALT 15* 10* 11*  ALKPHOS 116 99 88  BILITOT 0.2* 0.3 0.5  PROT 6.9 7.0 6.6  ALBUMIN 2.0* 1.7* 1.6*   No results for input(s): LIPASE, AMYLASE in the last 168 hours. No results for input(s): AMMONIA in the last 168 hours. CBC:  Recent Labs Lab 07/06/15 1325 07/08/15 1057 07/09/15 0559 07/09/15 2155 07/10/15 0359  WBC 11.9* 14.0* 13.0* 14.6*  13.2*  NEUTROABS 7.6 10.8*  --   --   --   HGB 8.1* 6.7* 8.5* 8.2* 7.6*  HCT 24.2* 19.9* 25.0* 24.1* 22.3*  MCV 83.7 83.6 83.9 83.7 84.2  PLT 347 365 347 326 312   Cardiac Enzymes: No results for input(s): CKTOTAL, CKMB, CKMBINDEX, TROPONINI in the last 168 hours. BNP (last 3 results) No results for input(s): BNP in the last 8760 hours.  ProBNP (last 3 results) No results for input(s): PROBNP in the last 8760  hours.  CBG:  Recent Labs Lab 07/09/15 1606 07/09/15 2146 July 17, 2015 0734 07/17/15 1044 July 17, 2015 1210  GLUCAP 231* 188* 167* 169* 177*    Recent Results (from the past 240 hour(s))  Culture, blood (x 2)     Status: None (Preliminary result)   Collection Time: 07/08/15 10:57 AM  Result Value Ref Range Status   Specimen Description BLOOD LEFT ANTECUBITAL  Final   Special Requests   Final    BOTTLES DRAWN AEROBIC AND ANAEROBIC AEB=10CC ANA=6CC   Culture NO GROWTH 1 DAY  Final   Report Status PENDING  Incomplete  Culture, blood (x 2)     Status: None (Preliminary result)   Collection Time: 07/08/15 11:10 AM  Result Value Ref Range Status   Specimen Description BLOOD LEFT ARM  Final   Special Requests   Final    BOTTLES DRAWN AEROBIC AND ANAEROBIC AEB=8CC ANA=5CC   Culture NO GROWTH 1 DAY  Final   Report Status PENDING  Incomplete  Surgical pcr screen     Status: None   Collection Time: 07/09/15  9:15 PM  Result Value Ref Range Status   MRSA, PCR NEGATIVE NEGATIVE Final   Staphylococcus aureus NEGATIVE NEGATIVE Final    Comment:        The Xpert SA Assay (FDA approved for NASAL specimens in patients over 14 years of age), is one component of a comprehensive surveillance program.  Test performance has been validated by [facility] for patients greater than or equal to 75 year old. It is not intended to diagnose infection nor to guide or monitor treatment.      Studies: Dg Foot Complete Right  07-17-15   CLINICAL DATA:  Interval fifth metatarsal resection.  EXAM: RIGHT FOOT COMPLETE - 3+ VIEW  COMPARISON:  07/09/2015  FINDINGS: There is evidence of prior fourth metatarsal head resection. There is interval resection of the base of the fifth metatarsal. There appears to be debridement along the lateral margin at the base of the right fourth metatarsal. There a wound-vac at the surgical site.  There is no acute fracture or dislocation. 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  There are no radiopaque foreign bodies.  IMPRESSION: There is evidence of prior fourth metatarsal head resection. There is interval resection of the base of the fifth metatarsal. There appears to be debridement along the lateral margin at the base of the right fourth metatarsal. There a wound-vac at the surgical site.   Electronically Signed   By: Elige Ko   On: 07/17/15 12:04   Dg Foot Complete Right  July 17, 2015   CLINICAL DATA:  Right foot infection. Preoperative radiograph. Subsequent encounter.  EXAM: RIGHT FOOT COMPLETE - 3+ VIEW  COMPARISON:  Right foot radiographs performed 07/06/2015, and right foot MRI performed 06/16/2015  FINDINGS: Previously noted osteomyelitis at the lateral aspect of the base of the fourth metatarsal and small remnant of the fifth metatarsal is grossly unchanged in appearance. An overlying  wound VAC is noted, with associated soft tissue defect.  Slightly increased lucency with regard to the fourth proximal phalanx is thought to be artifactual in nature. The patient is status post remote fourth metatarsal head resection. Visualized joint spaces are grossly preserved.  IMPRESSION: Osteomyelitis at the lateral aspect of the base of the fourth metatarsal and small remnant of the fifth metatarsal, first noted on prior MRI, is grossly unchanged.   Electronically Signed   By: Roanna Raider M.D.   On: 07/10/2015 00:45    Scheduled Meds: . ALPRAZolam  0.5 mg Oral TID  . ceFTAROline (TEFLARO) IV  300 mg Intravenous Q12H  . chlorhexidine   Topical Once  . cyclobenzaprine  10 mg Oral BID  . fluticasone  1 spray Each Nare Daily  . insulin aspart  0-15 Units Subcutaneous TID WC  . insulin aspart  0-5 Units Subcutaneous QHS  . insulin glargine  15 Units Subcutaneous QHS  . iron polysaccharides  150 mg Oral BID  . metoprolol tartrate  50 mg Oral BID  . pantoprazole  80 mg Oral Daily  . sodium bicarbonate  650 mg Oral TID  . sodium chloride  3 mL  Intravenous Q12H  . Vilazodone HCl  20 mg Oral QHS   Continuous Infusions: . sodium chloride 75 mL/hr at 07/09/15 0224    Principal Problem:   Sepsis Active Problems:   Diabetic foot infection   Acute renal failure superimposed on stage 4 chronic kidney disease   Anemia, chronic disease   Diabetes mellitus with complication   Essential hypertension   Depression with anxiety   GERD (gastroesophageal reflux disease)   Hyponatremia   Osteomyelitis of right foot   Sleep apnea    Time spent: 30 minutes    [facility]  [facility] Pager [phone]. If 7PM-7AM, please contact night-coverage at [email or web address], password [facility] 07/10/2015, 12:36 PM  LOS: 2 days

## 2015-07-10 NOTE — Transfer of Care (Signed)
Immediate Anesthesia Transfer of Care Note  Patient: Maurice [name]  Procedure(s) Performed: Procedure(s): DEBRIDEMENT WOUND SUBCUTANEOUS TISSUE, MUSCLE, BONE RIGHT FOOT (Right) APPLICATION OF WOUND VAC (Right)  Patient Location: PACU  Anesthesia Type:MAC  Level of Consciousness: awake, alert , oriented and patient cooperative  Airway & Oxygen Therapy: Patient Spontanous Breathing and Patient connected to nasal cannula oxygen  Post-op Assessment: Report given to RN, Post -op Vital signs reviewed and stable and Patient moving all extremities  Post vital signs: Reviewed and stable  Last Vitals:  Filed Vitals:   07/10/15 0925  BP: 178/84  Pulse:   Temp:   Resp: 23    Complications: No apparent anesthesia complications

## 2015-07-10 NOTE — Progress Notes (Signed)
xrays to right foot done per xray.

## 2015-07-10 NOTE — Op Note (Signed)
OPERATIVE NOTE  DATE OF PROCEDURE:  07/10/2015  SURGEON:   Dallas Schimke, DPM  OR STAFF:   Circulator: Horton Marshall, RN Scrub Person: Jonell Cluck, CST   PREOPERATIVE DIAGNOSIS:   1.  Osteomyelitis of the base of the 4th metatarsal, right foot. 2.  Osteomyelitis of the residual 5th metatarsal, right foot. 3.  Nonhealing surgical wound, right foot. 4.  Diabetes mellitus with peripheral neuropathy.  POSTOPERATIVE DIAGNOSIS: Same  PROCEDURE: 1.  Debridement of skin, subcutaneous tissue, muscle and bone, right foot. 2.  Application of KCI Wound VAC, right foot.  ANESTHESIA:  Monitor Anesthesia Care   HEMOSTASIS:   Pneumatic ankle tourniquet set at 250 mmHg  ESTIMATED BLOOD LOSS:   ~50 cc  MATERIALS USED:  White and black KCI Wound Vac sponge (2 pieces total)  INJECTABLES: Marcaine 0.5% plain; 20mL  PATHOLOGY:   1.  5th metatarsal, right foot. 2.  Aerobic culture 3.  Anaerobic culture  COMPLICATIONS:   None  INDICATIONS:  Chronic nonhealing ulceration / wound of the right foot with underlying osteomyelitis of the fourth and fifth metatarsal bones.  DESCRIPTION OF THE PROCEDURE:  The patient was brought to the operating room and placed on the operative table in the supine position.  A pneumatic ankle tourniquet was applied to the patient's ankle.  Following sedation, the surgical site was anesthetized with 0.5% Marcaine plain.  The foot was then prepped, scrubbed, and draped in the usual sterile technique.  The foot was elevated, exsanguinated and the pneumatic ankle tourniquet inflated to 250 mmHg.    Attention was directed to the plantar lateral aspect of the right foot.  A full-thickness wound was noted measuring 9.5 cm in length by 9.1 cm in width by 2.0 cm in depth.  The wound bed was granular medially.  The wound bed was fibrotic overlying the base of the fourth and fifth metatarsal bones.  The fifth metatarsal bone was exposed, brown in color and soft.   The distal aspect of the peroneus brevis tendon was necrotic and nonviable.  Using a #15 blade the fifth metatarsal bone was freed of all soft tissue attachments and passed from the operative field.  It was sent to pathology for evaluation.  The lateral aspect of the base of the fourth metatarsal was found to be soft.  The fourth metatarsal bone was debrided using a bone rongeur down to bleeding viable bone.  The wound bed was debrided of all nonviable fibrotic tissue using a #15 blade and soft tissue curette.  Aerobic and anaerobic cultures were obtained.  The wound was irrigated with copious amounts of sterile irrigant.  Following debridement, the wound measure 9.5 cm in length by 9.1 cm in width by 3.5 cm in depth with a granular bleeding base.  A white KCI sponge was cut and placed over the exposed cuboid and fourth metatarsal base.  A black sponge was cut to fit the dimensions of the wound and secured using drape.  The Wound VAC was connected and found to be operational at 125 mmHg of continuous pressure.  The pneumatic ankle tourniquet was deflated and a prompt hyperemic response was noted to all digits of the operative foot.   The patient tolerated the procedure well.  The patient was then transferred to PACU with vital signs stable and vascular status intact to all toes of the operative foot.  Following a period of postoperative monitoring, the patient will be discharged home.

## 2015-07-11 LAB — BASIC METABOLIC PANEL
ANION GAP: 7 (ref 5–15)
BUN: 35 mg/dL — AB (ref 6–20)
CALCIUM: 7.6 mg/dL — AB (ref 8.9–10.3)
CHLORIDE: 105 mmol/L (ref 101–111)
CO2: 21 mmol/L — ABNORMAL LOW (ref 22–32)
Creatinine, Ser: 4.41 mg/dL — ABNORMAL HIGH (ref 0.61–1.24)
GFR calc Af Amer: 18 mL/min — ABNORMAL LOW (ref >60)
GFR calc non Af Amer: 15 mL/min — ABNORMAL LOW (ref >60)
Glucose, Bld: 165 mg/dL — ABNORMAL HIGH (ref 65–99)
POTASSIUM: 3.8 mmol/L (ref 3.5–5.1)
Sodium: 133 mmol/L — ABNORMAL LOW (ref 135–145)

## 2015-07-11 LAB — CBC
HCT: 23.8 % — ABNORMAL LOW (ref 39.0–52.0)
Hemoglobin: 7.9 g/dL — ABNORMAL LOW (ref 13.0–17.0)
MCH: 28 pg (ref 26.0–34.0)
MCHC: 33.2 g/dL (ref 30.0–36.0)
MCV: 84.4 fL (ref 78.0–100.0)
Platelets: 316 K/uL (ref 150–400)
RBC: 2.82 MIL/uL — ABNORMAL LOW (ref 4.22–5.81)
RDW: 13.9 % (ref 11.5–15.5)
WBC: 13.5 K/uL — ABNORMAL HIGH (ref 4.0–10.5)

## 2015-07-11 LAB — GLUCOSE, CAPILLARY
GLUCOSE-CAPILLARY: 195 mg/dL — AB (ref 65–99)
Glucose-Capillary: 149 mg/dL — ABNORMAL HIGH (ref 65–99)

## 2015-07-11 MED ORDER — FUROSEMIDE 20 MG PO TABS: 20.0000 mg | ORAL_TABLET | Freq: Every day | ORAL | Status: DC | PRN

## 2015-07-11 MED ORDER — POLYSACCHARIDE IRON COMPLEX 150 MG PO CAPS: 150.0000 mg | ORAL_CAPSULE | Freq: Two times a day (BID) | ORAL | Status: AC

## 2015-07-11 MED ORDER — CLINDAMYCIN HCL 300 MG PO CAPS: 300.0000 mg | ORAL_CAPSULE | Freq: Three times a day (TID) | ORAL | Status: DC

## 2015-07-11 NOTE — Progress Notes (Signed)
Podiatry Progress Note  Subjective: Maurice [name] is a 39 y.o. male POD 1 S/P debridement of his right foot and Wound VAC placement.  He denies any current nausea, vomiting, fever or chills.     Past Medical History  Diagnosis Date  . PVC (premature ventricular contraction)   . PAC (premature atrial contraction)   . Diabetes mellitus without complication   . Anxiety   . Depression   . Blood clot in vein     pt sts" it may have been in artery and not vein" of right leg  . GERD (gastroesophageal reflux disease)   . H/O hiatal hernia   . Barrett's esophagus   . Arthritis   . Neuropathy   . Herniated disc, cervical     c5 and c6 and c6 and c7  . Hypertension   . Kidney failure     stage 4 just dx 4 months ago.Sees Dr Elby Beck, nephrologist in [place] [phone].  .  . 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 Diabetic retinopathy of both eyes, associated with type 2 diabetes mellitus   . Osteomyelitis of right foot 06/17/2015    S/p debridement, Dr. [name]  . Diabetic foot infection 06/16/2015    S/p debridement and wound VAC  . Sepsis    Scheduled Meds: . ALPRAZolam  0.5 mg Oral TID  . ceFTAROline (TEFLARO) IV  300 mg Intravenous Q12H  . chlorhexidine   Topical Once  . cyclobenzaprine  10 mg Oral BID  . fluticasone  1 spray Each Nare Daily  . insulin aspart  0-15 Units Subcutaneous TID WC  . insulin aspart  0-5 Units Subcutaneous QHS  . insulin glargine  22 Units Subcutaneous QHS  . iron polysaccharides  150 mg Oral BID  . metoprolol tartrate  50 mg Oral BID  . pantoprazole  80 mg Oral Daily  . sodium bicarbonate  650 mg Oral TID  . sodium chloride  3 mL Intravenous Q12H  . Vilazodone HCl  20 mg Oral QHS   Continuous Infusions: . sodium chloride 75 mL/hr at 07/11/15 1201   PRN Meds:.oxycodone  Allergies  Allergen Reactions  . Daptomycin     Hypotension   . Bactrim [Sulfamethoxazole-Trimethoprim] Other (See Comments)    Cannot  take due to kidney issue  . Dapsone Nausea And Vomiting  . Latex Other (See Comments)    Blisters.   . Penicillins Hives  . Vancomycin Itching   Past Surgical History  Procedure Laterality Date  . Wrist arthroscopy      left with no break reapir of tendond and ligamants  . Toe amputation      5th digit right foot-[place]  . Debridement leg      lower leg-left  . Metatarsal head excision Right 02/14/2013    Procedure: 4TH METATARSAL HEAD RESECTION RIGHT FOOT;  Surgeon: Dallas Schimke, DPM;  Location: [facility];  Service: Orthopedics;  Laterality: Right;  latex allergy  . Vitrectomy Right   . Application of wound vac Right 06/19/2015    Procedure: APPLICATION OF WOUND VAC;  Surgeon: [name], DPM;  Location: [facility];  Service: Podiatry;  Laterality: Right;  . Irrigation and debridement foot Right 06/19/2015    Procedure: IRRIGATION AND DEBRIDEMENT FOOT ;  Surgeon: [name], DPM;  Location: [facility];  Service: Podiatry;  Laterality: Right;   Family History  Problem Relation Age of Onset  . Cancer Mother  liver lung  . Cancer Father     liver  . Diabetes Mother   . Diabetes Father    Social History:  reports that he quit smoking about 3 weeks ago. His smoking use included Cigarettes. He has a 5 pack-year smoking history. His smokeless tobacco use includes Chew. He reports that he drinks alcohol. He reports that he does not use illicit drugs.  Physical Examination: Vital signs in last 24 hours:   Temp:  [98 F (36.7 C)-99.5 F (37.5 C)] 98.7 F (37.1 C) (08/13 0617) Pulse Rate:  [67-91] 78 (08/13 0617) Resp:  [16-18] 16 (08/13 0617) BP: (130-163)/(71-81) 130/77 mmHg (08/13 0617) SpO2:  [97 %-99 %] 97 % (08/13 0617)  DRESSING:  Wound VAC at 125 mmHg of continous pressure. INTEGUMENT:  There is a full thickness wound along the plantar lateral aspect of the right foot.  The wound is granular.  No necrotic tissue is present.  No malodor is  present. VASCULAR:  Pedal pulses are palpable bilaterally.  There is edema of the right foot. NEUROLOGIC:  Protective sensation is diminished bilaterally.  Lab/Test Results:    Recent Labs  07/10/15 0359 07/11/15 0613  WBC 13.2* 13.5*  HGB 7.6* 7.9*  HCT 22.3* 23.8*  PLT 312 316  NA 133* 133*  K 3.7 3.8  CL 104 105  CO2 21* 21*  BUN 39* 35*  CREATININE 4.58* 4.41*  GLUCOSE 167* 165*  CALCIUM 7.8* 7.6*    Recent Results (from the past 240 hour(s))  Culture, blood (x 2)     Status: None (Preliminary result)   Collection Time: 07/08/15 10:57 AM  Result Value Ref Range Status   Specimen Description BLOOD LEFT ANTECUBITAL  Final   Special Requests   Final    BOTTLES DRAWN AEROBIC AND ANAEROBIC AEB=10CC ANA=6CC   Culture NO GROWTH 3 DAYS  Final   Report Status PENDING  Incomplete  Culture, blood (x 2)     Status: None (Preliminary result)   Collection Time: 07/08/15 11:10 AM  Result Value Ref Range Status   Specimen Description BLOOD LEFT ARM  Final   Special Requests   Final    BOTTLES DRAWN AEROBIC AND ANAEROBIC AEB=8CC ANA=5CC   Culture NO GROWTH 3 DAYS  Final   Report Status PENDING  Incomplete  Surgical pcr screen     Status: None   Collection Time: 07/09/15  9:15 PM  Result Value Ref Range Status   MRSA, PCR NEGATIVE NEGATIVE Final   Staphylococcus aureus NEGATIVE NEGATIVE Final    Comment:        The Xpert SA Assay (FDA approved for NASAL specimens in patients over 32 years of age), is one component of a comprehensive surveillance program.  Test performance has been validated by [facility] for patients greater than or equal to 35 year old. It is not intended to diagnose infection nor to guide or monitor treatment.   Anaerobic culture     Status: None (Preliminary result)   Collection Time: 07/10/15 10:55 AM  Result Value Ref Range Status   Specimen Description TOE RIGHT FIFTH  Final   Special Requests NONE  Final   Gram Stain PENDING  Incomplete    Culture   Final    NO ANAEROBES ISOLATED; CULTURE IN PROGRESS FOR 5 DAYS Performed at [facility]    Report Status PENDING  Incomplete     Dg Foot Complete Right  07/10/2015   CLINICAL DATA:  Interval fifth metatarsal resection.  EXAM: RIGHT FOOT COMPLETE - 3+ VIEW  COMPARISON:  07/09/2015  FINDINGS: There is evidence of prior fourth metatarsal head resection. There is interval resection of the base of the fifth metatarsal. There appears to be debridement along the lateral margin at the base of the right fourth metatarsal. There a wound-vac at the surgical site.  There is no acute fracture or dislocation. 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  There are no radiopaque foreign bodies.  IMPRESSION: There is evidence of prior fourth metatarsal head resection. There is interval resection of the base of the fifth metatarsal. There appears to be debridement along the lateral margin at the base of the right fourth metatarsal. There a wound-vac at the surgical site.   Electronically Signed   By: Elige Ko   On: 07/10/2015 12:04   Dg Foot Complete Right  07/10/2015   CLINICAL DATA:  Right foot infection. Preoperative radiograph. Subsequent encounter.  EXAM: RIGHT FOOT COMPLETE - 3+ VIEW  COMPARISON:  Right foot radiographs performed 07/06/2015, and right foot MRI performed 06/16/2015  FINDINGS: Previously noted osteomyelitis at the lateral aspect of the base of the fourth metatarsal and small remnant of the fifth metatarsal is grossly unchanged in appearance. An overlying wound VAC is noted, with associated soft tissue defect.  Slightly increased lucency with regard to the fourth proximal phalanx is thought to be artifactual in nature. The patient is status post remote fourth metatarsal head resection. Visualized joint spaces are grossly preserved.  IMPRESSION: Osteomyelitis at the lateral aspect of the base of the fourth metatarsal and small remnant of the fifth metatarsal, first  noted on prior MRI, is grossly unchanged.   Electronically Signed   By: Roanna Raider M.D.   On: 07/10/2015 00:45    Assessment: S/P debridement of the right foot for treatment of: - Osteomyelitis of the residual fifth metatarsal, right foot. - Concern for osteomyelitis of the fourth metatarsal, right foot. - Surgical wound of the right foot following debridement of ulcer (skin, subcutaneous tissue, muscle and bone), right foot for treatment of osteomyelitis of the fifth metatarsal, right foot.  Plan: Okay for discharge from my standpoint.  The patient is opposed to taking Zyvox because of "potential for serotonin syndrome."  I will follow his culture results from surgery.  For now, clindamycin x 2 weeks.  He is to remain NWB on his right foot.  The Wound VAC is to remain in place at 125 mmHg of continuous pressure.  He is to follow-up with me at my [place] office on 07/13/2015 at 4:00 PM.  [facility] [place] for 07/16/2015 at 1:00 PM.  Thank you for your assistance with Mr. Lehenbauer.   Babatunde Seago IVAN 07/11/2015, 12:43 PM

## 2015-07-11 NOTE — Progress Notes (Addendum)
1405 d/c instructions, hard Rxs and paperwork given to patient and patient's wife. Patient aware to f/u with Dr.McKinney on his scheduled apt in his [place] office. Wound vac noted intact & in place running @ suction as ordered. IV catheter removed from LEFT forearm, catheter intact, no s/s of infection noted, no c/o pain or discomfort noted. Patient assisted to vehicle via w/c by staff, wife to transport home.

## 2015-07-11 NOTE — Discharge Summary (Addendum)
Physician Discharge Summary  Maurice [name]:[id number] DOB: 01-03-1976 DOA: 07/08/2015  PCP: PROVIDER NOT IN SYSTEM  Admit date: 07/08/2015 Discharge date: 07/11/2015  Time spent: 40 minutes  Recommendations for Outpatient Follow-up:  1. Patient will be referred to outpatient wound care for hyperbaric therapy 2. Follow up with Podiatry as scheduled 3. Follow up with primary nephrologist as scheduled 4. Repeat CBC and BMP in one week    Discharge Diagnoses:  Principal Problem:   Sepsis Active Problems:   Diabetic foot infection   Acute renal failure superimposed on stage 4 chronic kidney disease possibly related to a combination of prerenal syndrome versus ATN   Anemia, chronic disease   Diabetes mellitus with complication   Essential hypertension   Depression with anxiety   GERD (gastroesophageal reflux disease)   Hyponatremia   Osteomyelitis of right foot   Sleep apnea   Discharge Condition: Improved  Diet recommendation: Heart Healthy  Filed Weights   07/08/15 0735  Weight: 107.956 kg (238 lb)    History of present illness:  Maurice [name] is a 39 y.o. male with a past medical history significant for chronic kidney disease stage IV, hypertension, anemia, osteomyelitis, recent bacteremia and chronic ongoing right foot infection status post resection of fourth and fifth metatarsals status post procedure 3 weeks ago I podiatry is admitted from perioperative area with sepsis. He was scheduled to have further debridement and was found to be septic.  Patient reports he has been on clindamycin and Cipro for the last 3 weeks since his last hospitalization. Today he was scheduled for more extensive debridement seizure was canceled due to his fever. He reports having intermittent low-grade fevers over the last several days. He would take Tylenol and they would resolve. Associated symptoms with that included intermittent nausea no vomiting 3 episodes of diarrhea  Hospital Course:   The patient was admitted for sepsis which was likely related to ongoing infection in right foot in the setting of recent bacteremia. Debridement of skin, subcutaneous tissue, muscle and bone, right foot on 8/12. Lactic acid levels returned within limits of normal. IVF were given and blood cultures showed no growth during their hospital stay. Chest x-ray was completed and was unremarkable. Urinalysis was unremarkable. Patient is afebrile and appears to be doing clinically well.  He will be transitioned to orally antibiotics. He has several antibiotics allergies/intolerance. initially zyvox was recommended but patient was told that zyvox was contraindicated with his SSRIs.  He reports tolerating clindamycin in the past.  Case discussed with Dr. [name], who agrees with continuing clindamycin as an outpatient.  He will be referred to an outpatient wound care center for hyperbaric therapy.  Unfortunately there is a high chance he may end up needing an amputation.    1. ARF superimposed on stage 4 CKD. Chart review indicated his baseline was 3-4. Patient was started on IV hydration with improvement in renal function. His creatinine is back to baseline at discharge.Nephrology was consulted and felt his renal failure was related to a combination of prerenal syndrome versus ATN. He will follow up with his primary nephrologist.  2. Chronic anemia. Was transfused 3 units PRBC's packed RBC's since admission. Started on Nu-iron per nephrology. No evidence of bleeding. Would consider starting procrit as an outpatient per his primary nephrologist. Continue on oral iron supplementation   3. Diabetic foot infection.Podiatrist preformed surgery on 8/12.   4. DM with complications. Home medications include Lantus, NovoLog. CBG range 167-177. He was receiving a significantly  lower dose of lantus in the hospital, but reports that his caloric intake at home is much higher and wishes to continue his home dose on discharge.   5. HTN. Stable, BB was continued and Lasix and ACE I were held. Continue to hold ACE I and Lasix until follow up with nephrology.  6. Depression with anxiety. Remained at baseline during hospital stay. 7. GERD. Stable, baseline. Continue home medications 8. Hyponatremia. Was replenished through IVF. Likely related to volume depletion.   Procedures: Debridement of skin, subcutaneous tissue, muscle and bone, right foot.  Application of [employer] Wound VAC, right foot.  Consultations: Podiatry Nephrology  Discharge Exam: Filed Vitals:   07/11/15 0617  BP: 130/77  Pulse: 78  Temp: 98.7 F (37.1 C)  Resp: 16    General: NAD, appears calm and comfortable, sitting up in bed Cardiovascular: RRR, no m/r/g, S1, S2,  Respiratory: CTAB, no w/r/r, no work of breathing   Discharge Instructions  Discharge Instructions    Diet - low sodium heart healthy    Complete by:  As directed      Increase activity slowly    Complete by:  As directed           Current Discharge Medication List    START taking these medications   Details  iron polysaccharides (NIFEREX) 150 MG capsule Take 1 capsule (150 mg total) by mouth 2 (two) times daily. Qty: 60 capsule, Refills: 1      CONTINUE these medications which have CHANGED   Details  clindamycin (CLEOCIN) 300 MG capsule Take 1 capsule (300 mg total) by mouth 3 (three) times daily. Antibiotic to be taken for 4 more weeks. Qty: 42 capsule, Refills: 0    furosemide (LASIX) 20 MG tablet Take 1 tablet (20 mg total) by mouth daily as needed for fluid. Qty: 30 tablet      CONTINUE these medications which have NOT CHANGED   Details  ALPRAZolam (XANAX) 0.5 MG tablet Take 0.5 mg by mouth 3 (three) times daily.    cyclobenzaprine (FLEXERIL) 10 MG tablet Take 10 mg by mouth 2 (two) times daily.    insulin glargine (LANTUS) 100 UNIT/ML injection Inject 42 Units into the skin at bedtime.     metoprolol (LOPRESSOR) 50 MG tablet Take 50 mg by mouth  2 (two) times daily.    mometasone (NASONEX) 50 MCG/ACT nasal spray Place 2 sprays into the nose daily as needed (for allergies).     NOVOLOG FLEXPEN 100 UNIT/ML FlexPen Inject 0-75 Units into the skin 3 (three) times daily after meals.  Refills: 1    omega-3 acid ethyl esters (LOVAZA) 1 G capsule Take 1,000 mg by mouth 2 (two) times daily. Refills: 11    omeprazole (PRILOSEC) 40 MG capsule Take 40 mg by mouth daily.    oxycodone (ROXICODONE) 30 MG immediate release tablet Take 30 mg by mouth 3 (three) times daily.     Vilazodone HCl (VIIBRYD) 20 MG TABS Take 20 mg by mouth daily.      STOP taking these medications     ciprofloxacin (CIPRO) 500 MG tablet      lisinopril (PRINIVIL,ZESTRIL) 20 MG tablet        Allergies  Allergen Reactions  . Daptomycin     Hypotension   . Bactrim [Sulfamethoxazole-Trimethoprim] Other (See Comments)    Cannot take due to kidney issue  . Dapsone Nausea And Vomiting  . Latex Other (See Comments)    Blisters.   . Penicillins Hives  .  Vancomycin Itching   Follow-up Information    Follow up with [facility].   Contact information:   [address] [place] [address] [phone]        The results of significant diagnostics from this hospitalization (including imaging, microbiology, ancillary and laboratory) are listed below for reference.    Significant Diagnostic Studies: US Renal  06/17/2015   CLINICAL DATA:  ATN. Chronic kidney disease stage 4 or 5. Diabetes.  EXAM: RENAL / URINARY TRACT ULTRASOUND COMPLETE  COMPARISON:  None.  FINDINGS: Right Kidney:  Length: 13.7 cm. Echogenicity within normal limits. No mass or hydronephrosis visualized.  Left Kidney:  Length: 14.2 cm. Echogenicity within normal limits. No mass or hydronephrosis visualized.  Bladder:  Appears normal for degree of bladder distention. Ureteral jets not visualized.  IMPRESSION: Normal size kidneys without evidence of hydronephrosis.    Electronically Signed   By: Elberta Fortis M.D.   On: 06/17/2015 16:23   Mr Foot Right Wo Contrast  06/17/2015   CLINICAL DATA:  Right foot pain and swelling.  Abnormal x-ray.  Hip  EXAM: MRI OF THE RIGHT FOREFOOT WITHOUT CONTRAST  TECHNIQUE: Multiplanar, multisequence MR imaging was performed. No intravenous contrast was administered.  COMPARISON:  X-ray right foot 06/16/2015  FINDINGS: There is significant patient motion degrading image quality limiting evaluation.  There is evidence of fourth and fifth partial metatarsal amputation. There is soft tissue ulceration along the lateral aspect of the left foot overlying the base of the fifth metatarsal. There is soft tissue edema along the lateral aspect of the foot. There is marrow edema within the fifth metatarsal stump with a small adjacent 8 mm fluid collection.  There is no other marrow signal abnormality. 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 There is moderate osteoarthritis of the first MTP joint. There is no acute fracture or dislocation. There is mild T2 signal within the plantar musculature likely neurogenic. There is no other fluid collection or hematoma.  IMPRESSION: Soft tissue ulceration and edema of the lateral aspect of the right foot most concerning for cellulitis. There is marrow edema within the fifth metatarsal stump with a small 8 mm fluid collection adjacent to the lateral cortex. The appearance is most concerning for osteomyelitis.   Electronically Signed   By: Elige Ko   On: 06/17/2015 08:13   Dg Chest Port 1 View  07/08/2015   CLINICAL DATA:  Sepsis.  EXAM: PORTABLE CHEST - 1 VIEW  COMPARISON:  None.  FINDINGS: Both lungs are clear. Heart and mediastinum are within normal limits. The trachea is midline. Negative for a pneumothorax. No acute bone abnormality.  IMPRESSION: No acute chest abnormality.   Electronically Signed   By: Richarda Overlie M.D.   On: 07/08/2015 11:23   Dg Foot Complete Right  07/10/2015   CLINICAL  DATA:  Interval fifth metatarsal resection.  EXAM: RIGHT FOOT COMPLETE - 3+ VIEW  COMPARISON:  07/09/2015  FINDINGS: There is evidence of prior fourth metatarsal head resection. There is interval resection of the base of the fifth metatarsal. There appears to be debridement along the lateral margin at the base of the right fourth metatarsal. There a wound-vac at the surgical site.  There is no acute fracture or dislocation. 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  There are no radiopaque foreign bodies.  IMPRESSION: There is evidence of prior fourth metatarsal head resection. There is interval resection of the base of the fifth metatarsal. There  appears to be debridement along the lateral margin at the base of the right fourth metatarsal. There a wound-vac at the surgical site.   Electronically Signed   By: Elige Ko   On: 07/10/2015 12:04   Dg Foot Complete Right  07/10/2015   CLINICAL DATA:  Right foot infection. Preoperative radiograph. Subsequent encounter.  EXAM: RIGHT FOOT COMPLETE - 3+ VIEW  COMPARISON:  Right foot radiographs performed 07/06/2015, and right foot MRI performed 06/16/2015  FINDINGS: Previously noted osteomyelitis at the lateral aspect of the base of the fourth metatarsal and small remnant of the fifth metatarsal is grossly unchanged in appearance. An overlying wound VAC is noted, with associated soft tissue defect.  Slightly increased lucency with regard to the fourth proximal phalanx is thought to be artifactual in nature. The patient is status post remote fourth metatarsal head resection. Visualized joint spaces are grossly preserved.  IMPRESSION: Osteomyelitis at the lateral aspect of the base of the fourth metatarsal and small remnant of the fifth metatarsal, first noted on prior MRI, is grossly unchanged.   Electronically Signed   By: Roanna Raider M.D.   On: 07/10/2015 00:45   Dg Foot Complete Right  07/06/2015   CLINICAL DATA:  Nonhealing ulcer of the right  foot.  EXAM: RIGHT FOOT COMPLETE - 3+ VIEW  COMPARISON:  06/19/2015 and MRI dated 06/16/2015  FINDINGS: The patient has developed new fragmentation of remnant of the base of the fifth metatarsal. There is also new destruction of the lateral cortex of the base of the stump of the fourth metatarsal.  Chronic changes in the great toe and first metatarsal phalangeal joint are noted. Previous amputation of the distal fourth metatarsal and of the majority of fifth metatarsal and fifth toe.  IMPRESSION: Osteomyelitis of the remnant of the base of the fifth metatarsal and of the lateral aspect of the base of the fourth metatarsal.   Electronically Signed   By: Francene Boyers M.D.   On: 07/06/2015 14:57   Dg Foot Complete Right  06/19/2015   CLINICAL DATA:  Postoperative debridement.  EXAM: RIGHT FOOT COMPLETE - 3+ VIEW  COMPARISON:  June 16, 2015.  FINDINGS: Status post old surgical resection of distal fourth metatarsal is noted. There is been interval surgical resection of the residual portion of the fifth metatarsal. Large soft tissue defect is seen laterally in the right midfoot consistent with surgery. Wound VAC is seen over this site. Degenerative changes seen involving the first metatarsophalangeal and interphalangeal joints.  IMPRESSION: Status post surgical resection of residual portion of fifth metatarsal with wound VAC over this site. No other areas of lytic destruction are seen to suggest osteomyelitis.   Electronically Signed   By: Lupita Raider, M.D.   On: 06/19/2015 11:27   Dg Foot Complete Right  06/16/2015   CLINICAL DATA:  Diabetic presenting with right foot swelling and a large blister on the lateral side of the right foot.  EXAM: RIGHT FOOT COMPLETE - 3+ VIEW  COMPARISON:  06/15/2015 and earlier.  FINDINGS: Prior amputation of the 5th ray at the level of the proximal 5th metatarsal. Since yesterday's examination, development of a small amount of gas in the lateral soft tissues overlying the  amputated 5th metatarsal. No evidence of underlying osteomyelitis involving the remaining 5th metatarsal. Prior amputation of the head of the 4th metatarsal. No evidence of acute fracture. Severe joint space narrowing and associated hypertrophic spurring involving the IP joint of the great toe. Remaining joint spaces  well preserved. No erosions. Mild osseous demineralization.  IMPRESSION: 1. Soft tissue infection involving the lateral foot with gas in the lateral soft tissues which was not visible yesterday. 2. No evidence of osteomyelitis involving the underlying remaining 5th metatarsal or elsewhere. 3. 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   Electronically Signed   By: Hulan Saas M.D.   On: 06/16/2015 16:10   Dg Foot Complete Right  06/15/2015   CLINICAL DATA:  Diabetic foot ulcer  EXAM: RIGHT FOOT COMPLETE - 3+ VIEW  COMPARISON:  Right foot dated February 14, 2013  FINDINGS: The patient has undergone previous amputation of the distal aspect of the fourth metatarsal. The fourth phalanx remains present. The patient has undergone amputation of the fifth toe as well as the distal half of the fifth metatarsal. There is chronic cortical thickening of the proximal phalanx of the great toe. There are interphalangeal joint osteoarthritic changes diffusely greatest at the IP joint of the great toe. There is mild osteoarthritic change of the first MTP joint. There are no bony changes to suggest osteomyelitis of the calcaneus. Due to the weight-bearing nature of the lateral film the extreme inferior or superficial aspect of the heel pad is excluded from the study.  IMPRESSION: There are extensive chronic changes involving the fourth metatarsal and there are post amputation changes of the fifth toe and distal portion of the fifth metatarsal. No definite changes of osteomyelitis of the calcaneus are observed.   Electronically Signed   By: David  [place] M.D.   On: 06/15/2015 14:01     Microbiology: Recent Results (from the past 240 hour(s))  Culture, blood (x 2)     Status: None (Preliminary result)   Collection Time: 07/08/15 10:57 AM  Result Value Ref Range Status   Specimen Description BLOOD LEFT ANTECUBITAL  Final   Special Requests   Final    BOTTLES DRAWN AEROBIC AND ANAEROBIC AEB=10CC ANA=6CC   Culture NO GROWTH 3 DAYS  Final   Report Status PENDING  Incomplete  Culture, blood (x 2)     Status: None (Preliminary result)   Collection Time: 07/08/15 11:10 AM  Result Value Ref Range Status   Specimen Description BLOOD LEFT ARM  Final   Special Requests   Final    BOTTLES DRAWN AEROBIC AND ANAEROBIC AEB=8CC ANA=5CC   Culture NO GROWTH 3 DAYS  Final   Report Status PENDING  Incomplete  Surgical pcr screen     Status: None   Collection Time: 07/09/15  9:15 PM  Result Value Ref Range Status   MRSA, PCR NEGATIVE NEGATIVE Final   Staphylococcus aureus NEGATIVE NEGATIVE Final    Comment:        The Xpert SA Assay (FDA approved for NASAL specimens in patients over 20 years of age), is one component of a comprehensive surveillance program.  Test performance has been validated by [facility] for patients greater than or equal to 63 year old. It is not intended to diagnose infection nor to guide or monitor treatment.   Anaerobic culture     Status: None (Preliminary result)   Collection Time: 07/10/15 10:55 AM  Result Value Ref Range Status   Specimen Description TOE RIGHT FIFTH  Final   Special Requests NONE  Final   Gram Stain PENDING  Incomplete   Culture   Final    NO ANAEROBES ISOLATED; CULTURE IN PROGRESS FOR 5 DAYS Performed at [facility]    Report Status PENDING  Incomplete     Labs: Basic Metabolic Panel:  Recent Labs Lab 07/06/15 1325 07/08/15 1057 07/09/15 0559 07/10/15 0359 07/11/15 0613  NA 129* 129* 132* 133* 133*  K 4.9 4.1 3.9 3.7 3.8  CL 95* 97* 104 104 105  CO2 23 20* 21* 21* 21*  GLUCOSE 287* 335* 121* 167*  165*  BUN 67* 59* 46* 39* 35*  CREATININE 6.29* 5.33* 4.51* 4.58* 4.41*  CALCIUM 7.9* 7.6* 7.6* 7.8* 7.6*  MG  --  1.7  --   --   --   PHOS  --  5.6*  --   --   --    Liver Function Tests:  Recent Labs Lab 07/06/15 1325 07/08/15 1057 07/09/15 0559  AST 12* 13* 10*  ALT 15* 10* 11*  ALKPHOS 116 99 88  BILITOT 0.2* 0.3 0.5  PROT 6.9 7.0 6.6  ALBUMIN 2.0* 1.7* 1.6*   CBC:  Recent Labs Lab 07/06/15 1325 07/08/15 1057 07/09/15 0559 07/09/15 2155 07/10/15 0359 07/11/15 0613  WBC 11.9* 14.0* 13.0* 14.6* 13.2* 13.5*  NEUTROABS 7.6 10.8*  --   --   --   --   HGB 8.1* 6.7* 8.5* 8.2* 7.6* 7.9*  HCT 24.2* 19.9* 25.0* 24.1* 22.3* 23.8*  MCV 83.7 83.6 83.9 83.7 84.2 84.4  PLT 347 365 347 326 312 316    CBG:  Recent Labs Lab 07/10/15 1210 07/10/15 1631 07/10/15 2218 07/11/15 0803 07/11/15 1130  GLUCAP 177* 187* 237* 149* 195*     Signed:  Erick Blinks, M.D.  [facility] 07/11/2015, 1:27 PM  I, Dawayne Cirri, acting a scribe, recorded this note contemporaneously in the presence of Dr. Erick Blinks, M.D. On 07/11/2015 at 1:27 PM  I have reviewed the above documentation for accuracy and completeness, and I agree with the above.  Miryam Mcelhinney

## 2015-07-11 NOTE — Progress Notes (Signed)
Subjective: Patient feels good and denies any difficulty breathing.  Objective: Vital signs in last 24 hours: Temp:  [97.6 F (36.4 C)-99.5 F (37.5 C)] 98.7 F (37.1 C) (08/13 0617) Pulse Rate:  [67-100] 78 (08/13 0617) Resp:  [11-23] 16 (08/13 0617) BP: (127-178)/(71-90) 130/77 mmHg (08/13 0617) SpO2:  [95 %-100 %] 97 % (08/13 0617)  Intake/Output from previous day: 08/12 0701 - 08/13 0700 In: 5084.7 [P.O.:480; I.V.:4368; Blood:236.7] Out: 50 [Blood:50] Intake/Output this shift:     Recent Labs  07/08/15 1057 07/09/15 0559 07/09/15 2155 07/10/15 0359 07/11/15 0613  HGB 6.7* 8.5* 8.2* 7.6* 7.9*    Recent Labs  07/10/15 0359 07/11/15 0613  WBC 13.2* 13.5*  RBC 2.65* 2.82*  HCT 22.3* 23.8*  PLT 312 316    Recent Labs  07/10/15 0359 07/11/15 0613  NA 133* 133*  K 3.7 3.8  CL 104 105  CO2 21* 21*  BUN 39* 35*  CREATININE 4.58* 4.41*  GLUCOSE 167* 165*  CALCIUM 7.8* 7.6*    Recent Labs  07/08/15 1057  INR 1.24    Generally patient is alert and in no apparent distress. Chest is clear to auscultation Heart examination regular rate and rhythm Extremities no edema  Assessment/Plan: Problem #1 acute kidney injury: Possibly a compression of prerenal syndrome versus ATN. Presently his BUN and creatinine continue to improve. His potassium is normal and no sign of fluid overload Problem #2 chronic renal failure: Thought to be secondary to diabetes/hypertension. Is stage IV. Problem #3 diabetes Problem #4 hypertension: His blood pressure is reasonably controlled Problem #5 diabetic foot infection: Status post surgicla debridement and on wound VAC Problem #6 anemia: His hemoglobin is low but stable. Patient is on  Iron suppelement Problem #7 metabolic bone disease:  Problem #8 Low CO2: Presently he is on sodium bicarbonate and CO2 is stable. Plan: 1] we'll continue his present management 2] we'll check his basic metabolic panel in the  morning.   Aron Inge S 07/11/2015, 8:36 AM

## 2015-07-12 LAB — TYPE AND SCREEN
ABO/RH(D): O NEG
Antibody Screen: NEGATIVE
UNIT DIVISION: 0
UNIT DIVISION: 0
UNIT DIVISION: 0
Unit division: 0

## 2015-07-13 LAB — CULTURE, BLOOD (ROUTINE X 2)
Culture: NO GROWTH
Culture: NO GROWTH

## 2015-07-13 LAB — WOUND CULTURE: Gram Stain: NONE SEEN

## 2015-07-14 ENCOUNTER — Encounter (HOSPITAL_COMMUNITY): Payer: Self-pay | Admitting: Podiatry

## 2015-07-16 LAB — ANAEROBIC CULTURE: Gram Stain: NONE SEEN

## 2015-08-05 ENCOUNTER — Inpatient Hospital Stay (HOSPITAL_COMMUNITY)
Admission: EM | Admit: 2015-08-05 | Discharge: 2015-08-14 | DRG: 474 | Disposition: A | Discharge status: Rehabilitation Facility | Payer: Medicare Other | Attending: Family Medicine | Admitting: Family Medicine

## 2015-08-05 ENCOUNTER — Emergency Department (HOSPITAL_COMMUNITY): Payer: Medicare Other

## 2015-08-05 ENCOUNTER — Encounter (HOSPITAL_COMMUNITY): Payer: Self-pay | Admitting: Emergency Medicine

## 2015-08-05 DIAGNOSIS — L97519 Non-pressure chronic ulcer of other part of right foot with unspecified severity: Secondary | ICD-10-CM | POA: Diagnosis present

## 2015-08-05 DIAGNOSIS — Z809 Family history of malignant neoplasm, unspecified: Secondary | ICD-10-CM

## 2015-08-05 DIAGNOSIS — D509 Iron deficiency anemia, unspecified: Secondary | ICD-10-CM | POA: Diagnosis present

## 2015-08-05 DIAGNOSIS — M86171 Other acute osteomyelitis, right ankle and foot: Secondary | ICD-10-CM

## 2015-08-05 DIAGNOSIS — E10319 Type 1 diabetes mellitus with unspecified diabetic retinopathy without macular edema: Secondary | ICD-10-CM | POA: Diagnosis present

## 2015-08-05 DIAGNOSIS — E11628 Type 2 diabetes mellitus with other skin complications: Secondary | ICD-10-CM | POA: Diagnosis present

## 2015-08-05 DIAGNOSIS — Z881 Allergy status to other antibiotic agents status: Secondary | ICD-10-CM

## 2015-08-05 DIAGNOSIS — E876 Hypokalemia: Secondary | ICD-10-CM | POA: Diagnosis present

## 2015-08-05 DIAGNOSIS — D631 Anemia in chronic kidney disease: Secondary | ICD-10-CM | POA: Diagnosis present

## 2015-08-05 DIAGNOSIS — E1165 Type 2 diabetes mellitus with hyperglycemia: Secondary | ICD-10-CM

## 2015-08-05 DIAGNOSIS — D638 Anemia in other chronic diseases classified elsewhere: Secondary | ICD-10-CM | POA: Diagnosis present

## 2015-08-05 DIAGNOSIS — E1042 Type 1 diabetes mellitus with diabetic polyneuropathy: Secondary | ICD-10-CM | POA: Diagnosis present

## 2015-08-05 DIAGNOSIS — E10628 Type 1 diabetes mellitus with other skin complications: Secondary | ICD-10-CM | POA: Diagnosis present

## 2015-08-05 DIAGNOSIS — N179 Acute kidney failure, unspecified: Secondary | ICD-10-CM | POA: Diagnosis present

## 2015-08-05 DIAGNOSIS — M1990 Unspecified osteoarthritis, unspecified site: Secondary | ICD-10-CM | POA: Diagnosis present

## 2015-08-05 DIAGNOSIS — I129 Hypertensive chronic kidney disease with stage 1 through stage 4 chronic kidney disease, or unspecified chronic kidney disease: Secondary | ICD-10-CM | POA: Diagnosis present

## 2015-08-05 DIAGNOSIS — N170 Acute kidney failure with tubular necrosis: Secondary | ICD-10-CM | POA: Diagnosis present

## 2015-08-05 DIAGNOSIS — I10 Essential (primary) hypertension: Secondary | ICD-10-CM | POA: Diagnosis present

## 2015-08-05 DIAGNOSIS — M869 Osteomyelitis, unspecified: Secondary | ICD-10-CM | POA: Diagnosis not present

## 2015-08-05 DIAGNOSIS — E10621 Type 1 diabetes mellitus with foot ulcer: Secondary | ICD-10-CM | POA: Diagnosis present

## 2015-08-05 DIAGNOSIS — D72829 Elevated white blood cell count, unspecified: Secondary | ICD-10-CM | POA: Diagnosis present

## 2015-08-05 DIAGNOSIS — Z87891 Personal history of nicotine dependence: Secondary | ICD-10-CM

## 2015-08-05 DIAGNOSIS — E11621 Type 2 diabetes mellitus with foot ulcer: Secondary | ICD-10-CM | POA: Diagnosis present

## 2015-08-05 DIAGNOSIS — Z833 Family history of diabetes mellitus: Secondary | ICD-10-CM

## 2015-08-05 DIAGNOSIS — L03115 Cellulitis of right lower limb: Secondary | ICD-10-CM | POA: Diagnosis present

## 2015-08-05 DIAGNOSIS — E871 Hypo-osmolality and hyponatremia: Secondary | ICD-10-CM | POA: Diagnosis present

## 2015-08-05 DIAGNOSIS — E1122 Type 2 diabetes mellitus with diabetic chronic kidney disease: Secondary | ICD-10-CM | POA: Diagnosis present

## 2015-08-05 DIAGNOSIS — N186 End stage renal disease: Secondary | ICD-10-CM

## 2015-08-05 DIAGNOSIS — E861 Hypovolemia: Secondary | ICD-10-CM | POA: Diagnosis present

## 2015-08-05 DIAGNOSIS — N184 Chronic kidney disease, stage 4 (severe): Secondary | ICD-10-CM | POA: Diagnosis present

## 2015-08-05 DIAGNOSIS — L089 Local infection of the skin and subcutaneous tissue, unspecified: Secondary | ICD-10-CM

## 2015-08-05 DIAGNOSIS — E1022 Type 1 diabetes mellitus with diabetic chronic kidney disease: Secondary | ICD-10-CM | POA: Diagnosis present

## 2015-08-05 DIAGNOSIS — K219 Gastro-esophageal reflux disease without esophagitis: Secondary | ICD-10-CM | POA: Diagnosis present

## 2015-08-05 DIAGNOSIS — Z91040 Latex allergy status: Secondary | ICD-10-CM

## 2015-08-05 DIAGNOSIS — E1065 Type 1 diabetes mellitus with hyperglycemia: Secondary | ICD-10-CM | POA: Diagnosis present

## 2015-08-05 DIAGNOSIS — Z794 Long term (current) use of insulin: Secondary | ICD-10-CM

## 2015-08-05 DIAGNOSIS — IMO0002 Reserved for concepts with insufficient information to code with codable children: Secondary | ICD-10-CM | POA: Diagnosis present

## 2015-08-05 DIAGNOSIS — M79671 Pain in right foot: Secondary | ICD-10-CM | POA: Diagnosis not present

## 2015-08-05 DIAGNOSIS — E1052 Type 1 diabetes mellitus with diabetic peripheral angiopathy with gangrene: Secondary | ICD-10-CM | POA: Diagnosis present

## 2015-08-05 NOTE — ED Notes (Signed)
Patient complaining of worsening symptoms to right foot. States is being seen in wound clinic in [place] for diabetic foot ulcer. Was told to come to ED for evaluation and admission per Dr. [name].

## 2015-08-05 NOTE — ED Provider Notes (Signed)
CSN: [id number]   Arrival date & time 08/05/15 2237  History  This chart was scribed for Geoffery Lyons, MD by Bethel Born, ED Scribe. This patient was seen in room APA04/[facility]04 and the patient's care was started at 11:37 PM.  Chief Complaint  Patient presents with  . Foot Ulcer    HPI The history is provided by the patient. No language interpreter was used.   Maurice [name] is a 39 y.o. male with PMHx of DM  And osteomyelitis of the right food s/p debridement who presents to the Emergency Department complaining of worsening chronic right foot wounds today. [facility] the wound looked worse than usual and he was referred to the ED. Pt states that his usual doctor had been out of town so he was having the wound cared for at home until today. He has been on abx.  Past Medical History  Diagnosis Date  . PVC (premature ventricular contraction)   . PAC (premature atrial contraction)   . Diabetes mellitus without complication   . Anxiety   . Depression   . Blood clot in vein     pt sts" it may have been in artery and not vein" of right leg  . GERD (gastroesophageal reflux disease)   . H/O hiatal hernia   . Barrett's esophagus   . Arthritis   . Neuropathy   . Herniated disc, cervical     c5 and c6 and c6 and c7  . Hypertension   . Kidney failure     stage 4 just dx 4 months ago.Sees Dr Elby Beck, nephrologist in [place] [phone].  .  . 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 Diabetic retinopathy of both eyes, associated with type 2 diabetes mellitus   . Osteomyelitis of right foot 06/17/2015    S/p debridement, Dr. [name]  . Diabetic foot infection 06/16/2015    S/p debridement and wound VAC  . Sepsis     Past Surgical History  Procedure Laterality Date  . Wrist arthroscopy      left with no break reapir of tendond and ligamants  . Toe amputation      5th digit right foot-[place]  . Debridement leg      lower leg-left   . Metatarsal head excision Right 02/14/2013    Procedure: 4TH METATARSAL HEAD RESECTION RIGHT FOOT;  Surgeon: Dallas Schimke, DPM;  Location: [facility];  Service: Orthopedics;  Laterality: Right;  latex allergy  . Vitrectomy Right   . Application of wound vac Right 06/19/2015    Procedure: APPLICATION OF WOUND VAC;  Surgeon: [name], DPM;  Location: [facility];  Service: Podiatry;  Laterality: Right;  . Irrigation and debridement foot Right 06/19/2015    Procedure: IRRIGATION AND DEBRIDEMENT FOOT ;  Surgeon: [name], DPM;  Location: [facility];  Service: Podiatry;  Laterality: Right;  . Wound debridement Right 07/10/2015    Procedure: DEBRIDEMENT WOUND SUBCUTANEOUS TISSUE, MUSCLE, BONE RIGHT FOOT;  Surgeon: [name], DPM;  Location: [facility];  Service: Podiatry;  Laterality: Right;  . Application of wound vac Right 07/10/2015    Procedure: APPLICATION OF WOUND VAC;  Surgeon: [name], DPM;  Location: [facility];  Service: Podiatry;  Laterality: Right;    Family History  Problem Relation Age of Onset  . Cancer Mother     liver lung  . Cancer Father     liver  . Diabetes  Mother   . Diabetes Father     Social History  Substance Use Topics  . Smoking status: Former Smoker -- 0.25 packs/day for 20 years    Types: Cigarettes    Quit date: 06/16/2015  . Smokeless tobacco: Current User    Types: Chew  . Alcohol Use: 0.0 oz/week    0 Standard drinks or equivalent per week     Comment: rarely     Review of Systems 10 Systems reviewed and all are negative for acute change except as noted in the HPI. Home Medications   Prior to Admission medications   Medication Sig Start Date End Date Taking? Authorizing Provider  ALPRAZolam Prudy Feeler) 0.5 MG tablet Take 0.5 mg by mouth 3 (three) times daily.    Historical Provider, MD  clindamycin (CLEOCIN) 300 MG capsule Take 1 capsule (300 mg total) by mouth 3 (three) times daily. Antibiotic to be taken for 4 more weeks. 07/11/15    Erick Blinks, MD  cyclobenzaprine (FLEXERIL) 10 MG tablet Take 10 mg by mouth 2 (two) times daily.    Historical Provider, MD  furosemide (LASIX) 20 MG tablet Take 1 tablet (20 mg total) by mouth daily as needed for fluid. 07/11/15   Erick Blinks, MD  insulin glargine (LANTUS) 100 UNIT/ML injection Inject 42 Units into the skin at bedtime.     Historical Provider, MD  iron polysaccharides (NIFEREX) 150 MG capsule Take 1 capsule (150 mg total) by mouth 2 (two) times daily. 07/11/15   Erick Blinks, MD  metoprolol (LOPRESSOR) 50 MG tablet Take 50 mg by mouth 2 (two) times daily.    Historical Provider, MD  mometasone (NASONEX) 50 MCG/ACT nasal spray Place 2 sprays into the nose daily as needed (for allergies).     Historical Provider, MD  NOVOLOG FLEXPEN 100 UNIT/ML FlexPen Inject 0-75 Units into the skin 3 (three) times daily after meals.  06/02/15   Historical Provider, MD  omega-3 acid ethyl esters (LOVAZA) 1 G capsule Take 1,000 mg by mouth 2 (two) times daily. 05/06/15   Historical Provider, MD  omeprazole (PRILOSEC) 40 MG capsule Take 40 mg by mouth daily.    Historical Provider, MD  oxycodone (ROXICODONE) 30 MG immediate release tablet Take 30 mg by mouth 3 (three) times daily.     Historical Provider, MD  Vilazodone HCl (VIIBRYD) 20 MG TABS Take 20 mg by mouth daily.    Historical Provider, MD    Allergies  Daptomycin; Bactrim; Dapsone; Latex; Penicillins; and Vancomycin  Triage Vitals: BP 123/87 mmHg  Pulse 106  Temp(Src) 98.4 F (36.9 C) (Oral)  Resp 20  Ht 6\' 3"  (1.905 m)  Wt 248 lb (112.492 kg)  BMI 31.00 kg/m2  SpO2 95%  Physical Exam  Constitutional: He is oriented to person, place, and time. He appears well-developed and well-nourished.  HENT:  Head: Normocephalic and atraumatic.  Eyes: EOM are normal.  Neck: Normal range of motion.  Cardiovascular: Normal rate, regular rhythm, normal heart sounds and intact distal pulses.   Pulmonary/Chest: Effort normal and breath  sounds normal. No respiratory distress.  Abdominal: Soft. He exhibits no distension. There is no tenderness.  Musculoskeletal: Normal range of motion.  Right foot has large, deep ulcers to the plantar surface and dorsal foot. The wounds are foul smelling and appear necrotic.  Neurological: He is alert and oriented to person, place, and time.  Skin: Skin is warm and dry.  Psychiatric: He has a normal mood and affect. Judgment normal.  Nursing  note and vitals reviewed.   ED Course  Procedures   DIAGNOSTIC STUDIES: Oxygen Saturation is 95% on RA, normal by my interpretation.    COORDINATION OF CARE: 11:45 PM Discussed treatment plan which includes lab work and right foot XR with pt at bedside and pt agreed to plan.  12:54 AM-Consult complete with Dr. Dr. Lovell Sheehan (Hospitalist). Patient case explained and discussed. Agrees to admit patient for further evaluation and treatment. Call ended at 12:55 AM  Labs Reviewed - No data to display  Imaging Review No results found.     MDM   Final diagnoses:  None     Patient presents with a worsening foot infection. His x-rays reveal osteomyelitis throughout the midfoot along with subcutaneous emphysema consistent with gas-forming organism infection. He was given intravenous clindamycin and the hospitalist has been consult for admission. Dr. Lovell Sheehan has seen and will admit the patient.   I personally performed the services described in this documentation, which was scribed in my presence. The recorded information has been reviewed and is accurate.    Geoffery Lyons, MD 08/06/15 [phone]

## 2015-08-06 ENCOUNTER — Inpatient Hospital Stay (HOSPITAL_COMMUNITY): Payer: Medicare Other

## 2015-08-06 DIAGNOSIS — D72829 Elevated white blood cell count, unspecified: Secondary | ICD-10-CM

## 2015-08-06 DIAGNOSIS — I10 Essential (primary) hypertension: Secondary | ICD-10-CM

## 2015-08-06 DIAGNOSIS — E10621 Type 1 diabetes mellitus with foot ulcer: Secondary | ICD-10-CM | POA: Diagnosis not present

## 2015-08-06 DIAGNOSIS — M869 Osteomyelitis, unspecified: Secondary | ICD-10-CM | POA: Insufficient documentation

## 2015-08-06 DIAGNOSIS — K219 Gastro-esophageal reflux disease without esophagitis: Secondary | ICD-10-CM | POA: Diagnosis not present

## 2015-08-06 DIAGNOSIS — E11621 Type 2 diabetes mellitus with foot ulcer: Secondary | ICD-10-CM

## 2015-08-06 DIAGNOSIS — Z881 Allergy status to other antibiotic agents status: Secondary | ICD-10-CM | POA: Diagnosis not present

## 2015-08-06 DIAGNOSIS — Z833 Family history of diabetes mellitus: Secondary | ICD-10-CM | POA: Diagnosis not present

## 2015-08-06 DIAGNOSIS — E861 Hypovolemia: Secondary | ICD-10-CM | POA: Diagnosis not present

## 2015-08-06 DIAGNOSIS — L97519 Non-pressure chronic ulcer of other part of right foot with unspecified severity: Secondary | ICD-10-CM

## 2015-08-06 DIAGNOSIS — L03115 Cellulitis of right lower limb: Secondary | ICD-10-CM | POA: Diagnosis present

## 2015-08-06 DIAGNOSIS — E1022 Type 1 diabetes mellitus with diabetic chronic kidney disease: Secondary | ICD-10-CM | POA: Diagnosis not present

## 2015-08-06 DIAGNOSIS — D638 Anemia in other chronic diseases classified elsewhere: Secondary | ICD-10-CM | POA: Diagnosis not present

## 2015-08-06 DIAGNOSIS — Z91040 Latex allergy status: Secondary | ICD-10-CM | POA: Diagnosis not present

## 2015-08-06 DIAGNOSIS — D631 Anemia in chronic kidney disease: Secondary | ICD-10-CM | POA: Diagnosis not present

## 2015-08-06 DIAGNOSIS — E1052 Type 1 diabetes mellitus with diabetic peripheral angiopathy with gangrene: Secondary | ICD-10-CM | POA: Diagnosis not present

## 2015-08-06 DIAGNOSIS — E876 Hypokalemia: Secondary | ICD-10-CM | POA: Diagnosis not present

## 2015-08-06 DIAGNOSIS — Z87891 Personal history of nicotine dependence: Secondary | ICD-10-CM | POA: Diagnosis not present

## 2015-08-06 DIAGNOSIS — E1042 Type 1 diabetes mellitus with diabetic polyneuropathy: Secondary | ICD-10-CM | POA: Diagnosis not present

## 2015-08-06 DIAGNOSIS — Z794 Long term (current) use of insulin: Secondary | ICD-10-CM | POA: Diagnosis not present

## 2015-08-06 DIAGNOSIS — E871 Hypo-osmolality and hyponatremia: Secondary | ICD-10-CM | POA: Diagnosis not present

## 2015-08-06 DIAGNOSIS — D509 Iron deficiency anemia, unspecified: Secondary | ICD-10-CM | POA: Diagnosis not present

## 2015-08-06 DIAGNOSIS — E118 Type 2 diabetes mellitus with unspecified complications: Secondary | ICD-10-CM | POA: Diagnosis not present

## 2015-08-06 DIAGNOSIS — E10319 Type 1 diabetes mellitus with unspecified diabetic retinopathy without macular edema: Secondary | ICD-10-CM | POA: Diagnosis not present

## 2015-08-06 DIAGNOSIS — N179 Acute kidney failure, unspecified: Secondary | ICD-10-CM | POA: Diagnosis not present

## 2015-08-06 DIAGNOSIS — M1990 Unspecified osteoarthritis, unspecified site: Secondary | ICD-10-CM | POA: Diagnosis not present

## 2015-08-06 DIAGNOSIS — E10628 Type 1 diabetes mellitus with other skin complications: Secondary | ICD-10-CM | POA: Diagnosis not present

## 2015-08-06 DIAGNOSIS — N170 Acute kidney failure with tubular necrosis: Secondary | ICD-10-CM | POA: Diagnosis present

## 2015-08-06 DIAGNOSIS — M79671 Pain in right foot: Secondary | ICD-10-CM | POA: Diagnosis present

## 2015-08-06 DIAGNOSIS — Z809 Family history of malignant neoplasm, unspecified: Secondary | ICD-10-CM | POA: Diagnosis not present

## 2015-08-06 DIAGNOSIS — I129 Hypertensive chronic kidney disease with stage 1 through stage 4 chronic kidney disease, or unspecified chronic kidney disease: Secondary | ICD-10-CM | POA: Diagnosis not present

## 2015-08-06 DIAGNOSIS — N184 Chronic kidney disease, stage 4 (severe): Secondary | ICD-10-CM | POA: Diagnosis present

## 2015-08-06 DIAGNOSIS — E1065 Type 1 diabetes mellitus with hyperglycemia: Secondary | ICD-10-CM | POA: Diagnosis not present

## 2015-08-06 DIAGNOSIS — E1169 Type 2 diabetes mellitus with other specified complication: Secondary | ICD-10-CM | POA: Diagnosis not present

## 2015-08-06 LAB — CBC
HCT: 20 % — ABNORMAL LOW (ref 39.0–52.0)
HEMOGLOBIN: 6.8 g/dL — AB (ref 13.0–17.0)
MCH: 27.8 pg (ref 26.0–34.0)
MCHC: 34 g/dL (ref 30.0–36.0)
MCV: 81.6 fL (ref 78.0–100.0)
PLATELETS: 360 10*3/uL (ref 150–400)
RBC: 2.45 MIL/uL — AB (ref 4.22–5.81)
RDW: 14.5 % (ref 11.5–15.5)
WBC: 18.3 10*3/uL — ABNORMAL HIGH (ref 4.0–10.5)

## 2015-08-06 LAB — GLUCOSE, CAPILLARY
GLUCOSE-CAPILLARY: 324 mg/dL — AB (ref 65–99)
GLUCOSE-CAPILLARY: 263 mg/dL — AB (ref 65–99)
Glucose-Capillary: 258 mg/dL — ABNORMAL HIGH (ref 65–99)
Glucose-Capillary: 152 mg/dL — ABNORMAL HIGH (ref 65–99)
Glucose-Capillary: 151 mg/dL — ABNORMAL HIGH (ref 65–99)

## 2015-08-06 LAB — CBC WITH DIFFERENTIAL/PLATELET
Basophils Absolute: 0.1 10*3/uL (ref 0.0–0.1)
Basophils Relative: 0 % (ref 0–1)
EOS PCT: 1 % (ref 0–5)
Eosinophils Absolute: 0.1 10*3/uL (ref 0.0–0.7)
HEMATOCRIT: 24.1 % — AB (ref 39.0–52.0)
HEMOGLOBIN: 8.1 g/dL — AB (ref 13.0–17.0)
LYMPHS ABS: 2.1 10*3/uL (ref 0.7–4.0)
LYMPHS PCT: 9 % — AB (ref 12–46)
MCH: 27.6 pg (ref 26.0–34.0)
MCHC: 33.6 g/dL (ref 30.0–36.0)
MCV: 82.3 fL (ref 78.0–100.0)
MONOS PCT: 8 % (ref 3–12)
Monocytes Absolute: 1.9 10*3/uL — ABNORMAL HIGH (ref 0.1–1.0)
Neutro Abs: 19.5 10*3/uL — ABNORMAL HIGH (ref 1.7–7.7)
Neutrophils Relative %: 82 % — ABNORMAL HIGH (ref 43–77)
Platelets: 369 10*3/uL (ref 150–400)
RBC: 2.93 MIL/uL — AB (ref 4.22–5.81)
RDW: 14.5 % (ref 11.5–15.5)
WBC: 23.7 10*3/uL — AB (ref 4.0–10.5)

## 2015-08-06 LAB — BASIC METABOLIC PANEL
ANION GAP: 7 (ref 5–15)
Anion gap: 10 (ref 5–15)
BUN: 59 mg/dL — ABNORMAL HIGH (ref 6–20)
BUN: 61 mg/dL — AB (ref 6–20)
CALCIUM: 8.5 mg/dL — AB (ref 8.9–10.3)
CHLORIDE: 92 mmol/L — AB (ref 101–111)
CO2: 21 mmol/L — AB (ref 22–32)
CO2: 22 mmol/L (ref 22–32)
CREATININE: 5.74 mg/dL — AB (ref 0.61–1.24)
Calcium: 8.9 mg/dL (ref 8.9–10.3)
Chloride: 96 mmol/L — ABNORMAL LOW (ref 101–111)
Creatinine, Ser: 5.97 mg/dL — ABNORMAL HIGH (ref 0.61–1.24)
GFR calc Af Amer: 12 mL/min — ABNORMAL LOW (ref >60)
GFR calc non Af Amer: 11 mL/min — ABNORMAL LOW (ref >60)
GFR, EST AFRICAN AMERICAN: 13 mL/min — AB (ref >60)
GFR, EST NON AFRICAN AMERICAN: 11 mL/min — AB (ref >60)
Glucose, Bld: 293 mg/dL — ABNORMAL HIGH (ref 65–99)
Glucose, Bld: 380 mg/dL — ABNORMAL HIGH (ref 65–99)
POTASSIUM: 5.1 mmol/L (ref 3.5–5.1)
Potassium: 4.5 mmol/L (ref 3.5–5.1)
SODIUM: 123 mmol/L — AB (ref 135–145)
Sodium: 125 mmol/L — ABNORMAL LOW (ref 135–145)

## 2015-08-06 LAB — PREPARE RBC (CROSSMATCH)

## 2015-08-06 LAB — PROTIME-INR
INR: 1.4 (ref 0.00–1.49)
Prothrombin Time: 17.3 seconds — ABNORMAL HIGH (ref 11.6–15.2)

## 2015-08-06 MED ORDER — INSULIN ASPART 100 UNIT/ML ~~LOC~~ SOLN
0.0000 [IU] | Freq: Three times a day (TID) | SUBCUTANEOUS | Status: DC
  Administered 2015-08-06: 11 [IU] via SUBCUTANEOUS
  Administered 2015-08-06: 4 [IU] via SUBCUTANEOUS
  Administered 2015-08-06: 11 [IU] via SUBCUTANEOUS
  Administered 2015-08-07 (×2): 4 [IU] via SUBCUTANEOUS
  Administered 2015-08-07: 7 [IU] via SUBCUTANEOUS
  Administered 2015-08-08: 4 [IU] via SUBCUTANEOUS
  Administered 2015-08-08: 11 [IU] via SUBCUTANEOUS
  Administered 2015-08-09: 3 [IU] via SUBCUTANEOUS
  Administered 2015-08-09: 7 [IU] via SUBCUTANEOUS
  Administered 2015-08-10: 3 [IU] via SUBCUTANEOUS
  Administered 2015-08-10 – 2015-08-12 (×2): 4 [IU] via SUBCUTANEOUS
  Administered 2015-08-13: 3 [IU] via SUBCUTANEOUS

## 2015-08-06 MED ORDER — SODIUM CHLORIDE 0.9 % IV SOLN
Freq: Once | INTRAVENOUS | Status: AC
  Administered 2015-08-06: 17:00:00 via INTRAVENOUS

## 2015-08-06 MED ORDER — ALUM & MAG HYDROXIDE-SIMETH 200-200-20 MG/5ML PO SUSP: 30.0000 mL | Freq: Four times a day (QID) | ORAL | Status: DC | PRN

## 2015-08-06 MED ORDER — ONDANSETRON HCL 4 MG PO TABS: 4.0000 mg | ORAL_TABLET | Freq: Four times a day (QID) | ORAL | Status: DC | PRN

## 2015-08-06 MED ORDER — SODIUM CHLORIDE 0.9 % IV SOLN
INTRAVENOUS | Status: DC
  Administered 2015-08-06 – 2015-08-12 (×10): via INTRAVENOUS

## 2015-08-06 MED ORDER — CLINDAMYCIN PHOSPHATE 600 MG/50ML IV SOLN
600.0000 mg | Freq: Once | INTRAVENOUS | Status: AC
  Administered 2015-08-06: 600 mg via INTRAVENOUS
  Filled 2015-08-06: qty 50

## 2015-08-06 MED ORDER — ALPRAZOLAM 0.5 MG PO TABS
0.5000 mg | ORAL_TABLET | Freq: Three times a day (TID) | ORAL | Status: DC
  Administered 2015-08-06 – 2015-08-14 (×22): 0.5 mg via ORAL
  Filled 2015-08-06 (×23): qty 1

## 2015-08-06 MED ORDER — FUROSEMIDE 10 MG/ML IJ SOLN
200.0000 mg | Freq: Two times a day (BID) | INTRAVENOUS | Status: DC
  Administered 2015-08-06: 200 mg via INTRAVENOUS
  Filled 2015-08-06 (×8): qty 20

## 2015-08-06 MED ORDER — ONDANSETRON HCL 4 MG/2ML IJ SOLN: 4.0000 mg | Freq: Four times a day (QID) | INTRAMUSCULAR | Status: DC | PRN

## 2015-08-06 MED ORDER — VILAZODONE HCL 40 MG PO TABS
20.0000 mg | ORAL_TABLET | Freq: Every day | ORAL | Status: DC
  Administered 2015-08-06 – 2015-08-11 (×6): 20 mg via ORAL
  Filled 2015-08-06 (×5): qty 0.5

## 2015-08-06 MED ORDER — INSULIN GLARGINE 100 UNIT/ML ~~LOC~~ SOLN
42.0000 [IU] | Freq: Every day | SUBCUTANEOUS | Status: DC
  Administered 2015-08-06 – 2015-08-13 (×6): 42 [IU] via SUBCUTANEOUS
  Filled 2015-08-06 (×11): qty 0.42

## 2015-08-06 MED ORDER — SODIUM CHLORIDE 0.9 % IV SOLN
Freq: Once | INTRAVENOUS | Status: AC
  Administered 2015-08-06: 12:00:00 via INTRAVENOUS

## 2015-08-06 MED ORDER — INSULIN ASPART 100 UNIT/ML ~~LOC~~ SOLN
0.0000 [IU] | Freq: Every day | SUBCUTANEOUS | Status: DC
  Administered 2015-08-09: 2 [IU] via SUBCUTANEOUS

## 2015-08-06 MED ORDER — FUROSEMIDE 10 MG/ML IJ SOLN
INTRAMUSCULAR | Status: AC
  Filled 2015-08-06: qty 20

## 2015-08-06 MED ORDER — POLYSACCHARIDE IRON COMPLEX 150 MG PO CAPS
150.0000 mg | ORAL_CAPSULE | Freq: Two times a day (BID) | ORAL | Status: DC
  Administered 2015-08-06 – 2015-08-14 (×16): 150 mg via ORAL
  Filled 2015-08-06 (×16): qty 1

## 2015-08-06 MED ORDER — HYDROMORPHONE HCL 1 MG/ML IJ SOLN
0.5000 mg | INTRAMUSCULAR | Status: DC | PRN
  Administered 2015-08-06 – 2015-08-10 (×11): 1 mg via INTRAVENOUS
  Filled 2015-08-06 (×12): qty 1

## 2015-08-06 MED ORDER — VILAZODONE HCL 20 MG PO TABS: 20.0000 mg | ORAL_TABLET | Freq: Every day | ORAL | Status: DC

## 2015-08-06 MED ORDER — PANTOPRAZOLE SODIUM 40 MG PO TBEC
40.0000 mg | DELAYED_RELEASE_TABLET | Freq: Every day | ORAL | Status: DC
  Administered 2015-08-06 – 2015-08-14 (×8): 40 mg via ORAL
  Filled 2015-08-06 (×8): qty 1

## 2015-08-06 MED ORDER — ACETAMINOPHEN 650 MG RE SUPP: 650.0000 mg | Freq: Four times a day (QID) | RECTAL | Status: DC | PRN

## 2015-08-06 MED ORDER — OXYCODONE HCL 5 MG PO TABS
5.0000 mg | ORAL_TABLET | ORAL | Status: DC | PRN
  Administered 2015-08-09: 5 mg via ORAL
  Filled 2015-08-06: qty 1

## 2015-08-06 MED ORDER — CLINDAMYCIN PHOSPHATE 600 MG/50ML IV SOLN
600.0000 mg | Freq: Three times a day (TID) | INTRAVENOUS | Status: DC
  Administered 2015-08-06 – 2015-08-12 (×19): 600 mg via INTRAVENOUS
  Filled 2015-08-06 (×25): qty 50

## 2015-08-06 MED ORDER — ACETAMINOPHEN 325 MG PO TABS: 650.0000 mg | ORAL_TABLET | Freq: Four times a day (QID) | ORAL | Status: DC | PRN

## 2015-08-06 MED ORDER — OMEGA-3-ACID ETHYL ESTERS 1 G PO CAPS
1000.0000 mg | ORAL_CAPSULE | Freq: Two times a day (BID) | ORAL | Status: DC
  Administered 2015-08-06 – 2015-08-13 (×15): 1000 mg via ORAL
  Filled 2015-08-06 (×15): qty 1

## 2015-08-06 MED ORDER — METOPROLOL TARTRATE 50 MG PO TABS
50.0000 mg | ORAL_TABLET | Freq: Two times a day (BID) | ORAL | Status: DC
  Administered 2015-08-06 – 2015-08-14 (×17): 50 mg via ORAL
  Filled 2015-08-06 (×18): qty 1

## 2015-08-06 NOTE — Consult Note (Signed)
Reason for Consult: Worsening of renal failure Referring Physician: Dr. Gray Bernhardt Maurice [name] is an 39 y.o. male.  HPI: He is a patient with history of diabetes, hypertension, chronic renal failure stage IV presently was sent to the emergency room because of worsening of his right foot ulcer. Patient was here recently for cellulitis of his right foot. During that time patient was put on anti-biotics and wound VAC and sent to be followed as an outpatient. According to the patient he was seen in wound clinic and during his evaluation he was found to have worsening of his wound and was told to go to ER. Presently patient was seen by Dr. Caprice Beaver and was found to have osteomyelitis of his right foot. Patient is possibly going to have amputation of his foot. Consult is called because of worsening of his renal failure and also hyponatremia. Presently patient denies any nausea or vomiting. He denies also any difficulty in breathing.  Past Medical History  Diagnosis Date  . PVC (premature ventricular contraction)   . PAC (premature atrial contraction)   . Diabetes mellitus without complication   . Anxiety   . Depression   . Blood clot in vein     pt sts" it may have been in artery and not vein" of right leg  . GERD (gastroesophageal reflux disease)   . H/O hiatal hernia   . Barrett'[name] esophagus   . Arthritis   . Neuropathy   . Herniated disc, cervical     c5 and c6 and c6 and c7  . Hypertension   . Kidney failure     stage 4 just dx 4 months ago.Sees Dr Lance Coon, nephrologist in [place] [phone].  .  . 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 Diabetic retinopathy of both eyes, associated with type 2 diabetes mellitus   . Osteomyelitis of right foot 06/17/2015    [name]/p debridement, Dr. Caprice Beaver  . Diabetic foot infection 06/16/2015    [name]/p debridement and wound VAC  . Sepsis     Past Surgical History  Procedure Laterality Date  . Wrist arthroscopy      left  with no break reapir of tendond and ligamants  . Toe amputation      5th digit right foot-[place]  . Debridement leg      lower leg-left  . Metatarsal head excision Right 02/14/2013    Procedure: 4TH METATARSAL HEAD RESECTION RIGHT FOOT;  Surgeon: Marcheta Grammes, DPM;  Location: [facility];  Service: Orthopedics;  Laterality: Right;  latex allergy  . Vitrectomy Right   . Application of wound vac Right 06/19/2015    Procedure: APPLICATION OF WOUND VAC;  Surgeon: Caprice Beaver, DPM;  Location: [facility];  Service: Podiatry;  Laterality: Right;  . Irrigation and debridement foot Right 06/19/2015    Procedure: IRRIGATION AND DEBRIDEMENT FOOT ;  Surgeon: Caprice Beaver, DPM;  Location: [facility];  Service: Podiatry;  Laterality: Right;  . Wound debridement Right 07/10/2015    Procedure: DEBRIDEMENT WOUND SUBCUTANEOUS TISSUE, MUSCLE, BONE RIGHT FOOT;  Surgeon: Caprice Beaver, DPM;  Location: [facility];  Service: Podiatry;  Laterality: Right;  . Application of wound vac Right 07/10/2015    Procedure: APPLICATION OF WOUND VAC;  Surgeon: Caprice Beaver, DPM;  Location: [facility];  Service: Podiatry;  Laterality: Right;    Family History  Problem Relation Age of Onset  . Cancer Mother     liver lung  .  Cancer Father     liver  . Diabetes Mother   . Diabetes Father     Social History:  reports that he quit smoking about 7 weeks ago. His smoking use included Cigarettes. He has a 5 pack-year smoking history. His smokeless tobacco use includes Chew. He reports that he drinks alcohol. He reports that he does not use illicit drugs.  Allergies:  Allergies  Allergen Reactions  . Daptomycin     Hypotension   . Bactrim [Sulfamethoxazole-Trimethoprim] Other (See Comments)    Cannot take due to kidney issue  . Dapsone Nausea And Vomiting  . Latex Other (See Comments)    Blisters.   . Penicillins Hives  . Vancomycin Itching    Medications: I have reviewed the patient'[name] current  medications.  Results for orders placed or performed during the hospital encounter of 08/05/15 (from the past 48 hour([name]))  Basic metabolic panel     Status: Abnormal   Collection Time: 08/05/15 11:45 PM  Result Value Ref Range   Sodium 123 (L) 135 - 145 mmol/L   Potassium 5.1 3.5 - 5.1 mmol/L   Chloride 92 (L) 101 - 111 mmol/L   CO2 21 (L) 22 - 32 mmol/L   Glucose, Bld 380 (H) 65 - 99 mg/dL   BUN 61 (H) 6 - 20 mg/dL   Creatinine, Ser 4.97 (H) 0.61 - 1.24 mg/dL   Calcium 8.9 8.9 - 22.2 mg/dL   GFR calc non Af Amer 11 (L) >60 mL/min   GFR calc Af Amer 12 (L) >60 mL/min    Comment: (NOTE) The eGFR has been calculated using the CKD EPI equation. This calculation has not been validated in all clinical situations. eGFR'[name] persistently <60 mL/min signify possible Chronic Kidney Disease.    Anion gap 10 5 - 15  CBC with Differential     Status: Abnormal   Collection Time: 08/05/15 11:45 PM  Result Value Ref Range   WBC 23.7 (H) 4.0 - 10.5 K/uL    Comment: REPEATED TO VERIFY   RBC 2.93 (L) 4.22 - 5.81 MIL/uL   Hemoglobin 8.1 (L) 13.0 - 17.0 g/dL   HCT 61.3 (L) 13.4 - 30.6 %   MCV 82.3 78.0 - 100.0 fL   MCH 27.6 26.0 - 34.0 pg   MCHC 33.6 30.0 - 36.0 g/dL   RDW 52.2 70.9 - 14.3 %   Platelets 369 150 - 400 K/uL   Neutrophils Relative % 82 (H) 43 - 77 %   Neutro Abs 19.5 (H) 1.7 - 7.7 K/uL   Lymphocytes Relative 9 (L) 12 - 46 %   Lymphs Abs 2.1 0.7 - 4.0 K/uL   Monocytes Relative 8 3 - 12 %   Monocytes Absolute 1.9 (H) 0.1 - 1.0 K/uL   Eosinophils Relative 1 0 - 5 %   Eosinophils Absolute 0.1 0.0 - 0.7 K/uL   Basophils Relative 0 0 - 1 %   Basophils Absolute 0.1 0.0 - 0.1 K/uL   WBC Morphology      MODERATE LEFT SHIFT (>5% METAS AND MYELOS,OCC PRO NOTED)    Comment: DOHLE BODIES   RBC Morphology ROULEAUX   Protime-INR     Status: Abnormal   Collection Time: 08/05/15 11:45 PM  Result Value Ref Range   Prothrombin Time 17.3 (H) 11.6 - 15.2 seconds   INR 1.40 0.00 - 1.49  Blood  culture (routine x 2)     Status: None (Preliminary result)   Collection Time: 08/06/15 12:50 AM  Result Value Ref Range   Specimen Description BLOOD RIGHT ANTECUBITAL    Special Requests      BOTTLES DRAWN AEROBIC AND ANAEROBIC AEB 8CC ANA 6CC   Culture NO GROWTH < 12 HOURS    Report Status PENDING   Blood culture (routine x 2)     Status: None (Preliminary result)   Collection Time: 08/06/15  1:03 AM  Result Value Ref Range   Specimen Description BLOOD LEFT ANTECUBITAL    Special Requests      BOTTLES DRAWN AEROBIC AND ANAEROBIC AEB 6CC ANA 10CC   Culture PENDING    Report Status PENDING   Glucose, capillary     Status: Abnormal   Collection Time: 08/06/15  3:17 AM  Result Value Ref Range   Glucose-Capillary 324 (H) 65 - 99 mg/dL   Comment 1 Notify RN    Comment 2 Document in Chart   Basic metabolic panel     Status: Abnormal   Collection Time: 08/06/15  6:14 AM  Result Value Ref Range   Sodium 125 (L) 135 - 145 mmol/L   Potassium 4.5 3.5 - 5.1 mmol/L   Chloride 96 (L) 101 - 111 mmol/L   CO2 22 22 - 32 mmol/L   Glucose, Bld 293 (H) 65 - 99 mg/dL   BUN 59 (H) 6 - 20 mg/dL   Creatinine, Ser 5.74 (H) 0.61 - 1.24 mg/dL   Calcium 8.5 (L) 8.9 - 10.3 mg/dL   GFR calc non Af Amer 11 (L) >60 mL/min   GFR calc Af Amer 13 (L) >60 mL/min    Comment: (NOTE) The eGFR has been calculated using the CKD EPI equation. This calculation has not been validated in all clinical situations. eGFR'[name] persistently <60 mL/min signify possible Chronic Kidney Disease.    Anion gap 7 5 - 15  CBC     Status: Abnormal   Collection Time: 08/06/15  6:14 AM  Result Value Ref Range   WBC 18.3 (H) 4.0 - 10.5 K/uL   RBC 2.45 (L) 4.22 - 5.81 MIL/uL   Hemoglobin 6.8 (LL) 13.0 - 17.0 g/dL    Comment: CRITICAL RESULT CALLED TO, READ BACK BY AND VERIFIED WITH: RESULT REPEATED AND VERIFIED NURSE L FRIK [id number] 08/06/15 BY E AGUNDIZ    HCT 20.0 (L) 39.0 - 52.0 %   MCV 81.6 78.0 - 100.0 fL   MCH 27.8 26.0 -  34.0 pg   MCHC 34.0 30.0 - 36.0 g/dL   RDW 14.5 11.5 - 15.5 %   Platelets 360 150 - 400 K/uL  Glucose, capillary     Status: Abnormal   Collection Time: 08/06/15  7:43 AM  Result Value Ref Range   Glucose-Capillary 263 (H) 65 - 99 mg/dL   Comment 1 Notify RN    Comment 2 Document in Chart   Type and screen     Status: None (Preliminary result)   Collection Time: 08/06/15  9:21 AM  Result Value Ref Range   ABO/RH(D) O NEG    Antibody Screen NEG    Sample Expiration 08/09/2015    Unit Number [id number]    Blood Component Type RED CELLS,LR    Unit division 00    Status of Unit ALLOCATED    Transfusion Status OK TO TRANSFUSE    Crossmatch Result Compatible   Prepare RBC     Status: None   Collection Time: 08/06/15  9:21 AM  Result Value Ref Range   Order Confirmation ORDER PROCESSED BY BLOOD BANK  Mr Foot Right Wo Contrast  08/06/2015   CLINICAL DATA:  Acute right foot pain. Multiple surgeries to the right foot.  EXAM: MRI OF THE RIGHT FOREFOOT WITHOUT CONTRAST  TECHNIQUE: Multiplanar, multisequence MR imaging was performed. No intravenous contrast was administered.  COMPARISON:  X-ray 08/05/2015, MR foot 06/16/2015  FINDINGS: There has been interval resection of the fifth metatarsal stump. There is a soft tissue wound along the dorsal aspect of the midfoot and lateral aspect of the foot. There is extensive soft tissue air in the midfoot. There is cortical irregularity involving the cuboid and lateral cuneiform with severe T1 signal abnormality most concerning for osteomyelitis. There is cortical irregularity with marrow edema and T1 hypointensity at the lateral base of the fourth metatarsal. There is a nondisplaced fracture of the bases of the first, second and third metatarsals with severe marrow edema. There is marrow edema in the medial and middle cuneiform with cortical irregularity and corresponding T1 hypointensity. There is minimal marrow edema in the navicular without cortical  destruction. There is no drainable fluid collection.  IMPRESSION: 1. Soft tissue wound along the dorsal aspect of the midfoot and lateral aspect of the midfoot with extensive soft tissue air. There is cortical irregularity with corresponding T1 hypointensity involving the cuboid, lateral cuneiform and lateral base of the fourth metatarsal most concerning for osteomyelitis. 2. Cortical irregularity with marrow edema involving the medial and middle cuneiforms also concerning for osteomyelitis. 3. Nondisplaced fractures of the bases of the first, second and third metatarsals with severe marrow edema with adjacent soft tissue wound and air. 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 4. Mild edema in the navicular which may be reactive versus early osteomyelitis. No definite bone destruction.   Electronically Signed   By: Kathreen Devoid   On: 08/06/2015 08:40   Dg Foot Complete Right  08/06/2015   CLINICAL DATA:  Ulcers in the right foot for several months. No injury. History of diabetes. Right foot pain.  EXAM: RIGHT FOOT COMPLETE - 3+ VIEW  COMPARISON:  07/10/2015  FINDINGS: Examination is technically limited due to patient positioning. There has been previous resection of the fifth toe and metatarsal. Previous resection of the distal fourth metatarsal head. Since the previous study, there is interval development of soft tissue swelling diffusely throughout the right midfoot with superior skin ulceration and multiple soft tissue gas collections consistent with infection with gas-forming organism. There is probable bone destruction involving the distal tarsal bones and possibly involving the proximal metatarsals. Findings suggest probable osteomyelitis. Vascular calcifications.  IMPRESSION: Diffuse soft tissue swelling with subcutaneous emphysema consistent with infection with gas-forming organism. Bone destruction and sclerosis involving the  distal tarsal bones and probably the proximal metatarsal bones is suspicious for osteomyelitis and possibly joint space infection. Previous amputations.  These results were called by telephone at the time of interpretation on 08/06/2015 at 12:25 am to Dr. Eulis Foster, who verbally acknowledged these results.   Electronically Signed   By: Lucienne Capers M.D.   On: 08/06/2015 00:28    Review of Systems  Constitutional: Positive for fever and chills.  Respiratory: Negative for shortness of breath.   Cardiovascular: Positive for leg swelling. Negative for orthopnea.  Gastrointestinal: Negative for nausea, vomiting and abdominal pain.  Neurological: Positive for weakness.   Blood pressure 159/79, pulse 107, temperature 98.3 F (36.8 C), temperature source Oral, resp. rate 20, height $RemoveBe'6\' 3"'EPboBgeXG$  (1.905 m), weight 226 lb 1.6 oz (102.558  kg), SpO2 100 %. Physical Exam  Constitutional: He is oriented to person, place, and time. No distress.  Eyes: No scleral icterus.  Neck: No JVD present.  Cardiovascular: Normal rate and regular rhythm.   Respiratory: No respiratory distress. He has no wheezes.  GI: He exhibits no distension. There is no tenderness.  Musculoskeletal: He exhibits edema.  Neurological: He is alert and oriented to person, place, and time.  Skin: There is erythema.    Assessment/Plan: Problem #1 acute kidney injury superimposed on chronic. His renal function is slightly better today. Possibly prerenal syndrome/ATN/AIN. Problem #2 hyponatremia: Possibly hypovolemic hyponatremia. Problem #3 osteomylitis of his right foot Problem #4 anemia: Common issue of iron deficiency and anemia of chronic disease. Presently he is on iron supplement. His hemoglobin is low. Problem# 5 hypertension: His blood pressure is reasonably controlled Problem #6 diabetes Problem #7 metabolic bone disease: His calcium is a range.  Problem #8 metabolic acidosis: His CO2 is better. Plan: 1]We'll put patient on free  water restriction 2] we'll continue his IV fluid 3] we'll start patient on Lasix 40 mg IV once a day 4] we'll check his basic metabolic panel in the morning including his phosphorus. 5] patient doesn't require dialysis for now.   Maurice [name] 08/06/2015, 11:33 AM

## 2015-08-06 NOTE — Progress Notes (Signed)
Inpatient Diabetes Program Recommendations  AACE/ADA: New Consensus Statement on Inpatient Glycemic Control (2013)  Target Ranges:  Prepandial:   less than 140 mg/dL      Peak postprandial:   less than 180 mg/dL (1-2 hours)      Critically ill patients:  140 - 180 mg/dL   Results for Maurice [name], Maurice [name] (MRN [id number]) as of 08/06/2015 07:46  Ref. Range 08/06/2015 03:17 08/06/2015 07:43  Glucose-Capillary Latest Ref Range: 65-99 mg/dL 119 (H) 147 (H)     Outpatient Diabetes medications: Lantus 42 units QHS, Novolog 0-75 units per day (uses correction of 1:20 or 1:30 (depending on activity for the day) and uses carb coverage of 1:15 or 1:20 (1 unit for 15 -20 grams of carbs) Current orders for Inpatient glycemic control: Lantus 42 units QHS, Novolog 0-20 units TID with meals, Novolog 0-5 units HS  Inpatient Diabetes Program Recommendations Insulin - Basal: During patient's last admission from 07/08/15 to 07/11/15 patient was ordered Lantus 15 units QHS and fasting glucose was 111-167 mg/dl. Patient is currently ordered Lantus 42 units QHS. May want to consider decreasing Lantus to 20 units and change frequency to daily so that patient receives Lantus now since he did not receive any Lantus last night. Insulin - Meal Coverage: Please consider ordering Novolog 4 units TID with meals for meal coverage (in addition to Novolog correction scale).  Thanks, Orlando Penner, RN, MSN, CCRN, CDE Diabetes Coordinator Inpatient Diabetes Program [phone] (Team Pager from 8am to 5pm) [phone] ([facility] office) [phone] [facility] office) [phone] [facility] office)

## 2015-08-06 NOTE — Consult Note (Addendum)
Podiatry Consult Note  Reason for Consultation:  Osteomyelitis of the right foot  History of Present Illness: Maurice [name] is a 39 y.o. male who is well known to me from my private office.  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  He has been surgically debrided and Wound VAC therapy has been utilized.  He was recently referred to the [facility] for further evaluation and treatment.  I spoke with Dr. Lillia Carmel last night.  He stated that [name] presented yesterday for a scheduled appointment.  He noted significant changes and recommended admission.  [name]'s wife is present at bedside.   Past Medical History  Diagnosis Date  . PVC (premature ventricular contraction)   . PAC (premature atrial contraction)   . Diabetes mellitus without complication   . Anxiety   . Depression   . Blood clot in vein     pt sts" it may have been in artery and not vein" of right leg  . GERD (gastroesophageal reflux disease)   . H/O hiatal hernia   . Barrett's esophagus   . Arthritis   . Neuropathy   . Herniated disc, cervical     c5 and c6 and c6 and c7  . Hypertension   . Kidney failure     stage 4 just dx 4 months ago.Sees Dr Elby Beck, nephrologist in [place] [phone].  .  . 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 Diabetic retinopathy of both eyes, associated with type 2 diabetes mellitus   . Osteomyelitis of right foot 06/17/2015    S/p debridement, Dr. [name]  . Diabetic foot infection 06/16/2015    S/p debridement and wound VAC  . Sepsis    Scheduled Meds: . ALPRAZolam  0.5 mg Oral TID  . insulin aspart  0-20 Units Subcutaneous TID WC  . insulin aspart  0-5 Units Subcutaneous QHS  . insulin glargine  42 Units Subcutaneous QHS  . iron polysaccharides  150 mg Oral BID  . metoprolol  50 mg Oral BID  . omega-3 acid ethyl esters  1,000 mg Oral BID  . pantoprazole  40 mg Oral Daily  .  Vilazodone HCl  20 mg Oral Daily   Continuous Infusions: . sodium chloride 75 mL/hr at 08/06/15 0328   PRN Meds:.acetaminophen **OR** acetaminophen, alum & mag hydroxide-simeth, HYDROmorphone (DILAUDID) injection, ondansetron **OR** ondansetron (ZOFRAN) IV, oxyCODONE  Allergies  Allergen Reactions  . Daptomycin     Hypotension   . Bactrim [Sulfamethoxazole-Trimethoprim] Other (See Comments)    Cannot take due to kidney issue  . Dapsone Nausea And Vomiting  . Latex Other (See Comments)    Blisters.   . Penicillins Hives  . Vancomycin Itching   Past Surgical History  Procedure Laterality Date  . Wrist arthroscopy      left with no break reapir of tendond and ligamants  . Toe amputation      5th digit right foot-[place]  . Debridement leg      lower leg-left  . Metatarsal head excision Right 02/14/2013    Procedure: 4TH METATARSAL HEAD RESECTION RIGHT FOOT;  Surgeon: Dallas Schimke, DPM;  Location: [facility];  Service: Orthopedics;  Laterality: Right;  latex allergy  . Vitrectomy Right   . Application of wound vac Right 06/19/2015    Procedure: APPLICATION OF WOUND VAC;  Surgeon: [name], DPM;  Location: [facility];  Service: Podiatry;  Laterality: Right;  . Irrigation and debridement foot Right 06/19/2015    Procedure: IRRIGATION AND DEBRIDEMENT FOOT ;  Surgeon: [name], DPM;  Location: [facility];  Service: Podiatry;  Laterality: Right;  . Wound debridement Right 07/10/2015    Procedure: DEBRIDEMENT WOUND SUBCUTANEOUS TISSUE, MUSCLE, BONE RIGHT FOOT;  Surgeon: [name], DPM;  Location: [facility];  Service: Podiatry;  Laterality: Right;  . Application of wound vac Right 07/10/2015    Procedure: APPLICATION OF WOUND VAC;  Surgeon: [name], DPM;  Location: [facility];  Service: Podiatry;  Laterality: Right;   Family History  Problem Relation Age of Onset  . Cancer Mother     liver lung  . Cancer Father     liver  . Diabetes Mother   . Diabetes  Father    Social History:  reports that he quit smoking about 7 weeks ago. His smoking use included Cigarettes. He has a 5 pack-year smoking history. His smokeless tobacco use includes Chew. He reports that he drinks alcohol. He reports that he does not use illicit drugs.  Review of Systems: Constitutional:  Denies recent fever or vomiting.  He has experienced some chills and nausea. Integument:  Nonhealing ulceration of the right foot. Cardiovascular:  Edema of the right lower extremity. Neurologic:  Numbness of both feet.  Physical Examination: Vital signs in last 24 hours:   Temp:  [98.1 F (36.7 C)-98.8 F (37.1 C)] 98.3 F (36.8 C) (09/08 0314) Pulse Rate:  [98-107] 107 (09/08 0314) Resp:  [16-20] 20 (09/08 0314) BP: (123-159)/(77-87) 159/79 mmHg (09/08 0314) SpO2:  [95 %-100 %] 100 % (09/08 0314) Weight:  [226 lb 1.6 oz (102.558 kg)-248 lb (112.492 kg)] 226 lb 1.6 oz (102.558 kg) (09/08 0314)  GENERAL:  Alert. INTEGUMENT: Skin of the right foot is warm with a purple discoloration.  The skin of the right lower leg is brawny.  There is a full-thickness ulceration / wound along the plantar lateral aspect of the right foot.  The distal aspect of the ulceration is granular.  However, the majority of the wound bed is comprised of fibrotic and necrotic tissue.  There is an open wound along the dorsal aspect of the right midfoot with a fibrotic base.  Tendons are exposed.  There is an eschar present along the anterior aspect of the right ankle .  Overall, the foot looks much worse than at his last visit with me.  There is erythema extending proximally to the midportion of the lower leg.  Skin of the left foot is warm and dry.  No open wounds of the left foot are present.   VASCULAR:  DP is palpable bilaterally.  PT is palpable on the left and obscurred by edema on the right.  There is edema both lower extremities.  This is significantly more pronounced on the right and left. MUSCULOSKELETAL:   Muscle tone is normal bilaterally.  NEUROLOGIC:  Protective sensation is diminished bilaterally.  Lab/Test Results:   Recent Labs  08/05/15 2345 08/06/15 0614  WBC 23.7* 18.3*  HGB 8.1* 6.8*  HCT 24.1* 20.0*  PLT 369 360  NA 123* 125*  K 5.1 4.5  CL 92* 96*  CO2 21* 22  BUN 61* 59*  CREATININE 5.97* 5.74*  GLUCOSE 380* 293*  CALCIUM 8.9 8.5*    Recent Results (from the past 240 hour(s))  Blood culture (routine x 2)     Status: None (Preliminary result)   Collection Time: 08/06/15 12:50 AM  Result Value  Ref Range Status   Specimen Description BLOOD RIGHT ANTECUBITAL  Final   Special Requests   Final    BOTTLES DRAWN AEROBIC AND ANAEROBIC AEB 8CC ANA 6CC   Culture PENDING  Incomplete   Report Status PENDING  Incomplete  Blood culture (routine x 2)     Status: None (Preliminary result)   Collection Time: 08/06/15  1:03 AM  Result Value Ref Range Status   Specimen Description BLOOD LEFT ANTECUBITAL  Final   Special Requests   Final    BOTTLES DRAWN AEROBIC AND ANAEROBIC AEB 6CC ANA 10CC   Culture PENDING  Incomplete   Report Status PENDING  Incomplete     Dg Foot Complete Right  08/06/2015   CLINICAL DATA:  Ulcers in the right foot for several months. No injury. History of diabetes. Right foot pain.  EXAM: RIGHT FOOT COMPLETE - 3+ VIEW  COMPARISON:  07/10/2015  FINDINGS: Examination is technically limited due to patient positioning. There has been previous resection of the fifth toe and metatarsal. Previous resection of the distal fourth metatarsal head. Since the previous study, there is interval development of soft tissue swelling diffusely throughout the right midfoot with superior skin ulceration and multiple soft tissue gas collections consistent with infection with gas-forming organism. There is probable bone destruction involving the distal tarsal bones and possibly involving the proximal metatarsals. Findings suggest probable osteomyelitis. Vascular calcifications.   IMPRESSION: Diffuse soft tissue swelling with subcutaneous emphysema consistent with infection with gas-forming organism. Bone destruction and sclerosis involving the distal tarsal bones and probably the proximal metatarsal bones is suspicious for osteomyelitis and possibly joint space infection. Previous amputations.  These results were called by telephone at the time of interpretation on 08/06/2015 at 12:25 am to Dr. Effie Shy, who verbally acknowledged these results.   Electronically Signed   By: Burman Nieves M.D.   On: 08/06/2015 00:28    Assessment: 1.  Osteomyelitis, right foot. 2.  Cellulitis, right lower extremity. 3.  Diabetes mellitus with peripheral neuropathy.  Plan: The podiatric pathology and treatment options were explained to the patient and his wife.  I reviewed the radiographs performed at his last visit compared to his recent radiographs.  There is significant destruction of the midfoot consistent with osteomyelitis not present at his last admission.  The radiologist noted soft tissue emphysema on plain film radiography.  The areas of soft tissue emphysema correspond to the location of the ulcerations/wounds.  I reviewed the MRI findings.  The formal report from radiology is pending.  Given the radiographic and MRI findings, I do not feel that the foot is salvageable.  This was explained to the patient and his wife.  I consulted Dr. Lovell Sheehan for an amputation of the right lower extremity.  I spoke him regarding this case.  I spent 50 minutes in consultation with the patient and in coordination of care.  Floris Neuhaus IVAN 08/06/2015, 7:56 AM

## 2015-08-06 NOTE — Progress Notes (Signed)
Patient admitted by Dr. Lovell Sheehan earlier this morning  Patient seen and examined.  He has been admitted with osteomyelitis of right foot and will need BKA. General surgery is following. He has anemia of chronic disease and will need to be transfused in order to optimize for surgery. He has acute on chronic renal failure and nephrology is following. He is on IV fluids. Hyponatremia is improving. He is on clindamycin for antibiotic coverage. Will continue to follow.  MEMON,JEHANZEB

## 2015-08-06 NOTE — Progress Notes (Signed)
Patient's medication vilazodone has been sent to supervisor to be sent to pharmacy.

## 2015-08-06 NOTE — Consult Note (Signed)
Reason for Consult: Gangrene, right foot Referring Physician: Hospitalist, Dr. Dyann Ruddle Maurice [name] is an 39 y.o. male.  HPI: Patient is a 39 year old white male multiple medical problems including acute renal failure, diabetes mellitus, and anemia who had been managed by Dr. Caprice Beaver for ongoing osteomyelitis in the right foot. He has undergone multiple debridement procedures recently. He was followed by the wound care center in [place]. He was admitted to hospital due to worsening ischemia of the right foot. Dr. Caprice Beaver has seen him and feels that a right below the knee amputation is indicated as he is failing conservative management.  Past Medical History  Diagnosis Date  . PVC (premature ventricular contraction)   . PAC (premature atrial contraction)   . Diabetes mellitus without complication   . Anxiety   . Depression   . Blood clot in vein     pt sts" it may have been in artery and not vein" of right leg  . GERD (gastroesophageal reflux disease)   . H/O hiatal hernia   . Barrett's esophagus   . Arthritis   . Neuropathy   . Herniated disc, cervical     c5 and c6 and c6 and c7  . Hypertension   . Kidney failure     stage 4 just dx 4 months ago.Sees Dr Lance Coon, nephrologist in [place] [phone].  .  . 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 Diabetic retinopathy of both eyes, associated with type 2 diabetes mellitus   . Osteomyelitis of right foot 06/17/2015    S/p debridement, Dr. Caprice Beaver  . Diabetic foot infection 06/16/2015    S/p debridement and wound VAC  . Sepsis     Past Surgical History  Procedure Laterality Date  . Wrist arthroscopy      left with no break reapir of tendond and ligamants  . Toe amputation      5th digit right foot-[place]  . Debridement leg      lower leg-left  . Metatarsal head excision Right 02/14/2013    Procedure: 4TH METATARSAL HEAD RESECTION RIGHT FOOT;  Surgeon: Marcheta Grammes, DPM;   Location: [facility];  Service: Orthopedics;  Laterality: Right;  latex allergy  . Vitrectomy Right   . Application of wound vac Right 06/19/2015    Procedure: APPLICATION OF WOUND VAC;  Surgeon: Caprice Beaver, DPM;  Location: [facility];  Service: Podiatry;  Laterality: Right;  . Irrigation and debridement foot Right 06/19/2015    Procedure: IRRIGATION AND DEBRIDEMENT FOOT ;  Surgeon: Caprice Beaver, DPM;  Location: [facility];  Service: Podiatry;  Laterality: Right;  . Wound debridement Right 07/10/2015    Procedure: DEBRIDEMENT WOUND SUBCUTANEOUS TISSUE, MUSCLE, BONE RIGHT FOOT;  Surgeon: Caprice Beaver, DPM;  Location: [facility];  Service: Podiatry;  Laterality: Right;  . Application of wound vac Right 07/10/2015    Procedure: APPLICATION OF WOUND VAC;  Surgeon: Caprice Beaver, DPM;  Location: [facility];  Service: Podiatry;  Laterality: Right;    Family History  Problem Relation Age of Onset  . Cancer Mother     liver lung  . Cancer Father     liver  . Diabetes Mother   . Diabetes Father     Social History:  reports that he quit smoking about 7 weeks ago. His smoking use included Cigarettes. He has a 5 pack-year smoking history. His smokeless tobacco use includes Chew. He reports that he drinks alcohol. He reports  that he does not use illicit drugs.  Allergies:  Allergies  Allergen Reactions  . Daptomycin     Hypotension   . Bactrim [Sulfamethoxazole-Trimethoprim] Other (See Comments)    Cannot take due to kidney issue  . Dapsone Nausea And Vomiting  . Latex Other (See Comments)    Blisters.   . Penicillins Hives  . Vancomycin Itching    Medications: I have reviewed the patient's current medications.  Results for orders placed or performed during the hospital encounter of 08/05/15 (from the past 48 hour(s))  Basic metabolic panel     Status: Abnormal   Collection Time: 08/05/15 11:45 PM  Result Value Ref Range   Sodium 123 (L) 135 - 145 mmol/L   Potassium 5.1 3.5 - 5.1 mmol/L    Chloride 92 (L) 101 - 111 mmol/L   CO2 21 (L) 22 - 32 mmol/L   Glucose, Bld 380 (H) 65 - 99 mg/dL   BUN 61 (H) 6 - 20 mg/dL   Creatinine, Ser 5.97 (H) 0.61 - 1.24 mg/dL   Calcium 8.9 8.9 - 10.3 mg/dL   GFR calc non Af Amer 11 (L) >60 mL/min   GFR calc Af Amer 12 (L) >60 mL/min    Comment: (NOTE) The eGFR has been calculated using the CKD EPI equation. This calculation has not been validated in all clinical situations. eGFR's persistently <60 mL/min signify possible Chronic Kidney Disease.    Anion gap 10 5 - 15  CBC with Differential     Status: Abnormal   Collection Time: 08/05/15 11:45 PM  Result Value Ref Range   WBC 23.7 (H) 4.0 - 10.5 K/uL    Comment: REPEATED TO VERIFY   RBC 2.93 (L) 4.22 - 5.81 MIL/uL   Hemoglobin 8.1 (L) 13.0 - 17.0 g/dL   HCT 24.1 (L) 39.0 - 52.0 %   MCV 82.3 78.0 - 100.0 fL   MCH 27.6 26.0 - 34.0 pg   MCHC 33.6 30.0 - 36.0 g/dL   RDW 14.5 11.5 - 15.5 %   Platelets 369 150 - 400 K/uL   Neutrophils Relative % 82 (H) 43 - 77 %   Neutro Abs 19.5 (H) 1.7 - 7.7 K/uL   Lymphocytes Relative 9 (L) 12 - 46 %   Lymphs Abs 2.1 0.7 - 4.0 K/uL   Monocytes Relative 8 3 - 12 %   Monocytes Absolute 1.9 (H) 0.1 - 1.0 K/uL   Eosinophils Relative 1 0 - 5 %   Eosinophils Absolute 0.1 0.0 - 0.7 K/uL   Basophils Relative 0 0 - 1 %   Basophils Absolute 0.1 0.0 - 0.1 K/uL   WBC Morphology      MODERATE LEFT SHIFT (>5% METAS AND MYELOS,OCC PRO NOTED)    Comment: DOHLE BODIES   RBC Morphology ROULEAUX   Protime-INR     Status: Abnormal   Collection Time: 08/05/15 11:45 PM  Result Value Ref Range   Prothrombin Time 17.3 (H) 11.6 - 15.2 seconds   INR 1.40 0.00 - 1.49  Blood culture (routine x 2)     Status: None (Preliminary result)   Collection Time: 08/06/15 12:50 AM  Result Value Ref Range   Specimen Description BLOOD RIGHT ANTECUBITAL    Special Requests      BOTTLES DRAWN AEROBIC AND ANAEROBIC AEB 8CC ANA 6CC   Culture NO GROWTH < 12 HOURS    Report Status  PENDING   Blood culture (routine x 2)     Status: None (Preliminary  result)   Collection Time: 08/06/15  1:03 AM  Result Value Ref Range   Specimen Description BLOOD LEFT ANTECUBITAL    Special Requests      BOTTLES DRAWN AEROBIC AND ANAEROBIC AEB 6CC ANA 10CC   Culture PENDING    Report Status PENDING   Glucose, capillary     Status: Abnormal   Collection Time: 08/06/15  3:17 AM  Result Value Ref Range   Glucose-Capillary 324 (H) 65 - 99 mg/dL   Comment 1 Notify RN    Comment 2 Document in Chart   Basic metabolic panel     Status: Abnormal   Collection Time: 08/06/15  6:14 AM  Result Value Ref Range   Sodium 125 (L) 135 - 145 mmol/L   Potassium 4.5 3.5 - 5.1 mmol/L   Chloride 96 (L) 101 - 111 mmol/L   CO2 22 22 - 32 mmol/L   Glucose, Bld 293 (H) 65 - 99 mg/dL   BUN 59 (H) 6 - 20 mg/dL   Creatinine, Ser 6.18 (H) 0.61 - 1.24 mg/dL   Calcium 8.5 (L) 8.9 - 10.3 mg/dL   GFR calc non Af Amer 11 (L) >60 mL/min   GFR calc Af Amer 13 (L) >60 mL/min    Comment: (NOTE) The eGFR has been calculated using the CKD EPI equation. This calculation has not been validated in all clinical situations. eGFR's persistently <60 mL/min signify possible Chronic Kidney Disease.    Anion gap 7 5 - 15  CBC     Status: Abnormal   Collection Time: 08/06/15  6:14 AM  Result Value Ref Range   WBC 18.3 (H) 4.0 - 10.5 K/uL   RBC 2.45 (L) 4.22 - 5.81 MIL/uL   Hemoglobin 6.8 (LL) 13.0 - 17.0 g/dL    Comment: CRITICAL RESULT CALLED TO, READ BACK BY AND VERIFIED WITH: RESULT REPEATED AND VERIFIED NURSE L FRIK [id number] 08/06/15 BY E AGUNDIZ    HCT 20.0 (L) 39.0 - 52.0 %   MCV 81.6 78.0 - 100.0 fL   MCH 27.8 26.0 - 34.0 pg   MCHC 34.0 30.0 - 36.0 g/dL   RDW 58.1 92.9 - 73.6 %   Platelets 360 150 - 400 K/uL  Glucose, capillary     Status: Abnormal   Collection Time: 08/06/15  7:43 AM  Result Value Ref Range   Glucose-Capillary 263 (H) 65 - 99 mg/dL   Comment 1 Notify RN    Comment 2 Document in Chart    Type and screen     Status: None (Preliminary result)   Collection Time: 08/06/15  9:21 AM  Result Value Ref Range   ABO/RH(D) O NEG    Antibody Screen NEG    Sample Expiration 08/09/2015    Unit Number [id number]    Blood Component Type RED CELLS,LR    Unit division 00    Status of Unit ISSUED    Transfusion Status OK TO TRANSFUSE    Crossmatch Result Compatible   Prepare RBC     Status: None   Collection Time: 08/06/15  9:21 AM  Result Value Ref Range   Order Confirmation ORDER PROCESSED BY BLOOD BANK   Glucose, capillary     Status: Abnormal   Collection Time: 08/06/15 11:41 AM  Result Value Ref Range   Glucose-Capillary 258 (H) 65 - 99 mg/dL   Comment 1 Notify RN    Comment 2 Document in Chart     Mr Foot Right Wo Contrast  08/06/2015  CLINICAL DATA:  Acute right foot pain. Multiple surgeries to the right foot.  EXAM: MRI OF THE RIGHT FOREFOOT WITHOUT CONTRAST  TECHNIQUE: Multiplanar, multisequence MR imaging was performed. No intravenous contrast was administered.  COMPARISON:  X-ray 08/05/2015, MR foot 06/16/2015  FINDINGS: There has been interval resection of the fifth metatarsal stump. There is a soft tissue wound along the dorsal aspect of the midfoot and lateral aspect of the foot. There is extensive soft tissue air in the midfoot. There is cortical irregularity involving the cuboid and lateral cuneiform with severe T1 signal abnormality most concerning for osteomyelitis. There is cortical irregularity with marrow edema and T1 hypointensity at the lateral base of the fourth metatarsal. There is a nondisplaced fracture of the bases of the first, second and third metatarsals with severe marrow edema. There is marrow edema in the medial and middle cuneiform with cortical irregularity and corresponding T1 hypointensity. There is minimal marrow edema in the navicular without cortical destruction. There is no drainable fluid collection.  IMPRESSION: 1. Soft tissue wound along the  dorsal aspect of the midfoot and lateral aspect of the midfoot with extensive soft tissue air. There is cortical irregularity with corresponding T1 hypointensity involving the cuboid, lateral cuneiform and lateral base of the fourth metatarsal most concerning for osteomyelitis. 2. Cortical irregularity with marrow edema involving the medial and middle cuneiforms also concerning for osteomyelitis. 3. Nondisplaced fractures of the bases of the first, second and third metatarsals with severe marrow edema with adjacent soft tissue wound and air. 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 4. Mild edema in the navicular which may be reactive versus early osteomyelitis. No definite bone destruction.   Electronically Signed   By: Kathreen Devoid   On: 08/06/2015 08:40   Dg Foot Complete Right  08/06/2015   CLINICAL DATA:  Ulcers in the right foot for several months. No injury. History of diabetes. Right foot pain.  EXAM: RIGHT FOOT COMPLETE - 3+ VIEW  COMPARISON:  07/10/2015  FINDINGS: Examination is technically limited due to patient positioning. There has been previous resection of the fifth toe and metatarsal. Previous resection of the distal fourth metatarsal head. Since the previous study, there is interval development of soft tissue swelling diffusely throughout the right midfoot with superior skin ulceration and multiple soft tissue gas collections consistent with infection with gas-forming organism. There is probable bone destruction involving the distal tarsal bones and possibly involving the proximal metatarsals. Findings suggest probable osteomyelitis. Vascular calcifications.  IMPRESSION: Diffuse soft tissue swelling with subcutaneous emphysema consistent with infection with gas-forming organism. Bone destruction and sclerosis involving the distal tarsal bones and probably the proximal metatarsal bones is suspicious for osteomyelitis  and possibly joint space infection. Previous amputations.  These results were called by telephone at the time of interpretation on 08/06/2015 at 12:25 am to Dr. Eulis Foster, who verbally acknowledged these results.   Electronically Signed   By: Lucienne Capers M.D.   On: 08/06/2015 00:28    ROS: See chart Blood pressure 95/54, pulse 74, temperature 98.2 F (36.8 C), temperature source Oral, resp. rate 20, height $RemoveBe'6\' 3"'lTsomjLXN$  (1.905 m), weight 102.558 kg (226 lb 1.6 oz), SpO2 100 %. Physical Exam: Depressed white male in no acute distress. Extremity examination reveals a palpable right femoral pulse. A wrap is around the right foot with the distal digits noted to be ischemic in nature. He does have cellulitis just proximal to the ankle. The calf  muscle is soft. The skin is somewhat fragile but no obvious cellulitis is noted proximal to the cellulitis around the right ankle.  Assessment/Plan: Impression: Progressive ischemia of right foot with cellulitis, acute renal failure, anemia, hyponatremia, hyperglycemia Plan: Agree with need for right below the knee amputation as he has failed aggressive conservative management. His other medical issues need to be addressed prior to undergoing surgery. Will temporarily schedule surgery for 08/10/2015. Will order noninvasive vascular study for tomorrow.  Harish Bram A 08/06/2015, 12:39 PM

## 2015-08-06 NOTE — H&P (Addendum)
[facility] Admission History and Physical       Maurice [name] NWG:[id number] DOB: October 25, 1976 DOA: 08/05/2015  Referring physician: EDP PCP: PROVIDER NOT IN SYSTEM  Specialists:   Chief Complaint: Worsening Right Foot Ulcers  HPI: Maurice [name] is a 39 y.o. male with a history of Uncontrolled DM-1, Stage IV CKD, and Osteomyelitis of the Right Foot who presents to the ED with complaints of worsening of his foot wounds over the last 5-6 days.   He also reports having fevers and chills off and on .   He completed oral Levaquin therapy 2 days ago.   He reports that his Wound Care  Doctor Dr Charlean Merl has been out of town for the past week.   He was advised to go to the ED by his podiatrist Dr. [name] in [place].    He was evaluated in the ED and placed on IV Clindamycin and referred for admission.     Of Note: he reports that he has been battling his right foot wound and ulcers x 6 months, and reports that his would worsened with the application of a powder formulation of Calmoseptine.   He also has numerous Antibiotic allergies.  He had an MRI on 06/16/2015 revealing Osteomyelitis, and has had 2   I+Ds performed since then and had Wound VAc placement.      Review of Systems:  Constitutional: No Weight Loss, No Weight Gain, Night Sweats, +Fevers, +Chills, Dizziness, Light Headedness, Fatigue, or Generalized Weakness HEENT: No Headaches, Difficulty Swallowing,Tooth/Dental Problems,Sore Throat,  No Sneezing, Rhinitis, Ear Ache, Nasal Congestion, or Post Nasal Drip,  Cardio-vascular:  No Chest pain, Orthopnea, PND, Edema in Lower Extremities, Anasarca, Dizziness, Palpitations  Resp: No Dyspnea, No DOE, No Productive Cough, No Non-Productive Cough, No Hemoptysis, No Wheezing.    GI: No Heartburn, Indigestion, Abdominal Pain, Nausea, Vomiting, Diarrhea, Constipation, Hematemesis, Hematochezia, Melena, Change in Bowel Habits,  Loss of Appetite  GU: No Dysuria, No Change in Color of  Urine, No Urgency or Urinary Frequency, No Flank pain.  Musculoskeletal: No Joint Pain or Swelling, No Decreased Range of Motion, No Back Pain.  Neurologic: No Syncope, No Seizures, Muscle Weakness, Paresthesia, Vision Disturbance or Loss, No Diplopia, No Vertigo, No Difficulty Walking,  Skin: Numerous Ulcers of the Dorsum and Lateral Surface of the Right Foot    No Rash or Lesions. Psych: No Change in Mood or Affect, No Depression or Anxiety, No Memory loss, No Confusion, or Hallucinations   Past Medical History  Diagnosis Date  . PVC (premature ventricular contraction)   . PAC (premature atrial contraction)   . Diabetes mellitus without complication   . Anxiety   . Depression   . Blood clot in vein     pt sts" it may have been in artery and not vein" of right leg  . GERD (gastroesophageal reflux disease)   . H/O hiatal hernia   . Barrett's esophagus   . Arthritis   . Neuropathy   . Herniated disc, cervical     c5 and c6 and c6 and c7  . Hypertension   . Kidney failure     stage 4 just dx 4 months ago.Sees Dr Elby Beck, nephrologist in [place] [phone].  .  . 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 Diabetic retinopathy of both eyes, associated with type 2 diabetes mellitus   . Osteomyelitis of right foot 06/17/2015    S/p debridement, Dr.  McKinney  . Diabetic foot infection 06/16/2015    S/p debridement and wound VAC  . Sepsis      Past Surgical History  Procedure Laterality Date  . Wrist arthroscopy      left with no break reapir of tendond and ligamants  . Toe amputation      5th digit right foot-[place]  . Debridement leg      lower leg-left  . Metatarsal head excision Right 02/14/2013    Procedure: 4TH METATARSAL HEAD RESECTION RIGHT FOOT;  Surgeon: Dallas Schimke, DPM;  Location: [facility];  Service: Orthopedics;  Laterality: Right;  latex allergy  . Vitrectomy Right   . Application of wound vac Right 06/19/2015     Procedure: APPLICATION OF WOUND VAC;  Surgeon: [name], DPM;  Location: [facility];  Service: Podiatry;  Laterality: Right;  . Irrigation and debridement foot Right 06/19/2015    Procedure: IRRIGATION AND DEBRIDEMENT FOOT ;  Surgeon: [name], DPM;  Location: [facility];  Service: Podiatry;  Laterality: Right;  . Wound debridement Right 07/10/2015    Procedure: DEBRIDEMENT WOUND SUBCUTANEOUS TISSUE, MUSCLE, BONE RIGHT FOOT;  Surgeon: [name], DPM;  Location: [facility];  Service: Podiatry;  Laterality: Right;  . Application of wound vac Right 07/10/2015    Procedure: APPLICATION OF WOUND VAC;  Surgeon: [name], DPM;  Location: [facility];  Service: Podiatry;  Laterality: Right;      Prior to Admission medications   Medication Sig Start Date End Date Taking? Authorizing Provider  ALPRAZolam Prudy Feeler) 0.5 MG tablet Take 0.5 mg by mouth 3 (three) times daily.    Historical Provider, MD  clindamycin (CLEOCIN) 300 MG capsule Take 1 capsule (300 mg total) by mouth 3 (three) times daily. Antibiotic to be taken for 4 more weeks. 07/11/15   Erick Blinks, MD  cyclobenzaprine (FLEXERIL) 10 MG tablet Take 10 mg by mouth 2 (two) times daily.    Historical Provider, MD  furosemide (LASIX) 20 MG tablet Take 1 tablet (20 mg total) by mouth daily as needed for fluid. 07/11/15   Erick Blinks, MD  insulin glargine (LANTUS) 100 UNIT/ML injection Inject 42 Units into the skin at bedtime.     Historical Provider, MD  iron polysaccharides (NIFEREX) 150 MG capsule Take 1 capsule (150 mg total) by mouth 2 (two) times daily. 07/11/15   Erick Blinks, MD  metoprolol (LOPRESSOR) 50 MG tablet Take 50 mg by mouth 2 (two) times daily.    Historical Provider, MD  mometasone (NASONEX) 50 MCG/ACT nasal spray Place 2 sprays into the nose daily as needed (for allergies).     Historical Provider, MD  NOVOLOG FLEXPEN 100 UNIT/ML FlexPen Inject 0-75 Units into the skin 3 (three) times daily after meals.  06/02/15    Historical Provider, MD  omega-3 acid ethyl esters (LOVAZA) 1 G capsule Take 1,000 mg by mouth 2 (two) times daily. 05/06/15   Historical Provider, MD  omeprazole (PRILOSEC) 40 MG capsule Take 40 mg by mouth daily.    Historical Provider, MD  oxycodone (ROXICODONE) 30 MG immediate release tablet Take 30 mg by mouth 3 (three) times daily.     Historical Provider, MD  Vilazodone HCl (VIIBRYD) 20 MG TABS Take 20 mg by mouth daily.    Historical Provider, MD     Allergies  Allergen Reactions  . Daptomycin     Hypotension   . Bactrim [Sulfamethoxazole-Trimethoprim] Other (See Comments)    Cannot take due to kidney issue  . Dapsone  Nausea And Vomiting  . Latex Other (See Comments)    Blisters.   . Penicillins Hives  . Vancomycin Itching    Social History:  reports that he quit smoking about 7 weeks ago. His smoking use included Cigarettes. He has a 5 pack-year smoking history. His smokeless tobacco use includes Chew. He reports that he drinks alcohol. He reports that he does not use illicit drugs.    Family History  Problem Relation Age of Onset  . Cancer Mother     liver lung  . Cancer Father     liver  . Diabetes Mother   . Diabetes Father        Physical Exam:  GEN:  Pleasant Obese  39 y.o. Caucasian male examined and in no acute distress; cooperative with exam Filed Vitals:   08/05/15 2254  BP: 123/87  Pulse: 106  Temp: 98.4 F (36.9 C)  TempSrc: Oral  Resp: 20  Height:  (1.905 m)  Weight: 112.492 kg (248 lb)  SpO2: 95%   Blood pressure 123/87, pulse 106, temperature 98.4 F (36.9 C), temperature source Oral, resp. rate 20, height  (1.905 m), weight 112.492 kg (248 lb), SpO2 95 %. PSYCH: He is alert and oriented x4; does not appear anxious does not appear depressed; affect is normal HEENT: Normocephalic and Atraumatic, Mucous membranes pink; PERRLA; EOM intact; Fundi:  Benign;  No scleral icterus, Nares: Patent, Oropharynx: Clear, Fair Dentition,    Neck:   FROM, No Cervical Lymphadenopathy nor Thyromegaly or Carotid Bruit; No JVD; Breasts:: Not examined CHEST WALL: No tenderness CHEST: Normal respiration, clear to auscultation bilaterally HEART: Regular rate and rhythm; no murmurs rubs or gallops BACK: No kyphosis or scoliosis; No CVA tenderness ABDOMEN: Positive Bowel Sounds, Soft Non-Tender, No Rebound or Guarding; No Masses, No Organomegaly Rectal Exam: Not done EXTREMITIES: No Cyanosis, Clubbing, or Edema; +Numerous Ulcerations on dorsum and lateral Right Foot,  Genitalia: not examined PULSES: 2+ and symmetric SKIN: Normal hydration no rash, + Necrotic Areas on dorsum of right Foot, + Ulceration on Lateral right Foot  CNS:  Alert and Oriented x 4, No Focal Deficits Vascular: pulses palpable throughout    Labs on Admission:  Basic Metabolic Panel:  Recent Labs Lab 08/05/15 2345  NA 123*  K 5.1  CL 92*  CO2 21*  GLUCOSE 380*  BUN 61*  CREATININE 5.97*  CALCIUM 8.9   Liver Function Tests: No results for input(s): AST, ALT, ALKPHOS, BILITOT, PROT, ALBUMIN in the last 168 hours. No results for input(s): LIPASE, AMYLASE in the last 168 hours. No results for input(s): AMMONIA in the last 168 hours. CBC:  Recent Labs Lab 08/05/15 2345  WBC 23.7*  NEUTROABS 19.5*  HGB 8.1*  HCT 24.1*  MCV 82.3  PLT 369   Cardiac Enzymes: No results for input(s): CKTOTAL, CKMB, CKMBINDEX, TROPONINI in the last 168 hours.  BNP (last 3 results) No results for input(s): BNP in the last 8760 hours.  ProBNP (last 3 results) No results for input(s): PROBNP in the last 8760 hours.  CBG: No results for input(s): GLUCAP in the last 168 hours.  Radiological Exams on Admission: Dg Foot Complete Right  08/06/2015   CLINICAL DATA:  Ulcers in the right foot for several months. No injury. History of diabetes. Right foot pain.  EXAM: RIGHT FOOT COMPLETE - 3+ VIEW  COMPARISON:  07/10/2015  FINDINGS: Examination is technically limited due to patient  positioning. There has been previous resection of the fifth toe  and metatarsal. Previous resection of the distal fourth metatarsal head. Since the previous study, there is interval development of soft tissue swelling diffusely throughout the right midfoot with superior skin ulceration and multiple soft tissue gas collections consistent with infection with gas-forming organism. There is probable bone destruction involving the distal tarsal bones and possibly involving the proximal metatarsals. Findings suggest probable osteomyelitis. Vascular calcifications.  IMPRESSION: Diffuse soft tissue swelling with subcutaneous emphysema consistent with infection with gas-forming organism. Bone destruction and sclerosis involving the distal tarsal bones and probably the proximal metatarsal bones is suspicious for osteomyelitis and possibly joint space infection. Previous amputations.  These results were called by telephone at the time of interpretation on 08/06/2015 at 12:25 am to Dr. Effie Shy, who verbally acknowledged these results.   Electronically Signed   By: Burman Nieves M.D.   On: 08/06/2015 00:28      Assessment/Plan:      39 y.o. male with  Principal Problem:   1.    Osteomyelitis of right foot   IV Clindamycin   Consider Repeat MRI of right Foot in AM   Consult Dr [name] per Patient Request   Active Problems:   2.    Diabetic foot infection/Diabetic ulcer of right foot   IV Clindamycin    3.    Hyponatremia- Na+ =123;  Due to ARF, and diuretic   Send Urine Electrolytes and Urine Osm in AM   Hold Lasix Rx   IVFs with NSS     4.   Leukocytosis- Due to #1 and #2;  WBC = 23k    Monitor Trend       5.    Diabetes mellitus with complication   Continue Lantus Insulin   SSI coverage PRN     6.    Acute renal failure superimposed on stage 4 chronic kidney disease- BUN/CR   61/5.97   IVFs   Monitor BUN/Cr   Notify Renal Dr Fausto Skillern in AM     7    Anemia, chronic disease- HB= 8.1  MCV =   82.3   Monitor Trend   Continue Iron supplement     8.    Essential hypertension   Continue Metoprolol   Hold Lasix overnight   Monitor BPs     9.    DVT Prophylaxis   SCDs (in event of a Surgical Procedure)     Code Status:     FULL CODE       Family Communication:   Wife at Bedside     Disposition Plan:    Inpatient Status        Time spent: 60 Minutes  Ron Parker [facility] Pager [phone]   If 7AM -7PM Please Contact the Day Rounding Team MD for [facility]  If 7PM-7AM, Please Contact Night-Floor Coverage  [email or web address] Password [email or web address] 08/06/2015, 12:59 AM     ADDENDUM:   Patient was seen and examined on 08/06/2015

## 2015-08-07 LAB — GLUCOSE, CAPILLARY
GLUCOSE-CAPILLARY: 161 mg/dL — AB (ref 65–99)
Glucose-Capillary: 152 mg/dL — ABNORMAL HIGH (ref 65–99)
Glucose-Capillary: 204 mg/dL — ABNORMAL HIGH (ref 65–99)
Glucose-Capillary: 183 mg/dL — ABNORMAL HIGH (ref 65–99)

## 2015-08-07 LAB — BASIC METABOLIC PANEL
ANION GAP: 9 (ref 5–15)
BUN: 60 mg/dL — ABNORMAL HIGH (ref 6–20)
CALCIUM: 8.3 mg/dL — AB (ref 8.9–10.3)
CO2: 20 mmol/L — AB (ref 22–32)
CREATININE: 5.36 mg/dL — AB (ref 0.61–1.24)
Chloride: 98 mmol/L — ABNORMAL LOW (ref 101–111)
GFR calc Af Amer: 14 mL/min — ABNORMAL LOW (ref >60)
GFR, EST NON AFRICAN AMERICAN: 12 mL/min — AB (ref >60)
GLUCOSE: 168 mg/dL — AB (ref 65–99)
Potassium: 4.3 mmol/L (ref 3.5–5.1)
Sodium: 127 mmol/L — ABNORMAL LOW (ref 135–145)

## 2015-08-07 LAB — NA AND K (SODIUM & POTASSIUM), RAND UR
POTASSIUM UR: 23 mmol/L
SODIUM UR: 87 mmol/L

## 2015-08-07 LAB — CBC
HCT: 25.2 % — ABNORMAL LOW (ref 39.0–52.0)
HEMOGLOBIN: 8.6 g/dL — AB (ref 13.0–17.0)
MCH: 27.9 pg (ref 26.0–34.0)
MCHC: 34.1 g/dL (ref 30.0–36.0)
MCV: 81.8 fL (ref 78.0–100.0)
PLATELETS: 381 10*3/uL (ref 150–400)
RBC: 3.08 MIL/uL — ABNORMAL LOW (ref 4.22–5.81)
RDW: 14.6 % (ref 11.5–15.5)
WBC: 22.6 10*3/uL — ABNORMAL HIGH (ref 4.0–10.5)

## 2015-08-07 LAB — PHOSPHORUS: PHOSPHORUS: 6 mg/dL — AB (ref 2.5–4.6)

## 2015-08-07 LAB — PREPARE RBC (CROSSMATCH)

## 2015-08-07 MED ORDER — DEXTROSE 5 % IV SOLN
1.0000 g | Freq: Three times a day (TID) | INTRAVENOUS | Status: DC
  Administered 2015-08-07 – 2015-08-13 (×17): 1 g via INTRAVENOUS
  Filled 2015-08-07 (×22): qty 1

## 2015-08-07 MED ORDER — FUROSEMIDE 10 MG/ML IJ SOLN
80.0000 mg | Freq: Two times a day (BID) | INTRAMUSCULAR | Status: DC
  Administered 2015-08-07: 80 mg via INTRAVENOUS
  Filled 2015-08-07 (×3): qty 8

## 2015-08-07 MED ORDER — INFLUENZA VAC SPLIT QUAD 0.5 ML IM SUSY
0.5000 mL | PREFILLED_SYRINGE | INTRAMUSCULAR | Status: AC
  Administered 2015-08-09: 0.5 mL via INTRAMUSCULAR
  Filled 2015-08-07: qty 0.5

## 2015-08-07 MED ORDER — SODIUM CHLORIDE 0.9 % IV SOLN
Freq: Once | INTRAVENOUS | Status: AC
  Administered 2015-08-07: 500 mL via INTRAVENOUS

## 2015-08-07 MED ORDER — CALCIUM ACETATE (PHOS BINDER) 667 MG PO CAPS
667.0000 mg | ORAL_CAPSULE | Freq: Three times a day (TID) | ORAL | Status: DC
  Administered 2015-08-07 – 2015-08-13 (×15): 667 mg via ORAL
  Filled 2015-08-07 (×17): qty 1

## 2015-08-07 NOTE — Progress Notes (Signed)
ANTIBIOTIC CONSULT NOTE - INITIAL  Pharmacy Consult for Aztreonam Indication: osteomyelitis, diabetic foot infection  Allergies  Allergen Reactions  . Daptomycin     Hypotension   . Bactrim [Sulfamethoxazole-Trimethoprim] Other (See Comments)    Cannot take due to kidney issue  . Dapsone Nausea And Vomiting  . Latex Other (See Comments)    Blisters.   . Penicillins Hives  . Vancomycin Itching   Patient Measurements: Height:  (190.5 cm) Weight: 226 lb 1.6 oz (102.558 kg) IBW/kg (Calculated) : 84.5  Vital Signs: Temp: 98.3 F (36.8 C) (09/09 0631) Temp Source: Oral (09/09 0631) BP: 111/68 mmHg (09/09 0631) Pulse Rate: 84 (09/09 0631) Intake/Output from previous day: 09/08 0701 - 09/09 0700 In: 2766.3 [P.O.:720; I.V.:1826.3; IV Piggyback:220] Out: -  Intake/Output from this shift:    Labs:  Recent Labs  08/05/15 2345 08/06/15 0614 08/07/15 0611  WBC 23.7* 18.3* 22.6*  HGB 8.1* 6.8* 8.6*  PLT 369 360 381  CREATININE 5.97* 5.74* 5.36*   Estimated Creatinine Clearance: 23.8 mL/min (by C-G formula based on Cr of 5.36). No results for input(s): VANCOTROUGH, VANCOPEAK, VANCORANDOM, GENTTROUGH, GENTPEAK, GENTRANDOM, TOBRATROUGH, TOBRAPEAK, TOBRARND, AMIKACINPEAK, AMIKACINTROU, AMIKACIN in the last 72 hours.   Microbiology: Recent Results (from the past 720 hour(s))  Culture, blood (x 2)     Status: None   Collection Time: 07/08/15 10:57 AM  Result Value Ref Range Status   Specimen Description BLOOD LEFT ANTECUBITAL  Final   Special Requests   Final    BOTTLES DRAWN AEROBIC AND ANAEROBIC AEB=10CC ANA=6CC   Culture NO GROWTH 5 DAYS  Final   Report Status 07/13/2015 FINAL  Final  Culture, blood (x 2)     Status: None   Collection Time: 07/08/15 11:10 AM  Result Value Ref Range Status   Specimen Description BLOOD LEFT ARM  Final   Special Requests   Final    BOTTLES DRAWN AEROBIC AND ANAEROBIC AEB=8CC ANA=5CC   Culture NO GROWTH 5 DAYS  Final   Report Status  07/13/2015 FINAL  Final  Surgical pcr screen     Status: None   Collection Time: 07/09/15  9:15 PM  Result Value Ref Range Status   MRSA, PCR NEGATIVE NEGATIVE Final   Staphylococcus aureus NEGATIVE NEGATIVE Final    Comment:        The Xpert SA Assay (FDA approved for NASAL specimens in patients over 32 years of age), is one component of a comprehensive surveillance program.  Test performance has been validated by [facility] for patients greater than or equal to 82 year old. It is not intended to diagnose infection nor to guide or monitor treatment.   Wound culture     Status: None   Collection Time: 07/10/15 10:44 AM  Result Value Ref Range Status   Specimen Description TOE RIGHT FIFTH  Final   Special Requests NONE  Final   Gram Stain   Final    NO WBC SEEN RARE SQUAMOUS EPITHELIAL CELLS PRESENT FEW GRAM POSITIVE COCCI IN PAIRS Performed at [facility]    Culture   Final    FEW GROUP B STREP(S.AGALACTIAE)ISOLATED Note: TESTING AGAINST S. AGALACTIAE NOT ROUTINELY PERFORMED DUE TO PREDICTABILITY OF AMP/PEN/VAN SUSCEPTIBILITY. Performed at [facility]    Report Status 07/13/2015 FINAL  Final  Anaerobic culture     Status: None   Collection Time: 07/10/15 10:55 AM  Result Value Ref Range Status   Specimen Description WOUND RIGHT FIFTH TOE  Final  Special Requests TEFLANO 300mg   Final   Gram Stain   Final    NO WBC SEEN RARE SQUAMOUS EPITHELIAL CELLS PRESENT FEW GRAM POSITIVE COCCI IN PAIRS Performed at [facility]    Culture   Final    NO ANAEROBES ISOLATED Performed at [facility]    Report Status 07/16/2015 FINAL  Final  Blood culture (routine x 2)     Status: None (Preliminary result)   Collection Time: 08/06/15 12:50 AM  Result Value Ref Range Status   Specimen Description BLOOD RIGHT ANTECUBITAL  Final   Special Requests   Final    BOTTLES DRAWN AEROBIC AND ANAEROBIC AEB=8CC ANA=6CC   Culture NO GROWTH 1 DAY  Final    Report Status PENDING  Incomplete  Blood culture (routine x 2)     Status: None (Preliminary result)   Collection Time: 08/06/15  1:03 AM  Result Value Ref Range Status   Specimen Description BLOOD LEFT ANTECUBITAL  Final   Special Requests   Final    BOTTLES DRAWN AEROBIC AND ANAEROBIC AEB 6CC ANA 10CC   Culture PENDING  Incomplete   Report Status PENDING  Incomplete    Medical History: Past Medical History  Diagnosis Date  . PVC (premature ventricular contraction)   . PAC (premature atrial contraction)   . Diabetes mellitus without complication   . Anxiety   . Depression   . Blood clot in vein     pt sts" it may have been in artery and not vein" of right leg  . GERD (gastroesophageal reflux disease)   . H/O hiatal hernia   . Barrett's esophagus   . Arthritis   . Neuropathy   . Herniated disc, cervical     c5 and c6 and c6 and c7  . Hypertension   . Kidney failure     stage 4 just dx 4 months ago.Sees Dr Elby Beck, nephrologist in [place] [phone].  .  . 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 Diabetic retinopathy of both eyes, associated with type 2 diabetes mellitus   . Osteomyelitis of right foot 06/17/2015    S/p debridement, Dr. [name]  . Diabetic foot infection 06/16/2015    S/p debridement and wound VAC  . Sepsis    Anti-infectives    Start     Dose/Rate Route Frequency Ordered Stop   08/07/15 0900  aztreonam (AZACTAM) 1 g in dextrose 5 % 50 mL IVPB     1 g 100 mL/hr over 30 Minutes Intravenous Every 8 hours 08/07/15 0740     08/06/15 1000  clindamycin (CLEOCIN) IVPB 600 mg     600 mg 100 mL/hr over 30 Minutes Intravenous Every 8 hours 08/06/15 0922     08/06/15 0045  clindamycin (CLEOCIN) IVPB 600 mg     600 mg 100 mL/hr over 30 Minutes Intravenous  Once 08/06/15 0036 08/06/15 0202     Assessment: 38yo male admitted with osteomyelitis of right foot and will need BKA.  SCr elevated.  Pt has been started on Clindamycin  on admission, asked to add Aztreonam. Estimated Creatinine Clearance: 23.8 mL/min (by C-G formula based on Cr of 5.36).  Goal of Therapy:  Eradicate infection.  Plan:  Aztreonam 1gm IV q8h Continue clindamycin per MD (600mg  IV q8h) Monitor labs, renal fxn, progress, and c/s  Margo Aye, Tashanna Dolin A 08/07/2015,7:42 AM

## 2015-08-07 NOTE — Plan of Care (Signed)
Skin/ neuro Assessment:  Patient has a darken color to his skin.  His has scratches on his arms which he says is from his cats.  He also has generalized brusing on his arms and his legs.  His left foot is cool to touch and he states due to his neuropathy he can feel that I am touching his extremities, but have no sensation.  He does have a +2 popliteal pulse on the left I was unable to assess pedal due to bulky dressing.  It is a +2 on right pedal.  The wound does have an odor that is foul.  New orders were given from Dr. Macario Carls to do a wet to dry dressing to the left extremity throughout the weekend.

## 2015-08-07 NOTE — Progress Notes (Signed)
Subjective: Patient offers no complaints. Presently he denies any difficulty in breathing. His appetite is good.   Objective: Vital signs in last 24 hours: Temp:  [97.5 F (36.4 C)-98.4 F (36.9 C)] 98.3 F (36.8 C) (09/09 0631) Pulse Rate:  [71-84] 84 (09/09 0631) Resp:  [18-20] 20 (09/09 0631) BP: (95-140)/(54-80) 111/68 mmHg (09/09 0631) SpO2:  [100 %] 100 % (09/09 0631)  Intake/Output from previous day: 09/08 0701 - 09/09 0700 In: 2766.3 [P.O.:720; I.V.:1826.3; IV Piggyback:220] Out: -  Intake/Output this shift:     Recent Labs  08/05/15 2345 08/06/15 0614 08/07/15 0611  HGB 8.1* 6.8* 8.6*    Recent Labs  08/06/15 0614 08/07/15 0611  WBC 18.3* 22.6*  RBC 2.45* 3.08*  HCT 20.0* 25.2*  PLT 360 381    Recent Labs  08/06/15 0614 08/07/15 0611  NA 125* 127*  K 4.5 4.3  CL 96* 98*  CO2 22 20*  BUN 59* 60*  CREATININE 5.74* 5.36*  GLUCOSE 293* 168*  CALCIUM 8.5* 8.3*    Recent Labs  08/05/15 2345  INR 1.40    Generally patient is alert and in no apparent distress Chest is clear to auscultation Heart exam revealed regular rate and rhythm no murmur Extremities he has trace edema on the left and possibly 1+ edema on the right.  Assessment/Plan: Problem #1 acute kidney injury superimposed on chronic. His renal function his improving very slowly. His creatinine is still above his baseline. Patient is asymptomatic. Problem #2 chronic renal failure: Stage IV Problem #3 hyponatremia: His sodium is 127 and improving. Problem #4 anemia: His status post blood transfusion. His hemoglobin has improved. Problem #5 diabetes Problem #6 hypertension: His blood pressure is reasonably controlled Problem #7 metabolic bone disease: His calcium is range but his phosphorus is slightly high. Problem #8 osteomyelitis of his right foot: Presently he is on anti-biotics and patient might have surgery on Monday. Plan: 1]We'll continue his present management 2]We'll start  patient on PhosLo 1 tablet by mouth 3 times a day with meals 3]We'll check his basic metabolic panel and sodium in the morning.   Aireal Slater S 08/07/2015, 9:24 AM

## 2015-08-07 NOTE — Care Management Note (Signed)
Case Management Note  Patient Details  Name: ISSACHAR BROADY MRN: [id number] Date of Birth: 08-11-1976  Expected Discharge Date:  08/10/15               Expected Discharge Plan:  Home w Home Health Services  In-House Referral:  NA  Discharge planning Services  CM Consult  Post Acute Care Choice:  Resumption of Svcs/PTA Provider Choice offered to:  Patient  DME Arranged:    DME Agency:     HH Arranged:  RN HH Agency:  [facility]  Status of Service:  In process, will continue to follow  Medicare Important Message Given:    Date Medicare IM Given:    Medicare IM give by:    Date Additional Medicare IM Given:    Additional Medicare Important Message give by:     If discussed at Long Length of Stay Meetings, dates discussed:    Additional Comments: Admitted with osteo of foot, anticipate need for amputation. Pt active with [facility]. Will resume HH at DC. Surgery possible next week. Will cont to follow for DC planning.  Malcolm Metro, RN 08/07/2015, 3:36 PM

## 2015-08-07 NOTE — Care Management Important Message (Signed)
Important Message  Patient DetaiDEQUANN VANDERVELDENJohn S Leverich MRN: [id number] Date of Birth: 1976/01/09   Medicare Important Message Given:  Yes-second notification given    Malcolm Metro, RN 08/07/2015, 3:37 PM

## 2015-08-07 NOTE — Progress Notes (Signed)
Podiatry Progress Note  Subjective:  Maurice [name] is a 39 y.o. male being followed for osteomyelitis of the right foot.  Dr. Lovell Sheehan is following this patient.  A below knee amputation is planned for Monday.   Past Medical History  Diagnosis Date  . PVC (premature ventricular contraction)   . PAC (premature atrial contraction)   . Diabetes mellitus without complication   . Anxiety   . Depression   . Blood clot in vein     pt sts" it may have been in artery and not vein" of right leg  . GERD (gastroesophageal reflux disease)   . H/O hiatal hernia   . Barrett's esophagus   . Arthritis   . Neuropathy   . Herniated disc, cervical     c5 and c6 and c6 and c7  . Hypertension   . Kidney failure     stage 4 just dx 4 months ago.Sees Dr Elby Beck, nephrologist in [place] [phone].  .  . 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 Diabetic retinopathy of both eyes, associated with type 2 diabetes mellitus   . Osteomyelitis of right foot 06/17/2015    S/p debridement, Dr. [name]  . Diabetic foot infection 06/16/2015    S/p debridement and wound VAC  . Sepsis    Scheduled Meds: . sodium chloride   Intravenous Once  . ALPRAZolam  0.5 mg Oral TID  . aztreonam  1 g Intravenous Q8H  . calcium acetate  667 mg Oral TID WC  . clindamycin (CLEOCIN) IV  600 mg Intravenous Q8H  . furosemide  80 mg Intravenous BID  . [START ON 08/08/2015] Influenza vac split quadrivalent PF  0.5 mL Intramuscular Tomorrow-1000  . insulin aspart  0-20 Units Subcutaneous TID WC  . insulin aspart  0-5 Units Subcutaneous QHS  . insulin glargine  42 Units Subcutaneous QHS  . iron polysaccharides  150 mg Oral BID  . metoprolol  50 mg Oral BID  . omega-3 acid ethyl esters  1,000 mg Oral BID  . pantoprazole  40 mg Oral Daily  . Vilazodone HCl  20 mg Oral Daily   Continuous Infusions: . sodium chloride 75 mL/hr at 08/07/15 0259   PRN Meds:.acetaminophen **OR** acetaminophen, alum  & mag hydroxide-simeth, HYDROmorphone (DILAUDID) injection, ondansetron **OR** ondansetron (ZOFRAN) IV, oxyCODONE  Allergies  Allergen Reactions  . Daptomycin     Hypotension   . Bactrim [Sulfamethoxazole-Trimethoprim] Other (See Comments)    Cannot take due to kidney issue  . Dapsone Nausea And Vomiting  . Latex Other (See Comments)    Blisters.   . Penicillins Hives  . Vancomycin Itching   Past Surgical History  Procedure Laterality Date  . Wrist arthroscopy      left with no break reapir of tendond and ligamants  . Toe amputation      5th digit right foot-[place]  . Debridement leg      lower leg-left  . Metatarsal head excision Right 02/14/2013    Procedure: 4TH METATARSAL HEAD RESECTION RIGHT FOOT;  Surgeon: Dallas Schimke, DPM;  Location: [facility];  Service: Orthopedics;  Laterality: Right;  latex allergy  . Vitrectomy Right   . Application of wound vac Right 06/19/2015    Procedure: APPLICATION OF WOUND VAC;  Surgeon: [name], DPM;  Location: [facility];  Service: Podiatry;  Laterality: Right;  . Irrigation and debridement foot Right 06/19/2015    Procedure: IRRIGATION AND DEBRIDEMENT  FOOT ;  Surgeon: [name], DPM;  Location: [facility];  Service: Podiatry;  Laterality: Right;  . Wound debridement Right 07/10/2015    Procedure: DEBRIDEMENT WOUND SUBCUTANEOUS TISSUE, MUSCLE, BONE RIGHT FOOT;  Surgeon: [name], DPM;  Location: [facility];  Service: Podiatry;  Laterality: Right;  . Application of wound vac Right 07/10/2015    Procedure: APPLICATION OF WOUND VAC;  Surgeon: [name], DPM;  Location: [facility];  Service: Podiatry;  Laterality: Right;   Family History  Problem Relation Age of Onset  . Cancer Mother     liver lung  . Cancer Father     liver  . Diabetes Mother   . Diabetes Father    Social History:  reports that he quit smoking about 7 weeks ago. His smoking use included Cigarettes. He has a 5 pack-year smoking history. His  smokeless tobacco use includes Chew. He reports that he drinks alcohol. He reports that he does not use illicit drugs.  Review of Systems: Constitutional:  Denies recent fever or vomiting.  He has experienced some chills and nausea. Integument:  Nonhealing ulceration of the right foot. Cardiovascular:  Edema of the right lower extremity. Neurologic:  Numbness of both feet.  Physical Examination: Vital signs in last 24 hours:   Temp:  [97.5 F (36.4 C)-98.4 F (36.9 C)] 98.4 F (36.9 C) (09/09 1039) Pulse Rate:  [71-92] 92 (09/09 1039) Resp:  [18-20] 18 (09/09 1039) BP: (107-140)/(56-80) 114/56 mmHg (09/09 1039) SpO2:  [95 %-100 %] 95 % (09/09 1039)  GENERAL:  Alert. INTEGUMENT: Skin of the right foot is warm with a purple discoloration.  The skin of the right lower leg is brawny.  There is a full-thickness ulceration / wound along the plantar lateral aspect of the right foot.  The majority of the wound bed is comprised of necrotic, fibrotic and necrotic tissue.  There is an open wound along the dorsal aspect of the right midfoot with a fibrotic base.  Tendons are exposed.  There is an eschar present along the anterior aspect of the right ankle .  There is erythema extends just just proximal to the ankle.   VASCULAR:  DP is palpable bilaterally.  PT is palpable on the left and obscurred by edema on the right.  There is edema both lower extremities.  This is significantly more pronounced on the right and left.  Lab/Test Results:    Recent Labs  08/06/15 0614 08/07/15 0611  WBC 18.3* 22.6*  HGB 6.8* 8.6*  HCT 20.0* 25.2*  PLT 360 381  NA 125* 127*  K 4.5 4.3  CL 96* 98*  CO2 22 20*  BUN 59* 60*  CREATININE 5.74* 5.36*  GLUCOSE 293* 168*  CALCIUM 8.5* 8.3*    Recent Results (from the past 240 hour(s))  Blood culture (routine x 2)     Status: None (Preliminary result)   Collection Time: 08/06/15 12:50 AM  Result Value Ref Range Status   Specimen Description BLOOD RIGHT  ANTECUBITAL  Final   Special Requests   Final    BOTTLES DRAWN AEROBIC AND ANAEROBIC AEB=8CC ANA=6CC   Culture NO GROWTH 1 DAY  Final   Report Status PENDING  Incomplete  Blood culture (routine x 2)     Status: None (Preliminary result)   Collection Time: 08/06/15  1:03 AM  Result Value Ref Range Status   Specimen Description BLOOD LEFT ANTECUBITAL  Final   Special Requests   Final    BOTTLES DRAWN AEROBIC  AND ANAEROBIC AEB 6CC ANA 10CC   Culture PENDING  Incomplete   Report Status PENDING  Incomplete     Mr Foot Right Wo Contrast  08/06/2015   CLINICAL DATA:  Acute right foot pain. Multiple surgeries to the right foot.  EXAM: MRI OF THE RIGHT FOREFOOT WITHOUT CONTRAST  TECHNIQUE: Multiplanar, multisequence MR imaging was performed. No intravenous contrast was administered.  COMPARISON:  X-ray 08/05/2015, MR foot 06/16/2015  FINDINGS: There has been interval resection of the fifth metatarsal stump. There is a soft tissue wound along the dorsal aspect of the midfoot and lateral aspect of the foot. There is extensive soft tissue air in the midfoot. There is cortical irregularity involving the cuboid and lateral cuneiform with severe T1 signal abnormality most concerning for osteomyelitis. There is cortical irregularity with marrow edema and T1 hypointensity at the lateral base of the fourth metatarsal. There is a nondisplaced fracture of the bases of the first, second and third metatarsals with severe marrow edema. There is marrow edema in the medial and middle cuneiform with cortical irregularity and corresponding T1 hypointensity. There is minimal marrow edema in the navicular without cortical destruction. There is no drainable fluid collection.  IMPRESSION: 1. Soft tissue wound along the dorsal aspect of the midfoot and lateral aspect of the midfoot with extensive soft tissue air. There is cortical irregularity with corresponding T1 hypointensity involving the cuboid, lateral cuneiform and lateral  base of the fourth metatarsal most concerning for osteomyelitis. 2. Cortical irregularity with marrow edema involving the medial and middle cuneiforms also concerning for osteomyelitis. 3. Nondisplaced fractures of the bases of the first, second and third metatarsals with severe marrow edema with adjacent soft tissue wound and air. 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 4. Mild edema in the navicular which may be reactive versus early osteomyelitis. No definite bone destruction.   Electronically Signed   By: Elige Ko   On: 08/06/2015 08:40   [place] Lower Ext Art Right Ltd  08/06/2015   CLINICAL DATA:  Preoperative evaluation for below-knee amputation common known history of distal open wounds on the right  EXAM: UNILATERAL right LOWER EXTREMITY ARTERIAL DUPLEX SCAN  TECHNIQUE: Gray-scale sonography as well as color Doppler and duplex ultrasound was performed to evaluate the arteries of the lower extremity.  COMPARISON:  None.  FINDINGS: Common femoral artery is widely patent. The femoral bifurcation is patent as well. The superficial femoral artery and profunda femoral arteries are within normal limits with normal waveforms.  Plaque formation is noted within the popliteal artery distally with decreased flow and elevated velocities in the distal aspect of the popliteal artery. The proximal infrapopliteal vessels are only partially visualized with monophasic waveforms identified.  IMPRESSION: Plaque formation in the popliteal artery decreased peripheral flow. The superficial femoral and common femoral arteries are within normal limits.   Electronically Signed   By: Alcide Clever M.D.   On: 08/06/2015 15:05   Dg Foot Complete Right  08/06/2015   CLINICAL DATA:  Ulcers in the right foot for several months. No injury. History of diabetes. Right foot pain.  EXAM: RIGHT FOOT COMPLETE - 3+ VIEW  COMPARISON:  07/10/2015  FINDINGS:  Examination is technically limited due to patient positioning. There has been previous resection of the fifth toe and metatarsal. Previous resection of the distal fourth metatarsal head. Since the previous study, there is interval development of soft tissue swelling diffusely throughout the right midfoot with  superior skin ulceration and multiple soft tissue gas collections consistent with infection with gas-forming organism. There is probable bone destruction involving the distal tarsal bones and possibly involving the proximal metatarsals. Findings suggest probable osteomyelitis. Vascular calcifications.  IMPRESSION: Diffuse soft tissue swelling with subcutaneous emphysema consistent with infection with gas-forming organism. Bone destruction and sclerosis involving the distal tarsal bones and probably the proximal metatarsal bones is suspicious for osteomyelitis and possibly joint space infection. Previous amputations.  These results were called by telephone at the time of interpretation on 08/06/2015 at 12:25 am to Dr. Effie Shy, who verbally acknowledged these results.   Electronically Signed   By: Burman Nieves M.D.   On: 08/06/2015 00:28    Assessment: 1.  Osteomyelitis, right foot. 2.  Cellulitis, right lower extremity. 3.  Diabetes mellitus with peripheral neuropathy.  Plan: A wet to dressing was applied to the right foot.  Nursing staff will change Saturday and Sunday.  All further care of his right lower extremity has been deferred to Dr. Lovell Sheehan.  I appreciate his assistance.  I will follow Mr. Tufo for ongoing at risk diabetic foot care of his left foot upon discharge.  Garlene Apperson IVAN 08/07/2015, 12:54 PM

## 2015-08-07 NOTE — Progress Notes (Signed)
[facility] PROGRESS NOTE  Maurice [name] ZOX:[id number] DOB: [birth year] DOA: 08/05/2015 PCP: PROVIDER NOT IN SYSTEM  Assessment/Plan: 1. Osteomyelitis of right foot. MRI foot show extensive changes consistent with osteomyelitis. Continue on IV clindamycin and aztreonam. Per Dr. Lovell Sheehan, amputation scheduled for 08/10/15 pending improvement of other issues listed below. . 2. DM, type 2. CBG stable. Contnue SSI. 3. Acute renal failure, superimposed on stage 4 chronic kidney disease. Nephrology has evaluated the patient. He is currently on IVF and IV lasix. He reports good urine output although this has not been charted. Creatinine is trending down, will continue current treatments.  4. Leukocytosis. WBC elevated to 22.6 likely related to infections continue abx.  5. Hyponatremia. Currently on  IVF and lasix. Urine electrolytes and Osm. Sodium is trending up  6. Anemia, chronic disease. Presented with Hgb at 6.8. He was transfused 2 units PRBCs on 08/06/15 with improvement of Hgb to 8.6. Will transfuse another 2 units PRBC today in order to prepare for surgery. (Goal Hgb of 10/ hematocrit 30) Continue Iron supplement. Continue to monitor. 7. Essential Hypertension. Blood pressure currently stable, continue metoprolol.  8. GERD. Continue Protonix.   Code Status: Full DVT Prophylaxis: SCDs Family Communication: Discussed with patient who understands and has no concerns at this time. Disposition Plan: Discharge once improved   Consultants:  General surgery- Dr. Lovell Sheehan   Nephrology- Salomon Mast, MD  Podiatry-  [name], DPM  Procedures:  Transfused 4 unit prbc  Antibiotics:  Clindamycin 9/7>>  Aztreonam 9/9>>  HPI/Subjective :Noticed increased urine output. No reports of chest pain, shortness of breath, n/v/d, or abd pain.  Objective: Filed Vitals:   08/07/15 1039  BP: 114/56  Pulse: 92  Temp: 98.4 F (36.9 C)  Resp: 18    Intake/Output Summary (Last 24  hours) at 08/07/15 1138 Last data filed at 08/07/15 0656  Gross per 24 hour  Intake 2526.25 ml  Output      0 ml  Net 2526.25 ml   Filed Weights   08/05/15 2254 08/06/15 0314  Weight: 112.492 kg (248 lb) 102.558 kg (226 lb 1.6 oz)    Exam: General:  Appears comfortable, calm. Cardiovascular: Regular rate and rhythm, no murmur, rub or gallop. No lower extremity edema. Respiratory: Clear to auscultation bilaterally, no wheezes, rales or rhonchi. Normal respiratory effort. Abdomen: soft, ntnd Skin: no rash or induration  Musculoskeletal:Right foot has dressing in place that was not removed. Foul smell noted coming from right foot.  Psychiatric: Appears depressed   Data Reviewed: Basic Metabolic Panel:  Recent Labs Lab 08/05/15 2345 08/06/15 0614 08/07/15 0611  NA 123* 125* 127*  K 5.1 4.5 4.3  CL 92* 96* 98*  CO2 21* 22 20*  GLUCOSE 380* 293* 168*  BUN 61* 59* 60*  CREATININE 5.97* 5.74* 5.36*  CALCIUM 8.9 8.5* 8.3*  PHOS  --   --  6.0*   Liver Function Tests:  CBC:  Recent Labs Lab 08/05/15 2345 08/06/15 0614 08/07/15 0611  WBC 23.7* 18.3* 22.6*  NEUTROABS 19.5*  --   --   HGB 8.1* 6.8* 8.6*  HCT 24.1* 20.0* 25.2*  MCV 82.3 81.6 81.8  PLT 369 360 381   Cardiac Enzymes:  BNP (last 3 results)  ProBNP (last 3 results)  CBG:  Recent Labs Lab 08/06/15 0743 08/06/15 1141 08/06/15 1629 08/06/15 2037 08/07/15 0816  GLUCAP 263* 258* 152* 151* 152*    Recent Results (from the past 240 hour(s))  Blood culture (routine x 2)  Status: None (Preliminary result)   Collection Time: 08/06/15 12:50 AM  Result Value Ref Range Status   Specimen Description BLOOD RIGHT ANTECUBITAL  Final   Special Requests   Final    BOTTLES DRAWN AEROBIC AND ANAEROBIC AEB=8CC ANA=6CC   Culture NO GROWTH 1 DAY  Final   Report Status PENDING  Incomplete  Blood culture (routine x 2)     Status: None (Preliminary result)   Collection Time: 08/06/15  1:03 AM  Result Value  Ref Range Status   Specimen Description BLOOD LEFT ANTECUBITAL  Final   Special Requests   Final    BOTTLES DRAWN AEROBIC AND ANAEROBIC AEB 6CC ANA 10CC   Culture PENDING  Incomplete   Report Status PENDING  Incomplete     Studies: Mr Foot Right Wo Contrast  08/06/2015   CLINICAL DATA:  Acute right foot pain. Multiple surgeries to the right foot.  EXAM: MRI OF THE RIGHT FOREFOOT WITHOUT CONTRAST  TECHNIQUE: Multiplanar, multisequence MR imaging was performed. No intravenous contrast was administered.  COMPARISON:  X-ray 08/05/2015, MR foot 06/16/2015  FINDINGS: There has been interval resection of the fifth metatarsal stump. There is a soft tissue wound along the dorsal aspect of the midfoot and lateral aspect of the foot. There is extensive soft tissue air in the midfoot. There is cortical irregularity involving the cuboid and lateral cuneiform with severe T1 signal abnormality most concerning for osteomyelitis. There is cortical irregularity with marrow edema and T1 hypointensity at the lateral base of the fourth metatarsal. There is a nondisplaced fracture of the bases of the first, second and third metatarsals with severe marrow edema. There is marrow edema in the medial and middle cuneiform with cortical irregularity and corresponding T1 hypointensity. There is minimal marrow edema in the navicular without cortical destruction. There is no drainable fluid collection.  IMPRESSION: 1. Soft tissue wound along the dorsal aspect of the midfoot and lateral aspect of the midfoot with extensive soft tissue air. There is cortical irregularity with corresponding T1 hypointensity involving the cuboid, lateral cuneiform and lateral base of the fourth metatarsal most concerning for osteomyelitis. 2. Cortical irregularity with marrow edema involving the medial and middle cuneiforms also concerning for osteomyelitis. 3. Nondisplaced fractures of the bases of the first, second and third metatarsals with severe marrow  edema with adjacent soft tissue wound and air. 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 4. Mild edema in the navicular which may be reactive versus early osteomyelitis. No definite bone destruction.   Electronically Signed   By: Elige Ko   On: 08/06/2015 08:40   [place] Lower Ext Art Right Ltd  08/06/2015   CLINICAL DATA:  Preoperative evaluation for below-knee amputation common known history of distal open wounds on the right  EXAM: UNILATERAL right LOWER EXTREMITY ARTERIAL DUPLEX SCAN  TECHNIQUE: Gray-scale sonography as well as color Doppler and duplex ultrasound was performed to evaluate the arteries of the lower extremity.  COMPARISON:  None.  FINDINGS: Common femoral artery is widely patent. The femoral bifurcation is patent as well. The superficial femoral artery and profunda femoral arteries are within normal limits with normal waveforms.  Plaque formation is noted within the popliteal artery distally with decreased flow and elevated velocities in the distal aspect of the popliteal artery. The proximal infrapopliteal vessels are only partially visualized with monophasic waveforms identified.  IMPRESSION: Plaque formation in the popliteal artery decreased peripheral flow. The superficial femoral and common  femoral arteries are within normal limits.   Electronically Signed   By: Alcide Clever M.D.   On: 08/06/2015 15:05   Dg Foot Complete Right  08/06/2015   CLINICAL DATA:  Ulcers in the right foot for several months. No injury. History of diabetes. Right foot pain.  EXAM: RIGHT FOOT COMPLETE - 3+ VIEW  COMPARISON:  07/10/2015  FINDINGS: Examination is technically limited due to patient positioning. There has been previous resection of the fifth toe and metatarsal. Previous resection of the distal fourth metatarsal head. Since the previous study, there is interval development of soft tissue swelling diffusely throughout the  right midfoot with superior skin ulceration and multiple soft tissue gas collections consistent with infection with gas-forming organism. There is probable bone destruction involving the distal tarsal bones and possibly involving the proximal metatarsals. Findings suggest probable osteomyelitis. Vascular calcifications.  IMPRESSION: Diffuse soft tissue swelling with subcutaneous emphysema consistent with infection with gas-forming organism. Bone destruction and sclerosis involving the distal tarsal bones and probably the proximal metatarsal bones is suspicious for osteomyelitis and possibly joint space infection. Previous amputations.  These results were called by telephone at the time of interpretation on 08/06/2015 at 12:25 am to Dr. Effie Shy, who verbally acknowledged these results.   Electronically Signed   By: Burman Nieves M.D.   On: 08/06/2015 00:28    Scheduled Meds: . ALPRAZolam  0.5 mg Oral TID  . aztreonam  1 g Intravenous Q8H  . calcium acetate  667 mg Oral TID WC  . clindamycin (CLEOCIN) IV  600 mg Intravenous Q8H  . furosemide  80 mg Intravenous BID  . [START ON 08/08/2015] Influenza vac split quadrivalent PF  0.5 mL Intramuscular Tomorrow-1000  . insulin aspart  0-20 Units Subcutaneous TID WC  . insulin aspart  0-5 Units Subcutaneous QHS  . insulin glargine  42 Units Subcutaneous QHS  . iron polysaccharides  150 mg Oral BID  . metoprolol  50 mg Oral BID  . omega-3 acid ethyl esters  1,000 mg Oral BID  . pantoprazole  40 mg Oral Daily  . Vilazodone HCl  20 mg Oral Daily   Continuous Infusions: . sodium chloride 75 mL/hr at 08/07/15 0259    Principal Problem:   Osteomyelitis of right foot Active Problems:   Diabetic foot infection   Acute renal failure superimposed on stage 4 chronic kidney disease   Anemia, chronic disease   Diabetes mellitus with complication   Essential hypertension   Hyponatremia   Diabetic ulcer of right foot   Leukocytosis    Time spent: 25  minutes   By signing my name below, I, Zadie Cleverly, attest that this documentation has been prepared under the direction and in the presence of Erick Blinks, MD. Electronically signed: Zadie Cleverly, Scribe. 08/07/2015   Erick Blinks, M.D.  [facility] Pager [phone]. If 7PM-7AM, please contact night-coverage at [email or web address], password [facility] 08/07/2015, 11:38 AM  LOS: 1 day     I have reviewed the above documentation for accuracy and completeness, and I agree with the above.  MEMON,JEHANZEB

## 2015-08-08 LAB — CBC
HEMATOCRIT: 29.2 % — AB (ref 39.0–52.0)
HEMOGLOBIN: 10 g/dL — AB (ref 13.0–17.0)
MCH: 27.8 pg (ref 26.0–34.0)
MCHC: 34.2 g/dL (ref 30.0–36.0)
MCV: 81.1 fL (ref 78.0–100.0)
Platelets: 365 10*3/uL (ref 150–400)
RBC: 3.6 MIL/uL — ABNORMAL LOW (ref 4.22–5.81)
RDW: 15 % (ref 11.5–15.5)
WBC: 25.9 10*3/uL — ABNORMAL HIGH (ref 4.0–10.5)

## 2015-08-08 LAB — BASIC METABOLIC PANEL
Anion gap: 10 (ref 5–15)
BUN: 63 mg/dL — AB (ref 6–20)
CALCIUM: 8 mg/dL — AB (ref 8.9–10.3)
CO2: 20 mmol/L — AB (ref 22–32)
Chloride: 97 mmol/L — ABNORMAL LOW (ref 101–111)
Creatinine, Ser: 5.44 mg/dL — ABNORMAL HIGH (ref 0.61–1.24)
GFR calc Af Amer: 14 mL/min — ABNORMAL LOW (ref >60)
GFR, EST NON AFRICAN AMERICAN: 12 mL/min — AB (ref >60)
GLUCOSE: 192 mg/dL — AB (ref 65–99)
Potassium: 4.1 mmol/L (ref 3.5–5.1)
Sodium: 127 mmol/L — ABNORMAL LOW (ref 135–145)

## 2015-08-08 LAB — GLUCOSE, CAPILLARY
GLUCOSE-CAPILLARY: 92 mg/dL (ref 65–99)
GLUCOSE-CAPILLARY: 134 mg/dL — AB (ref 65–99)
Glucose-Capillary: 194 mg/dL — ABNORMAL HIGH (ref 65–99)
Glucose-Capillary: 279 mg/dL — ABNORMAL HIGH (ref 65–99)

## 2015-08-08 LAB — PHOSPHORUS: PHOSPHORUS: 5.5 mg/dL — AB (ref 2.5–4.6)

## 2015-08-08 LAB — OSMOLALITY, URINE: Osmolality, Ur: 306 mOsm/kg — ABNORMAL LOW (ref 390–1090)

## 2015-08-08 MED ORDER — FUROSEMIDE 10 MG/ML IJ SOLN
40.0000 mg | Freq: Every day | INTRAMUSCULAR | Status: DC
  Administered 2015-08-08 – 2015-08-11 (×3): 40 mg via INTRAVENOUS
  Filled 2015-08-08 (×4): qty 4

## 2015-08-08 MED ORDER — PROMETHAZINE HCL 25 MG/ML IJ SOLN
12.5000 mg | Freq: Four times a day (QID) | INTRAMUSCULAR | Status: DC | PRN
  Administered 2015-08-08 – 2015-08-11 (×5): 12.5 mg via INTRAVENOUS
  Filled 2015-08-08 (×5): qty 1

## 2015-08-08 MED ORDER — SODIUM CHLORIDE 0.9 % IV SOLN: Freq: Once | INTRAVENOUS | Status: DC

## 2015-08-08 NOTE — Progress Notes (Signed)
2nd unit of blood administered during this shift.  Pt tolerating well.

## 2015-08-08 NOTE — Progress Notes (Signed)
Dressing to right lower extremity changed as per order. Patient tolerated well.

## 2015-08-08 NOTE — Progress Notes (Signed)
Notified Dr. Kerry Hough that the patient first requested to be place on phenergan because the zofran did not help his nausea.  He says he becomes nauseated and vomits with the dilaudid.  He then asked if he could have fentanyl and compazine instead.  I text paged the request to MD.  New orders given and followed.

## 2015-08-08 NOTE — Progress Notes (Signed)
Subjective: Patient complains of polyuria and nocturia. Denies any difficulty in brathing  Objective: Vital signs in last 24 hours: Temp:  [97.3 F (36.3 C)-99.6 F (37.6 C)] 98.9 F (37.2 C) (09/10 0605) Pulse Rate:  [88-97] 97 (09/10 0605) Resp:  [16-18] 16 (09/10 0605) BP: (102-132)/(53-71) 127/54 mmHg (09/10 0605) SpO2:  [95 %-100 %] 97 % (09/10 0605)  Intake/Output from previous day: 09/09 0701 - 09/10 0700 In: 1380 [P.O.:720; Blood:660] Out: 3 [Stool:3] Intake/Output this shift:     Recent Labs  08/05/15 2345 08/06/15 0614 08/07/15 0611 08/08/15 0631  HGB 8.1* 6.8* 8.6* 10.0*    Recent Labs  08/07/15 0611 08/08/15 0631  WBC 22.6* 25.9*  RBC 3.08* 3.60*  HCT 25.2* 29.2*  PLT 381 365    Recent Labs  08/07/15 0611 08/08/15 0631  NA 127* 127*  K 4.3 4.1  CL 98* 97*  CO2 20* 20*  BUN 60* 63*  CREATININE 5.36* 5.44*  GLUCOSE 168* 192*  CALCIUM 8.3* 8.0*    Recent Labs  08/05/15 2345  INR 1.40    Generally patient is alert and in no apparent distress Chest is clear to auscultation Heart exam revealed regular rate and rhythm no murmur Extremities he has 1+ edema on the right and no edema on the left  Assessment/Plan: Problem #1 acute kidney injury superimposed on chronic. His BUN and creatinine is slightly higher today possibly from fluid removal. Patient is presently asymptoatmic Problem #2 chronic renal failure: Stage IV Problem #3 hyponatremia: His sodium is 127 low but stable Problem #4 anemia: His status post blood transfusion. His hemoglobin has improved. Problem #5 diabetes Problem #6 hypertension: His blood pressure is reasonably controlled Problem #7 metabolic bone disease: His calcium is range but his phosphorus is slightly high. He is started on binder Problem #8 osteomyelitis of his right foot: Presently he is on anti-biotics . Patient for amputation next week Plan: 1]We'll decrease lasix to 40 mg iv once a day 2]]We'll check his  basic metabolic panel in the morning.   Koji Niehoff S 08/08/2015, 11:12 AM

## 2015-08-08 NOTE — Progress Notes (Signed)
Hemoglobin is more stable. Patient is hesitant on having a below the knee amputation and would prefer an above-the-knee amputation. We had a long discussion about this. He will give me his final decision tomorrow. He does realize he needs an amputation. This has been scheduled for 08/10/2015.

## 2015-08-08 NOTE — Progress Notes (Signed)
[facility] PROGRESS NOTE  LOGON UTTECH ZOX:[id number] DOB: Mar 30, 1976 DOA: 08/05/2015 PCP: PROVIDER NOT IN SYSTEM  Assessment/Plan: 1. Osteomyelitis of right foot. MRI foot show extensive changes consistent with osteomyelitis. Continue on IV clindamycin and aztreonam. Per Dr. Lovell Sheehan, amputation scheduled for 08/10/15 pending improvement of other issues listed below. . 2. DM, type 2. CBG stable. Contnue SSI. 3. Acute renal failure, superimposed on stage 4 chronic kidney disease. Nephrology has evaluated the patient. He is currently on IVF and IV lasix. He reports good urine output although this has not been charted. Creatinine is stable today, will continue current treatments.  4. Leukocytosis. WBC elevated to 25.9 likely related to infections, continue abx.  5. Hyponatremia. Currently on  IVF and lasix. Urine electrolytes and Osm. Sodium is trending up  6. Anemia, chronic disease. Presented with Hgb at 6.8. He was transfused 4 units PRBCs since addmision with improvement of Hgb to 10.(Goal Hgb of 10/ hematocrit 30) Continue Iron supplement. Continue to monitor. 7. Essential Hypertension. Blood pressure currently stable, continue metoprolol.  8. GERD. Continue Protonix.   Code Status: Full DVT Prophylaxis: SCDs Family Communication: Discussed with patient who understands and has no concerns at this time. Disposition Plan: Discharge once improved   Consultants:  General surgery- Dr. Lovell Sheehan   Nephrology- Salomon Mast, MD  Podiatry-  [name], DPM  Procedures:  Transfused 4 unit prbc  Antibiotics:  Clindamycin 9/7>>  Aztreonam 9/9>>  HPI/Subjective Complaining of frequent urination. No chest pain or shortness of breath. No nausea or vomiting.  Objective: Filed Vitals:   08/08/15 0605  BP: 127/54  Pulse: 97  Temp: 98.9 F (37.2 C)  Resp: 16    Intake/Output Summary (Last 24 hours) at 08/08/15 1124 Last data filed at 08/08/15 0235  Gross per 24  hour  Intake   1140 ml  Output      3 ml  Net   1137 ml   Filed Weights   08/05/15 2254 08/06/15 0314  Weight: 112.492 kg (248 lb) 102.558 kg (226 lb 1.6 oz)    Exam: General:  Appears comfortable, calm. Cardiovascular: RRR, no murmur, rub or gallop. No lower extremity edema in LLE. Respiratory: CTA B. Normal respiratory effort. Abdomen: soft, ntnd Skin: no rash or induration  Musculoskeletal: right leg is edematous and erythematous. Right foot has dressing in place that was not removed. Foul smell noted coming from right foot.  Psychiatric: better spirits today   Data Reviewed: Basic Metabolic Panel:  Recent Labs Lab 08/05/15 2345 08/06/15 0614 08/07/15 0611 08/08/15 0631  NA 123* 125* 127* 127*  K 5.1 4.5 4.3 4.1  CL 92* 96* 98* 97*  CO2 21* 22 20* 20*  GLUCOSE 380* 293* 168* 192*  BUN 61* 59* 60* 63*  CREATININE 5.97* 5.74* 5.36* 5.44*  CALCIUM 8.9 8.5* 8.3* 8.0*  PHOS  --   --  6.0* 5.5*   Liver Function Tests:  CBC:  Recent Labs Lab 08/05/15 2345 08/06/15 0614 08/07/15 0611 08/08/15 0631  WBC 23.7* 18.3* 22.6* 25.9*  NEUTROABS 19.5*  --   --   --   HGB 8.1* 6.8* 8.6* 10.0*  HCT 24.1* 20.0* 25.2* 29.2*  MCV 82.3 81.6 81.8 81.1  PLT 369 360 381 365   Cardiac Enzymes:  BNP (last 3 results)  ProBNP (last 3 results)  CBG:  Recent Labs Lab 08/07/15 0816 08/07/15 1138 08/07/15 1714 08/07/15 2208 08/08/15 0723  GLUCAP 152* 204* 161* 183* 194*    Recent Results (from the  past 240 hour(s))  Blood culture (routine x 2)     Status: None (Preliminary result)   Collection Time: 08/06/15 12:50 AM  Result Value Ref Range Status   Specimen Description BLOOD RIGHT ANTECUBITAL  Final   Special Requests   Final    BOTTLES DRAWN AEROBIC AND ANAEROBIC AEB=8CC ANA=6CC   Culture NO GROWTH 2 DAYS  Final   Report Status PENDING  Incomplete  Blood culture (routine x 2)     Status: None (Preliminary result)   Collection Time: 08/06/15  1:03 AM  Result  Value Ref Range Status   Specimen Description BLOOD LEFT ANTECUBITAL  Final   Special Requests   Final    BOTTLES DRAWN AEROBIC AND ANAEROBIC AEB 6CC ANA 10CC   Culture PENDING  Incomplete   Report Status PENDING  Incomplete     Studies: [place] Lower Ext Art Right Ltd  08/06/2015   CLINICAL DATA:  Preoperative evaluation for below-knee amputation common known history of distal open wounds on the right  EXAM: UNILATERAL right LOWER EXTREMITY ARTERIAL DUPLEX SCAN  TECHNIQUE: Gray-scale sonography as well as color Doppler and duplex ultrasound was performed to evaluate the arteries of the lower extremity.  COMPARISON:  None.  FINDINGS: Common femoral artery is widely patent. The femoral bifurcation is patent as well. The superficial femoral artery and profunda femoral arteries are within normal limits with normal waveforms.  Plaque formation is noted within the popliteal artery distally with decreased flow and elevated velocities in the distal aspect of the popliteal artery. The proximal infrapopliteal vessels are only partially visualized with monophasic waveforms identified.  IMPRESSION: Plaque formation in the popliteal artery decreased peripheral flow. The superficial femoral and common femoral arteries are within normal limits.   Electronically Signed   By: Alcide Clever M.D.   On: 08/06/2015 15:05    Scheduled Meds: . sodium chloride   Intravenous Once  . ALPRAZolam  0.5 mg Oral TID  . aztreonam  1 g Intravenous Q8H  . calcium acetate  667 mg Oral TID WC  . clindamycin (CLEOCIN) IV  600 mg Intravenous Q8H  . [START ON 08/09/2015] furosemide  40 mg Intravenous Daily  . Influenza vac split quadrivalent PF  0.5 mL Intramuscular Tomorrow-1000  . insulin aspart  0-20 Units Subcutaneous TID WC  . insulin aspart  0-5 Units Subcutaneous QHS  . insulin glargine  42 Units Subcutaneous QHS  . iron polysaccharides  150 mg Oral BID  . metoprolol  50 mg Oral BID  . omega-3 acid ethyl esters  1,000 mg Oral  BID  . pantoprazole  40 mg Oral Daily  . Vilazodone HCl  20 mg Oral Daily   Continuous Infusions: . sodium chloride 75 mL/hr at 08/08/15 1610    Principal Problem:   Osteomyelitis of right foot Active Problems:   Diabetic foot infection   Acute renal failure superimposed on stage 4 chronic kidney disease   Anemia, chronic disease   Diabetes mellitus with complication   Essential hypertension   Hyponatremia   Diabetic ulcer of right foot   Leukocytosis    Time spent: 30 minutes  Erick Blinks, M.D.  [facility] Pager [phone]. If 7PM-7AM, please contact night-coverage at [email or web address], password [facility] 08/08/2015, 11:24 AM  LOS: 2 days

## 2015-08-08 NOTE — Progress Notes (Signed)
Discussed signing consent for amputation with patient. Patient declined at this time. Stated Dr. Lovell Sheehan would be talking to him and his wife tomorrow and he wanted to wait for physician to talk to his wife.

## 2015-08-09 DIAGNOSIS — M869 Osteomyelitis, unspecified: Secondary | ICD-10-CM | POA: Diagnosis not present

## 2015-08-09 LAB — BASIC METABOLIC PANEL
Anion gap: 10 (ref 5–15)
BUN: 60 mg/dL — AB (ref 6–20)
CO2: 21 mmol/L — ABNORMAL LOW (ref 22–32)
CREATININE: 5.48 mg/dL — AB (ref 0.61–1.24)
Calcium: 7.9 mg/dL — ABNORMAL LOW (ref 8.9–10.3)
Chloride: 98 mmol/L — ABNORMAL LOW (ref 101–111)
GFR calc Af Amer: 14 mL/min — ABNORMAL LOW (ref >60)
GFR, EST NON AFRICAN AMERICAN: 12 mL/min — AB (ref >60)
GLUCOSE: 108 mg/dL — AB (ref 65–99)
POTASSIUM: 3.7 mmol/L (ref 3.5–5.1)
Sodium: 129 mmol/L — ABNORMAL LOW (ref 135–145)

## 2015-08-09 LAB — GLUCOSE, CAPILLARY
GLUCOSE-CAPILLARY: 238 mg/dL — AB (ref 65–99)
Glucose-Capillary: 105 mg/dL — ABNORMAL HIGH (ref 65–99)
Glucose-Capillary: 125 mg/dL — ABNORMAL HIGH (ref 65–99)
Glucose-Capillary: 226 mg/dL — ABNORMAL HIGH (ref 65–99)

## 2015-08-09 LAB — PREPARE RBC (CROSSMATCH)

## 2015-08-09 LAB — SURGICAL PCR SCREEN
MRSA, PCR: NEGATIVE
Staphylococcus aureus: NEGATIVE

## 2015-08-09 MED ORDER — OXYCODONE HCL 5 MG PO TABS
30.0000 mg | ORAL_TABLET | Freq: Four times a day (QID) | ORAL | Status: DC | PRN
  Administered 2015-08-09 – 2015-08-14 (×15): 30 mg via ORAL
  Filled 2015-08-09 (×15): qty 6

## 2015-08-09 MED ORDER — CHLORHEXIDINE GLUCONATE 4 % EX LIQD
1.0000 "application " | Freq: Once | CUTANEOUS | Status: AC
  Administered 2015-08-09: 1 via TOPICAL
  Filled 2015-08-09: qty 15

## 2015-08-09 NOTE — Progress Notes (Signed)
Subjective: Patient offers no complaint except worrying about his surgery  Objective: Vital signs in last 24 hours: Temp:  [98 F (36.7 C)-98.5 F (36.9 C)] 98.2 F (36.8 C) (09/11 0600) Pulse Rate:  [86-96] 86 (09/11 0600) Resp:  [16] 16 (09/11 0600) BP: (119-129)/(51-64) 129/64 mmHg (09/11 0600) SpO2:  [95 %-97 %] 96 % (09/11 0600)  Intake/Output from previous day: 09/10 0701 - 09/11 0700 In: 4776.3 [P.O.:720; I.V.:3506.3; IV Piggyback:550] Out: 1500 [Urine:1500] Intake/Output this shift: Total I/O In: 240 [P.O.:240] Out: 825 [Urine:825]   Recent Labs  08/07/15 0611 08/08/15 0631  HGB 8.6* 10.0*    Recent Labs  08/07/15 0611 08/08/15 0631  WBC 22.6* 25.9*  RBC 3.08* 3.60*  HCT 25.2* 29.2*  PLT 381 365    Recent Labs  08/08/15 0631 08/09/15 0610  NA 127* 129*  K 4.1 3.7  CL 97* 98*  CO2 20* 21*  BUN 63* 60*  CREATININE 5.44* 5.48*  GLUCOSE 192* 108*  CALCIUM 8.0* 7.9*   No results for input([name]): LABPT, INR in the last 72 hours.  Generally patient is alert and in no apparent distress Chest is clear to auscultation Heart exam revealed regular rate and rhythm no murmur Extremities he has 1+ edema on the right and no edema on the left  Assessment/Plan: Problem #1 acute kidney injury superimposed on chronic. His renal function is stable and asymptomatic. Patient is none oliguric Problem #2 chronic renal failure: Stage IV Problem #3 hyponatremia: His sodium is progressively improving Problem #4 anemia: His status post blood transfusion. His hemoglobin has improved. Problem #5 diabetes Problem #6 hypertension: His blood pressure is reasonably controlled Problem #7 metabolic bone disease: His calcium is range but his phosphorus is slightly high. He is started on binder Problem #8 osteomyelitis of his right foot: Presently he is on anti-biotics . Patient for amputation next week Plan: 1]We'll continue with present treatement 2]]We'll check his basic  metabolic panel ,phosphorus  and CBCin the morning.   Maurice [name] 08/09/2015, 10:00 AM

## 2015-08-09 NOTE — Progress Notes (Signed)
[facility] PROGRESS NOTE  Maurice [name] ZOX:[id number] DOB: 1976/08/24 DOA: 08/05/2015 PCP: PROVIDER NOT IN SYSTEM  Assessment/Plan: 1. Osteomyelitis of right foot. MRI foot show extensive changes consistent with osteomyelitis. Continue on IV clindamycin and aztreonam. Per Dr. Lovell Sheehan, amputation scheduled for 08/10/15 pending improvement of other issues listed below.  2. DM, type 2. CBG stable. Contnue SSI. 3. Acute renal failure, superimposed on stage 4 chronic kidney disease. Nephrology is following the patient. He is currently on IVF and IV lasix. Creatinine is stable today, will continue current treatments.  4. Leukocytosis. WBC elevated to 25.9 likely related to infections, continue abx.  5. Hyponatremia. Currently on  IVF and lasix. Urine electrolytes and Osm. Sodium is trending up  6. Anemia, chronic disease. Presented with Hgb at 6.8. He was transfused 4 units PRBCs since addmision with improvement of Hgb to 10. Will recheck hemoglobin in am and transfuse as necessary. Continue Iron supplement. Continue to monitor. 7. Essential Hypertension. Blood pressure currently stable, continue metoprolol.  8. GERD. Continue Protonix.   Code Status: Full DVT Prophylaxis: SCDs Family Communication: Discussed with patient who understands and has no concerns at this time. Disposition Plan: Discharge once improved   Consultants:  General surgery- Dr. Lovell Sheehan   Nephrology- Salomon Mast, MD  Podiatry-  [name], DPM  Procedures:  Transfused 4 unit prbc  Antibiotics:  Clindamycin 9/7>>  Aztreonam 9/9>>  HPI/Subjective No new complaints, no shortness of breath, nausea or vomiting.  Objective: Filed Vitals:   08/09/15 1500  BP: 116/62  Pulse: 81  Temp: 98.9 F (37.2 C)  Resp: 18    Intake/Output Summary (Last 24 hours) at 08/09/15 1643 Last data filed at 08/09/15 1200  Gross per 24 hour  Intake 4726.25 ml  Output   2125 ml  Net 2601.25 ml   Filed  Weights   08/05/15 2254 08/06/15 0314  Weight: 112.492 kg (248 lb) 102.558 kg (226 lb 1.6 oz)    Exam: General:  No distress Cardiovascular: s1, s2, regular rate, no murmur, rub or gallop. No lower extremity edema in LLE. Respiratory: clear bilaterally. Normal respiratory effort. Abdomen: soft, ntnd Skin: no rash or induration  Musculoskeletal: right leg is edematous and erythematous. Right foot has dressing in place that was not removed. Foul smell noted coming from right foot.     Data Reviewed: Basic Metabolic Panel:  Recent Labs Lab 08/05/15 2345 08/06/15 0614 08/07/15 0611 08/08/15 0631 08/09/15 0610  NA 123* 125* 127* 127* 129*  K 5.1 4.5 4.3 4.1 3.7  CL 92* 96* 98* 97* 98*  CO2 21* 22 20* 20* 21*  GLUCOSE 380* 293* 168* 192* 108*  BUN 61* 59* 60* 63* 60*  CREATININE 5.97* 5.74* 5.36* 5.44* 5.48*  CALCIUM 8.9 8.5* 8.3* 8.0* 7.9*  PHOS  --   --  6.0* 5.5*  --    Liver Function Tests:  CBC:  Recent Labs Lab 08/05/15 2345 08/06/15 0614 08/07/15 0611 08/08/15 0631  WBC 23.7* 18.3* 22.6* 25.9*  NEUTROABS 19.5*  --   --   --   HGB 8.1* 6.8* 8.6* 10.0*  HCT 24.1* 20.0* 25.2* 29.2*  MCV 82.3 81.6 81.8 81.1  PLT 369 360 381 365   Cardiac Enzymes:  BNP (last 3 results)  ProBNP (last 3 results)  CBG:  Recent Labs Lab 08/08/15 1620 08/08/15 2152 08/09/15 0720 08/09/15 1125 08/09/15 1623  GLUCAP 92 134* 105* 238* 125*    Recent Results (from the past 240 hour(s))  Blood culture (routine  x 2)     Status: None (Preliminary result)   Collection Time: 08/06/15 12:50 AM  Result Value Ref Range Status   Specimen Description BLOOD RIGHT ANTECUBITAL  Final   Special Requests   Final    BOTTLES DRAWN AEROBIC AND ANAEROBIC AEB=8CC ANA=6CC   Culture NO GROWTH 3 DAYS  Final   Report Status PENDING  Incomplete  Blood culture (routine x 2)     Status: None (Preliminary result)   Collection Time: 08/06/15  1:03 AM  Result Value Ref Range Status   Specimen  Description BLOOD LEFT ANTECUBITAL  Final   Special Requests   Final    BOTTLES DRAWN AEROBIC AND ANAEROBIC AEB 6CC ANA 10CC   Culture PENDING  Incomplete   Report Status PENDING  Incomplete  Surgical pcr screen     Status: None   Collection Time: 08/09/15 11:20 AM  Result Value Ref Range Status   MRSA, PCR NEGATIVE NEGATIVE Final   Staphylococcus aureus NEGATIVE NEGATIVE Final    Comment:        The Xpert SA Assay (FDA approved for NASAL specimens in patients over 3 years of age), is one component of a comprehensive surveillance program.  Test performance has been validated by [facility] for patients greater than or equal to 94 year old. It is not intended to diagnose infection nor to guide or monitor treatment.      Studies: No results found.  Scheduled Meds: . sodium chloride   Intravenous Once  . ALPRAZolam  0.5 mg Oral TID  . aztreonam  1 g Intravenous Q8H  . calcium acetate  667 mg Oral TID WC  . chlorhexidine  1 application Topical Once  . clindamycin (CLEOCIN) IV  600 mg Intravenous Q8H  . furosemide  40 mg Intravenous Daily  . insulin aspart  0-20 Units Subcutaneous TID WC  . insulin aspart  0-5 Units Subcutaneous QHS  . insulin glargine  42 Units Subcutaneous QHS  . iron polysaccharides  150 mg Oral BID  . metoprolol  50 mg Oral BID  . omega-3 acid ethyl esters  1,000 mg Oral BID  . pantoprazole  40 mg Oral Daily  . Vilazodone HCl  20 mg Oral Daily   Continuous Infusions: . sodium chloride 75 mL/hr at 08/09/15 0103    Principal Problem:   Osteomyelitis of right foot Active Problems:   Diabetic foot infection   Acute renal failure superimposed on stage 4 chronic kidney disease   Anemia, chronic disease   Diabetes mellitus with complication   Essential hypertension   Hyponatremia   Diabetic ulcer of right foot   Leukocytosis    Time spent: 30 minutes  Erick Blinks, M.D.  [facility] Pager [phone]. If 7PM-7AM, please contact  night-coverage at [email or web address], password [facility] 08/09/2015, 4:43 PM  LOS: 3 days

## 2015-08-09 NOTE — Progress Notes (Signed)
After extensive discussion with patient and wife, he has elected to proceed with a right above-the-knee amputation. The risks and benefits of the procedure including bleeding, infection, wound breakdown, and the possibility of needing further blood transfusions were fully explained to the patient, who gave informed consent. Orders have been placed for surgery tomorrow.

## 2015-08-09 NOTE — Progress Notes (Signed)
ANTIBIOTIC CONSULT NOTE - FOLLOW UP  Pharmacy Consult for Aztreonam Indication: Osteomyelitis  Allergies  Allergen Reactions  . Daptomycin     Hypotension   . Bactrim [Sulfamethoxazole-Trimethoprim] Other (See Comments)    Cannot take due to kidney issue  . Dapsone Nausea And Vomiting  . Latex Other (See Comments)    Blisters.   . Penicillins Hives  . Vancomycin Itching    Patient Measurements: Height:  (190.5 cm) Weight: 226 lb 1.6 oz (102.558 kg) IBW/kg (Calculated) : 84.5   Vital Signs: Temp: 98.2 F (36.8 C) (09/11 0600) Temp Source: Oral (09/11 0600) BP: 129/64 mmHg (09/11 0600) Pulse Rate: 86 (09/11 0600) Intake/Output from previous day: 09/10 0701 - 09/11 0700 In: 4776.3 [P.O.:720; I.V.:3506.3; IV Piggyback:550] Out: 1500 [Urine:1500] Intake/Output from this shift: Total I/O In: 240 [P.O.:240] Out: 825 [Urine:825]  Labs:  Recent Labs  08/07/15 0611 08/08/15 0631 08/09/15 0610  WBC 22.6* 25.9*  --   HGB 8.6* 10.0*  --   PLT 381 365  --   CREATININE 5.36* 5.44* 5.48*   Estimated Creatinine Clearance: 23.2 mL/min (by C-G formula based on Cr of 5.48). No results for input(s): VANCOTROUGH, VANCOPEAK, VANCORANDOM, GENTTROUGH, GENTPEAK, GENTRANDOM, TOBRATROUGH, TOBRAPEAK, TOBRARND, AMIKACINPEAK, AMIKACINTROU, AMIKACIN in the last 72 hours.   Microbiology: Recent Results (from the past 720 hour(s))  Blood culture (routine x 2)     Status: None (Preliminary result)   Collection Time: 08/06/15 12:50 AM  Result Value Ref Range Status   Specimen Description BLOOD RIGHT ANTECUBITAL  Final   Special Requests   Final    BOTTLES DRAWN AEROBIC AND ANAEROBIC AEB=8CC ANA=6CC   Culture NO GROWTH 2 DAYS  Final   Report Status PENDING  Incomplete  Blood culture (routine x 2)     Status: None (Preliminary result)   Collection Time: 08/06/15  1:03 AM  Result Value Ref Range Status   Specimen Description BLOOD LEFT ANTECUBITAL  Final   Special Requests   Final     BOTTLES DRAWN AEROBIC AND ANAEROBIC AEB 6CC ANA 10CC   Culture PENDING  Incomplete   Report Status PENDING  Incomplete    Anti-infectives    Start     Dose/Rate Route Frequency Ordered Stop   08/07/15 0900  aztreonam (AZACTAM) 1 g in dextrose 5 % 50 mL IVPB     1 g 100 mL/hr over 30 Minutes Intravenous Every 8 hours 08/07/15 0740     08/06/15 1000  clindamycin (CLEOCIN) IVPB 600 mg     600 mg 100 mL/hr over 30 Minutes Intravenous Every 8 hours 08/06/15 0922     08/06/15 0045  clindamycin (CLEOCIN) IVPB 600 mg     600 mg 100 mL/hr over 30 Minutes Intravenous  Once 08/06/15 0036 08/06/15 0202     Assessment: Okay for Protocol, adjusted for poor renal function.  BKA planned on 9/12.  Goal of Therapy:  Eradicate infection.   Plan:  Continue Aztreonam 1gm IV every 8 hours. Follow up culture results  Mady Gemma 08/09/2015,11:20 AM

## 2015-08-10 ENCOUNTER — Encounter (HOSPITAL_COMMUNITY): Payer: Self-pay | Admitting: *Deleted

## 2015-08-10 ENCOUNTER — Inpatient Hospital Stay (HOSPITAL_COMMUNITY): Payer: Medicare Other | Admitting: Anesthesiology

## 2015-08-10 ENCOUNTER — Encounter (HOSPITAL_COMMUNITY)
Admission: EM | Disposition: A | Discharge status: Rehabilitation Facility | Payer: Self-pay | Source: Home / Self Care | Attending: Internal Medicine

## 2015-08-10 HISTORY — PX: AMPUTATION: SHX166

## 2015-08-10 LAB — TYPE AND SCREEN
ABO/RH(D): O NEG
Antibody Screen: NEGATIVE
UNIT DIVISION: 0
UNIT DIVISION: 0
UNIT DIVISION: 0
UNIT DIVISION: 0
Unit division: 0
Unit division: 0

## 2015-08-10 LAB — BASIC METABOLIC PANEL
ANION GAP: 9 (ref 5–15)
BUN: 59 mg/dL — ABNORMAL HIGH (ref 6–20)
CALCIUM: 7.5 mg/dL — AB (ref 8.9–10.3)
CO2: 21 mmol/L — ABNORMAL LOW (ref 22–32)
Chloride: 98 mmol/L — ABNORMAL LOW (ref 101–111)
Creatinine, Ser: 4.83 mg/dL — ABNORMAL HIGH (ref 0.61–1.24)
GFR, EST AFRICAN AMERICAN: 16 mL/min — AB (ref >60)
GFR, EST NON AFRICAN AMERICAN: 14 mL/min — AB (ref >60)
Glucose, Bld: 197 mg/dL — ABNORMAL HIGH (ref 65–99)
POTASSIUM: 3.6 mmol/L (ref 3.5–5.1)
Sodium: 128 mmol/L — ABNORMAL LOW (ref 135–145)

## 2015-08-10 LAB — HEMOGLOBIN AND HEMATOCRIT, BLOOD
HCT: 28.7 % — ABNORMAL LOW (ref 39.0–52.0)
HCT: 27.9 % — ABNORMAL LOW (ref 39.0–52.0)
HEMOGLOBIN: 9.8 g/dL — AB (ref 13.0–17.0)
Hemoglobin: 9.5 g/dL — ABNORMAL LOW (ref 13.0–17.0)

## 2015-08-10 LAB — GLUCOSE, CAPILLARY
GLUCOSE-CAPILLARY: 190 mg/dL — AB (ref 65–99)
GLUCOSE-CAPILLARY: 128 mg/dL — AB (ref 65–99)
GLUCOSE-CAPILLARY: 134 mg/dL — AB (ref 65–99)
Glucose-Capillary: 113 mg/dL — ABNORMAL HIGH (ref 65–99)
Glucose-Capillary: 164 mg/dL — ABNORMAL HIGH (ref 65–99)

## 2015-08-10 LAB — CBC
HEMATOCRIT: 28 % — AB (ref 39.0–52.0)
HEMOGLOBIN: 9.7 g/dL — AB (ref 13.0–17.0)
MCH: 28.3 pg (ref 26.0–34.0)
MCHC: 34.6 g/dL (ref 30.0–36.0)
MCV: 81.6 fL (ref 78.0–100.0)
Platelets: 391 10*3/uL (ref 150–400)
RBC: 3.43 MIL/uL — AB (ref 4.22–5.81)
RDW: 15.3 % (ref 11.5–15.5)
WBC: 24.4 10*3/uL — ABNORMAL HIGH (ref 4.0–10.5)

## 2015-08-10 LAB — PREPARE RBC (CROSSMATCH)

## 2015-08-10 LAB — PHOSPHORUS: PHOSPHORUS: 5.5 mg/dL — AB (ref 2.5–4.6)

## 2015-08-10 SURGERY — AMPUTATION, ABOVE KNEE
Anesthesia: General | Laterality: Right

## 2015-08-10 MED ORDER — FENTANYL CITRATE (PF) 100 MCG/2ML IJ SOLN
25.0000 ug | INTRAMUSCULAR | Status: DC | PRN
  Administered 2015-08-10: 50 ug via INTRAVENOUS
  Filled 2015-08-10: qty 2

## 2015-08-10 MED ORDER — PROMETHAZINE HCL 25 MG/ML IJ SOLN
12.5000 mg | Freq: Once | INTRAMUSCULAR | Status: AC
  Administered 2015-08-10: 12.5 mg via INTRAVENOUS
  Filled 2015-08-10: qty 1

## 2015-08-10 MED ORDER — FENTANYL CITRATE (PF) 100 MCG/2ML IJ SOLN
INTRAMUSCULAR | Status: DC | PRN
  Administered 2015-08-10: 50 ug via INTRAVENOUS
  Administered 2015-08-10: 25 ug via INTRAVENOUS
  Administered 2015-08-10 (×2): 50 ug via INTRAVENOUS
  Administered 2015-08-10: 100 ug via INTRAVENOUS
  Administered 2015-08-10 (×4): 50 ug via INTRAVENOUS
  Administered 2015-08-10: 25 ug via INTRAVENOUS
  Administered 2015-08-10 (×2): 50 ug via INTRAVENOUS

## 2015-08-10 MED ORDER — GLYCOPYRROLATE 0.2 MG/ML IJ SOLN
INTRAMUSCULAR | Status: DC | PRN
  Administered 2015-08-10: 0.6 mg via INTRAVENOUS

## 2015-08-10 MED ORDER — PROMETHAZINE HCL 25 MG/ML IJ SOLN
INTRAMUSCULAR | Status: AC
  Filled 2015-08-10: qty 1

## 2015-08-10 MED ORDER — LIDOCAINE HCL (PF) 1 % IJ SOLN
INTRAMUSCULAR | Status: AC
Start: 2015-08-10 — End: 2015-08-10
  Filled 2015-08-10: qty 5

## 2015-08-10 MED ORDER — SODIUM CHLORIDE 0.9 % IV SOLN
INTRAVENOUS | Status: DC | PRN
  Administered 2015-08-10 (×2): via INTRAVENOUS

## 2015-08-10 MED ORDER — FENTANYL CITRATE (PF) 250 MCG/5ML IJ SOLN
INTRAMUSCULAR | Status: AC
  Filled 2015-08-10: qty 25

## 2015-08-10 MED ORDER — BUPIVACAINE HCL (PF) 0.5 % IJ SOLN
INTRAMUSCULAR | Status: AC
  Filled 2015-08-10: qty 30

## 2015-08-10 MED ORDER — NEOSTIGMINE METHYLSULFATE 10 MG/10ML IV SOLN
INTRAVENOUS | Status: DC | PRN
  Administered 2015-08-10: 4 mg via INTRAVENOUS

## 2015-08-10 MED ORDER — SODIUM CHLORIDE 0.9 % IV SOLN: Freq: Once | INTRAVENOUS | Status: DC

## 2015-08-10 MED ORDER — ONDANSETRON HCL 4 MG/2ML IJ SOLN: 4.0000 mg | Freq: Once | INTRAMUSCULAR | Status: DC | PRN

## 2015-08-10 MED ORDER — MIDAZOLAM HCL 2 MG/2ML IJ SOLN
INTRAMUSCULAR | Status: AC
  Filled 2015-08-10: qty 2

## 2015-08-10 MED ORDER — POVIDONE-IODINE 10 % OINT PACKET
TOPICAL_OINTMENT | CUTANEOUS | Status: DC | PRN
  Administered 2015-08-10: 2 via TOPICAL

## 2015-08-10 MED ORDER — LIDOCAINE HCL (CARDIAC) 20 MG/ML IV SOLN
INTRAVENOUS | Status: DC | PRN
  Administered 2015-08-10: 50 mg via INTRAVENOUS

## 2015-08-10 MED ORDER — POVIDONE-IODINE 10 % EX OINT
TOPICAL_OINTMENT | CUTANEOUS | Status: AC
  Filled 2015-08-10: qty 1

## 2015-08-10 MED ORDER — PROMETHAZINE HCL 25 MG/ML IJ SOLN
12.5000 mg | Freq: Once | INTRAMUSCULAR | Status: AC
  Administered 2015-08-10: 12.5 mg via INTRAVENOUS

## 2015-08-10 MED ORDER — PROPOFOL 10 MG/ML IV BOLUS
INTRAVENOUS | Status: AC
  Filled 2015-08-10: qty 20

## 2015-08-10 MED ORDER — HYDROMORPHONE HCL 1 MG/ML IJ SOLN
0.5000 mg | INTRAMUSCULAR | Status: DC | PRN
  Administered 2015-08-10: 0.5 mg via INTRAVENOUS

## 2015-08-10 MED ORDER — MIDAZOLAM HCL 2 MG/2ML IJ SOLN
1.0000 mg | INTRAMUSCULAR | Status: DC | PRN
  Administered 2015-08-10: 2 mg via INTRAVENOUS

## 2015-08-10 MED ORDER — FENTANYL CITRATE (PF) 250 MCG/5ML IJ SOLN
INTRAMUSCULAR | Status: AC
Start: 2015-08-10 — End: 2015-08-10
  Filled 2015-08-10: qty 25

## 2015-08-10 MED ORDER — LACTATED RINGERS IV SOLN
INTRAVENOUS | Status: DC
  Administered 2015-08-10: 10:00:00 via INTRAVENOUS

## 2015-08-10 MED ORDER — SODIUM CHLORIDE 0.9 % IR SOLN
Status: DC | PRN
  Administered 2015-08-10: 1000 mL

## 2015-08-10 MED ORDER — ROCURONIUM BROMIDE 100 MG/10ML IV SOLN
INTRAVENOUS | Status: DC | PRN
  Administered 2015-08-10: 30 mg via INTRAVENOUS
  Administered 2015-08-10: 10 mg via INTRAVENOUS

## 2015-08-10 MED ORDER — LORAZEPAM 2 MG/ML IJ SOLN
1.0000 mg | Freq: Four times a day (QID) | INTRAMUSCULAR | Status: DC | PRN
Start: 2015-08-10 — End: 2015-08-12
  Administered 2015-08-11 – 2015-08-12 (×3): 1 mg via INTRAVENOUS
  Filled 2015-08-10 (×4): qty 1

## 2015-08-10 MED ORDER — PROCHLORPERAZINE EDISYLATE 5 MG/ML IJ SOLN
10.0000 mg | Freq: Three times a day (TID) | INTRAMUSCULAR | Status: DC | PRN
  Administered 2015-08-10 – 2015-08-12 (×3): 10 mg via INTRAVENOUS
  Filled 2015-08-10 (×3): qty 2

## 2015-08-10 MED ORDER — CYCLOBENZAPRINE HCL 10 MG PO TABS
10.0000 mg | ORAL_TABLET | Freq: Three times a day (TID) | ORAL | Status: DC | PRN
  Administered 2015-08-10 – 2015-08-14 (×8): 10 mg via ORAL
  Filled 2015-08-10 (×8): qty 1

## 2015-08-10 MED ORDER — PROPOFOL 10 MG/ML IV BOLUS
INTRAVENOUS | Status: DC | PRN
  Administered 2015-08-10: 150 mg via INTRAVENOUS
  Administered 2015-08-10: 50 mg via INTRAVENOUS

## 2015-08-10 MED ORDER — FENTANYL CITRATE (PF) 100 MCG/2ML IJ SOLN
INTRAMUSCULAR | Status: AC
  Filled 2015-08-10: qty 4

## 2015-08-10 MED ORDER — HYDROMORPHONE HCL 1 MG/ML IJ SOLN
1.0000 mg | INTRAMUSCULAR | Status: DC | PRN
  Administered 2015-08-10 – 2015-08-11 (×5): 1 mg via INTRAVENOUS
  Filled 2015-08-10 (×6): qty 1

## 2015-08-10 SURGICAL SUPPLY — 36 items
BAG HAMPER (MISCELLANEOUS) ×3 IMPLANT
BANDAGE ELASTIC 6 VELCRO NS (GAUZE/BANDAGES/DRESSINGS) ×6 IMPLANT
BLADE SAW RECIP 87.9 MT (BLADE) ×3 IMPLANT
BLADE SURG SZ10 CARB STEEL (BLADE) ×3 IMPLANT
BLADE SURG SZ20 CARB STEEL (BLADE) ×3 IMPLANT
BNDG COHESIVE 4X5 TAN STRL (GAUZE/BANDAGES/DRESSINGS) ×3 IMPLANT
CLOTH BEACON ORANGE TIMEOUT ST (SAFETY) ×3 IMPLANT
COVER LIGHT HANDLE STERIS (MISCELLANEOUS) ×6 IMPLANT
ELECT REM PT RETURN 9FT ADLT (ELECTROSURGICAL) ×3
ELECTRODE REM PT RTRN 9FT ADLT (ELECTROSURGICAL) ×1 IMPLANT
FLOOR PAD 36X40 (MISCELLANEOUS) ×3
GAUZE SPONGE 4X4 12PLY STRL (GAUZE/BANDAGES/DRESSINGS) ×3 IMPLANT
GLOVE BIOGEL PI IND STRL 7.0 (GLOVE) ×3 IMPLANT
GLOVE BIOGEL PI INDICATOR 7.0 (GLOVE) ×6
GLOVE ECLIPSE 6.5 STRL STRAW (GLOVE) ×3 IMPLANT
GLOVE SURG SS PI 7.0 STRL IVOR (GLOVE) ×3 IMPLANT
GLOVE SURG SS PI 7.5 STRL IVOR (GLOVE) ×3 IMPLANT
GOWN STRL REUS W/TWL LRG LVL3 (GOWN DISPOSABLE) ×12 IMPLANT
INST SET MAJOR BONE (KITS) ×3 IMPLANT
KIT ROOM TURNOVER APOR (KITS) ×3 IMPLANT
MANIFOLD NEPTUNE II (INSTRUMENTS) ×3 IMPLANT
NS IRRIG 1000ML POUR BTL (IV SOLUTION) ×3 IMPLANT
PACK BASIC LIMB (CUSTOM PROCEDURE TRAY) ×3 IMPLANT
PAD ABD 5X9 TENDERSORB (GAUZE/BANDAGES/DRESSINGS) ×6 IMPLANT
PAD ARMBOARD 7.5X6 YLW CONV (MISCELLANEOUS) ×3 IMPLANT
PAD FLOOR 36X40 (MISCELLANEOUS) ×1 IMPLANT
SET BASIN LINEN APH (SET/KITS/TRAYS/PACK) ×3 IMPLANT
SPONGE LAP 18X18 X RAY DECT (DISPOSABLE) ×12 IMPLANT
STAPLER VISISTAT 35W (STAPLE) ×6 IMPLANT
SUT BONE WAX W31G (SUTURE) ×3 IMPLANT
SUT SILK 0 FSL (SUTURE) ×24 IMPLANT
SUT SILK 1 TIES 10/18 (SUTURE) ×3 IMPLANT
SUT VIC AB 2-0 CT1 27 (SUTURE) ×8
SUT VIC AB 2-0 CT1 TAPERPNT 27 (SUTURE) ×4 IMPLANT
SUT VIC AB 2-0 CT2 27 (SUTURE) ×12 IMPLANT
YANKAUER SUCT BULB TIP NO VENT (SUCTIONS) ×3 IMPLANT

## 2015-08-10 NOTE — Anesthesia Postprocedure Evaluation (Signed)
  Anesthesia Post-op Note  Patient: BOOKERT GUZZI  Procedure(s) Performed: Procedure(s): RIGHT LEG ABOVE KNEE AMPUTATION (Right)  Patient Location: PACU  Anesthesia Type:General  Level of Consciousness: awake, alert , oriented and patient cooperative  Airway and Oxygen Therapy: Patient Spontanous Breathing and Patient connected to face mask oxygen  Post-op Pain: moderate  Post-op Assessment: Post-op Vital signs reviewed, Patient's Cardiovascular Status Stable, Respiratory Function Stable, Patent Airway, No signs of Nausea or vomiting and Pain level controlled              Post-op Vital Signs: Reviewed and stable  Last Vitals:  Filed Vitals:   08/10/15 1335  BP: 131/75  Pulse:   Temp:   Resp: 26    Complications: No apparent anesthesia complications

## 2015-08-10 NOTE — Transfer of Care (Signed)
Immediate Anesthesia Transfer of Care Note  Patient: Maurice [name]  Procedure(s) Performed: Procedure(s): RIGHT LEG ABOVE KNEE AMPUTATION (Right)  Patient Location: PACU  Anesthesia Type:General  Level of Consciousness: awake, oriented and patient cooperative  Airway & Oxygen Therapy: Patient Spontanous Breathing and Patient connected to face mask oxygen  Post-op Assessment: Report given to RN and Post -op Vital signs reviewed and stable  Post vital signs: Reviewed and stable  Last Vitals:  Filed Vitals:   08/10/15 1335  BP: 131/75  Pulse:   Temp:   Resp: 26    Complications: No apparent anesthesia complications

## 2015-08-10 NOTE — Progress Notes (Signed)
[facility] PROGRESS NOTE  Maurice [name] ZOX:[id number] DOB: [birth year] DOA: 08/05/2015 PCP: PROVIDER NOT IN SYSTEM  Assessment/Plan: 1. Osteomyelitis of right foot. MRI foot show extensive changes consistent with osteomyelitis. Will continue on IV clindamycin and aztreonam. Plans are for above the knee amputation for today. 2. DM, type 2. CBG stable. Contnue SSI. 3. Acute renal failure, superimposed on stage 4 chronic kidney disease. Nephrology is following the patient. He is currently on IVF and IV lasix. Creatinine is improving today, will continue current treatments.  4. Leukocytosis. WBC trending down at 25.9 today, likely related to infections. Will continue abx.  5. Hyponatremia, likely related hypovolemia . Currently on IVF and lasix. Slowly improving. Continue to monitor. 6. Anemia, chronic disease. Presented with Hgb at 6.8. He was transfused 4 units PRBCs since addmision with improvement of Hgb to 10. Hgb currently at 9.7. Pt will likely receive another unit of PRBCs prior to surgery. Will continue Iron supplement. Will continue to monitor. 7. Essential Hypertension. Blood pressure currently stable, continue metoprolol.  8. GERD. Will continue Protonix.   Code Status: Full DVT Prophylaxis: SCDs Family Communication: Discussed with patient and wife who understands and has no concerns at this time. Disposition Plan: Discharge once improved   Consultants:  General surgery- Dr. Lovell Sheehan   Nephrology- Salomon Mast, MD  Podiatry-  [name], DPM  Procedures:  Transfused 4 unit prbc  Antibiotics:  Clindamycin 9/7>>  Aztreonam 9/9>>  HPI/Subjective Pt is stressed and is communicating his thoughts about his amputation and his consultations with the surgery, nephrology and podiatry team. He is expressing his requests about rehabilitation. He does not have any further complaints at this time.  Objective: Filed Vitals:   08/10/15 0454  BP: 122/57  Pulse:  90  Temp: 98.1 F (36.7 C)  Resp: 18    Intake/Output Summary (Last 24 hours) at 08/10/15 0742 Last data filed at 08/10/15 8119  Gross per 24 hour  Intake   2880 ml  Output   2100 ml  Net    780 ml   Filed Weights   08/05/15 2254 08/06/15 0314  Weight: 112.492 kg (248 lb) 102.558 kg (226 lb 1.6 oz)    Exam: General:  No distress Cardiovascular: s1, s2, regular rate, no murmurs, rubs or gallops. No lower extremity edema in LLE. Respiratory: Clear to auscultation bilaterally. No wheezes, rales or rhonchi. Normal respiratory effort. Abdomen: soft, ntnd Skin: no rash or induration  Musculoskeletal: right leg is edematous and erythematous. Right foot has dressing in place that was not removed. Foul smell noted coming from right foot.   Data Reviewed: Basic Metabolic Panel:  Recent Labs Lab 08/06/15 0614 08/07/15 0611 08/08/15 0631 08/09/15 0610 08/10/15 0659  NA 125* 127* 127* 129* 128*  K 4.5 4.3 4.1 3.7 3.6  CL 96* 98* 97* 98* 98*  CO2 22 20* 20* 21* 21*  GLUCOSE 293* 168* 192* 108* 197*  BUN 59* 60* 63* 60* 59*  CREATININE 5.74* 5.36* 5.44* 5.48* 4.83*  CALCIUM 8.5* 8.3* 8.0* 7.9* 7.5*  PHOS  --  6.0* 5.5*  --   --    Liver Function Tests:  CBC:  Recent Labs Lab 08/05/15 2345 08/06/15 0614 08/07/15 0611 08/08/15 0631 08/10/15 0659  WBC 23.7* 18.3* 22.6* 25.9* 24.4*  NEUTROABS 19.5*  --   --   --   --   HGB 8.1* 6.8* 8.6* 10.0* 9.7*  HCT 24.1* 20.0* 25.2* 29.2* 28.0*  MCV 82.3 81.6 81.8 81.1 81.6  PLT  369 360 381 365 391   Cardiac Enzymes:  BNP (last 3 results)  ProBNP (last 3 results)  CBG:  Recent Labs Lab 08/08/15 2152 08/09/15 0720 08/09/15 1125 08/09/15 1623 08/09/15 2221  GLUCAP 134* 105* 238* 125* 226*    Recent Results (from the past 240 hour(s))  Blood culture (routine x 2)     Status: None (Preliminary result)   Collection Time: 08/06/15 12:50 AM  Result Value Ref Range Status   Specimen Description BLOOD RIGHT ANTECUBITAL   Final   Special Requests   Final    BOTTLES DRAWN AEROBIC AND ANAEROBIC AEB=8CC ANA=6CC   Culture NO GROWTH 3 DAYS  Final   Report Status PENDING  Incomplete  Blood culture (routine x 2)     Status: None (Preliminary result)   Collection Time: 08/06/15  1:03 AM  Result Value Ref Range Status   Specimen Description BLOOD LEFT ANTECUBITAL  Final   Special Requests   Final    BOTTLES DRAWN AEROBIC AND ANAEROBIC AEB 6CC ANA 10CC   Culture PENDING  Incomplete   Report Status PENDING  Incomplete  Surgical pcr screen     Status: None   Collection Time: 08/09/15 11:20 AM  Result Value Ref Range Status   MRSA, PCR NEGATIVE NEGATIVE Final   Staphylococcus aureus NEGATIVE NEGATIVE Final    Comment:        The Xpert SA Assay (FDA approved for NASAL specimens in patients over 34 years of age), is one component of a comprehensive surveillance program.  Test performance has been validated by [facility] for patients greater than or equal to 50 year old. It is not intended to diagnose infection nor to guide or monitor treatment.      Studies: No results found.  Scheduled Meds: . sodium chloride   Intravenous Once  . ALPRAZolam  0.5 mg Oral TID  . aztreonam  1 g Intravenous Q8H  . calcium acetate  667 mg Oral TID WC  . clindamycin (CLEOCIN) IV  600 mg Intravenous Q8H  . furosemide  40 mg Intravenous Daily  . insulin aspart  0-20 Units Subcutaneous TID WC  . insulin aspart  0-5 Units Subcutaneous QHS  . insulin glargine  42 Units Subcutaneous QHS  . iron polysaccharides  150 mg Oral BID  . metoprolol  50 mg Oral BID  . omega-3 acid ethyl esters  1,000 mg Oral BID  . pantoprazole  40 mg Oral Daily  . Vilazodone HCl  20 mg Oral Daily   Continuous Infusions: . sodium chloride 75 mL/hr at 08/10/15 0620    Principal Problem:   Osteomyelitis of right foot Active Problems:   Diabetic foot infection   Acute renal failure superimposed on stage 4 chronic kidney disease   Anemia,  chronic disease   Diabetes mellitus with complication   Essential hypertension   Hyponatremia   Diabetic ulcer of right foot   Leukocytosis    Time spent: 40 minutes  By signing my name below, I, Edman Circle attest that this documentation has been prepared under the direction and in the presence of Dr. Erick Blinks, M.D.   Electronically signed: Edman Circle  08/10/2015   Erick Blinks, M.D.  [facility] Pager [phone]. If 7PM-7AM, please contact night-coverage at [email or web address], password [facility] 08/10/2015, 7:42 AM  LOS: 4 days    I have reviewed the above documentation for accuracy and completeness, and I agree with the above.  Kieren Ricci

## 2015-08-10 NOTE — Progress Notes (Signed)
Inpatient Diabetes Program Recommendations  AACE/ADA: New Consensus Statement on Inpatient Glycemic Control (2013)  Target Ranges:  Prepandial:   less than 140 mg/dL      Peak postprandial:   less than 180 mg/dL (1-2 hours)      Critically ill patients:  140 - 180 mg/dL   Results for JAKOREY, MCCONATHY (MRN [id number]) as of 08/10/2015 10:47  Ref. Range 08/09/2015 07:20 08/09/2015 11:25 08/09/2015 16:23 08/09/2015 22:21 08/10/2015 07:41  Glucose-Capillary Latest Ref Range: 65-99 mg/dL 119 (H) 147 (H) 829 (H) 226 (H) 190 (H)   Current orders for Inpatient glycemic control: Lantus 42 units QHS, Novolog 0-20 units TID with meals, Novolog 0-5 units HS  Inpatient Diabetes Program Recommendations Insulin - Meal Coverage: Note patient is NPO at this time for surgery today. Once diet is resumed, please consider ordering Novolog 4 units TID with meals for meal coverage if patient is eating at least 50% of meals.  Thanks, Orlando Penner, RN, MSN, CCRN, CDE Diabetes Coordinator Inpatient Diabetes Program [phone] (Team Pager from 8am to 5pm) [phone] ([facility] office) [phone] [facility] office) [phone] [facility] office)

## 2015-08-10 NOTE — Progress Notes (Signed)
Subjective: Patient states that he has some nausea but no vomiting. Denies any difficulty in breathing  Objective: Vital signs in last 24 hours: Temp:  [98.1 F (36.7 C)-98.9 F (37.2 C)] 98.1 F (36.7 C) (09/12 0454) Pulse Rate:  [81-91] 90 (09/12 0454) Resp:  [18] 18 (09/12 0454) BP: (116-133)/(57-73) 122/57 mmHg (09/12 0454) SpO2:  [97 %-98 %] 98 % (09/12 0454)  Intake/Output from previous day: 09/11 0701 - 09/12 0700 In: 2880 [P.O.:720; I.V.:1860; IV Piggyback:300] Out: 2925 [Urine:2925] Intake/Output this shift:     Recent Labs  08/08/15 0631 08/10/15 0659  HGB 10.0* 9.7*    Recent Labs  08/08/15 0631 08/10/15 0659  WBC 25.9* 24.4*  RBC 3.60* 3.43*  HCT 29.2* 28.0*  PLT 365 391    Recent Labs  08/09/15 0610 08/10/15 0659  NA 129* 128*  K 3.7 3.6  CL 98* 98*  CO2 21* 21*  BUN 60* 59*  CREATININE 5.48* 4.83*  GLUCOSE 108* 197*  CALCIUM 7.9* 7.5*   No results for input(s): LABPT, INR in the last 72 hours.  Generally patient is alert and in no apparent distress Chest is clear to auscultation Heart exam revealed regular rate and rhythm no murmur Extremities he has 1+ edema on the right and no edema on the left  Assessment/Plan: Problem #1 acute kidney injury superimposed on chronic. His renal function is improving and returning to base line. Problem #2 chronic renal failure: Stage IV Problem #3 hyponatremia: His sodium has declined slightly but over all stable Problem #4 anemia: His status post blood transfusion. His hemoglobin is stable Problem #5 diabetes Problem #6 hypertension: His blood pressure is reasonably controlled Problem #7 metabolic bone disease: His calcium is range but his phosphorus is slightly high but improving and he is on a binder Problem #8 osteomyelitis of his right foot: Presently he is on anti-biotics . Patient for amputation today Plan: 1]We'll continue with present treatement 2]]We'll check his basic metabolic panel  and  CBC in the morning.   Anadalay Macdonell S 08/10/2015, 8:08 AM

## 2015-08-10 NOTE — Anesthesia Preprocedure Evaluation (Addendum)
Anesthesia Evaluation  Patient identified by MRN, date of birth, ID band Patient awake    Reviewed: Allergy & Precautions, H&P , NPO status , Patient's Chart, lab work & pertinent test results  Airway Mallampati: I  TM Distance: >3 FB     Dental  (+) Poor Dentition, Dental Advisory Given   Pulmonary neg pulmonary ROS, sleep apnea , Current Smoker, former smoker,    breath sounds clear to auscultation       Cardiovascular hypertension, Pt. on medications + dysrhythmias  Rhythm:Regular Rate:Normal     Neuro/Psych PSYCHIATRIC DISORDERS Anxiety Depression    GI/Hepatic hiatal hernia, GERD  Medicated,  Endo/Other  diabetes, Poorly Controlled, Type 2, Insulin Dependent  Renal/GU CRFRenal disease     Musculoskeletal   Abdominal   Peds  Hematology  (+) anemia ,   Anesthesia Other Findings   Reproductive/Obstetrics                            Anesthesia Physical Anesthesia Plan  ASA: IV  Anesthesia Plan: General   Post-op Pain Management:    Induction: Intravenous, Rapid sequence and Cricoid pressure planned  Airway Management Planned: Oral ETT  Additional Equipment:   Intra-op Plan:   Post-operative Plan: Extubation in OR  Informed Consent: I have reviewed the patients History and Physical, chart, labs and discussed the procedure including the risks, benefits and alternatives for the proposed anesthesia with the patient or authorized representative who has indicated his/her understanding and acceptance.     Plan Discussed with:   Anesthesia Plan Comments:         Anesthesia Quick Evaluation

## 2015-08-10 NOTE — Op Note (Signed)
Patient:  Maurice [name]  DOB:  05/10/1976  MRN:  [id number]   Preop Diagnosis:  Gangrene, right foot  Postop Diagnosis:  Same  Procedure:  Right above-the-knee amputation  Surgeon:  Franky Macho, M.D.  Anes:  Gen. endotracheal  Indications:  Patient is a 39 year old white male with multiple medical problems including uncontrolled diabetes mellitus, acute renal failure, anemia who has undergone multiple procedures on his right foot due to frequent ulcerations. This last episode he presented with ischemia of the right foot. It has progressed to gangrene. An ultrasound of the right lower extremity arterial blood supply revealed some stenosis at the level of the popliteal artery. Human all these findings, it was elected to proceed with a right above-the-knee amputation. Risks and benefits of the procedure including bleeding, infection, wound breakdown, the possibility of a blood transfusion were fully explained to the patient, who gave informed consent.  Procedure note:  The patient is placed the supine position. After induction of general endotracheal anesthesia, the right lower extremity was prepped and draped using the usual sterile technique with PCMX. Surgical site confirmation was performed.  A fishmouth incision was made above the right knee. The dissection was taken down to the femur. The femur was divided using a saw without difficulty. The femoral artery, vein, and tributaries were all suture ligated using 0 silk suture ligatures. The soft tissue was divided using Bovie electrocautery. The sciatic nerve was identified and a high ligation was performed and was suture ligated using an 0 silk suture. The posterior soft tissue was divided and the right lower extremity was removed and sent to pathology further examination. Bone wax was used on the distal femur. A bleeding was controlled using suture ligatures. The wound was irrigated normal saline. Good blood flow was noted in the tissue. The  anterior and posterior fascial edges were reapproximated using 2-0 Vicryl interrupted sutures. The skin was closed using staples. Betadine ointment and dry sterile dressings were applied.  All tape and needle counts were correct at the end of the procedure.  The patient was transferred to PACU in stable condition.    Complications:  None  EBL:  500 mL  Specimen:  Right lower extremity  One unit packed red blood cells transfused

## 2015-08-11 ENCOUNTER — Encounter (HOSPITAL_COMMUNITY): Payer: Self-pay | Admitting: General Surgery

## 2015-08-11 LAB — BASIC METABOLIC PANEL
ANION GAP: 10 (ref 5–15)
BUN: 56 mg/dL — ABNORMAL HIGH (ref 6–20)
CO2: 18 mmol/L — ABNORMAL LOW (ref 22–32)
Calcium: 6.6 mg/dL — ABNORMAL LOW (ref 8.9–10.3)
Chloride: 103 mmol/L (ref 101–111)
Creatinine, Ser: 4.56 mg/dL — ABNORMAL HIGH (ref 0.61–1.24)
GFR calc Af Amer: 17 mL/min — ABNORMAL LOW (ref >60)
GFR, EST NON AFRICAN AMERICAN: 15 mL/min — AB (ref >60)
GLUCOSE: 105 mg/dL — AB (ref 65–99)
POTASSIUM: 3.5 mmol/L (ref 3.5–5.1)
Sodium: 131 mmol/L — ABNORMAL LOW (ref 135–145)

## 2015-08-11 LAB — CULTURE, BLOOD (ROUTINE X 2)
CULTURE: NO GROWTH
Culture: NO GROWTH

## 2015-08-11 LAB — GLUCOSE, CAPILLARY
GLUCOSE-CAPILLARY: 103 mg/dL — AB (ref 65–99)
Glucose-Capillary: 105 mg/dL — ABNORMAL HIGH (ref 65–99)
Glucose-Capillary: 112 mg/dL — ABNORMAL HIGH (ref 65–99)
Glucose-Capillary: 119 mg/dL — ABNORMAL HIGH (ref 65–99)

## 2015-08-11 LAB — CBC
HCT: 24.6 % — ABNORMAL LOW (ref 39.0–52.0)
Hemoglobin: 8.5 g/dL — ABNORMAL LOW (ref 13.0–17.0)
MCH: 28 pg (ref 26.0–34.0)
MCHC: 34.6 g/dL (ref 30.0–36.0)
MCV: 80.9 fL (ref 78.0–100.0)
PLATELETS: 407 10*3/uL — AB (ref 150–400)
RBC: 3.04 MIL/uL — AB (ref 4.22–5.81)
RDW: 15.3 % (ref 11.5–15.5)
WBC: 18.5 10*3/uL — AB (ref 4.0–10.5)

## 2015-08-11 MED ORDER — PROMETHAZINE HCL 25 MG/ML IJ SOLN
25.0000 mg | Freq: Four times a day (QID) | INTRAMUSCULAR | Status: DC | PRN
  Administered 2015-08-11 – 2015-08-12 (×5): 25 mg via INTRAVENOUS
  Filled 2015-08-11 (×5): qty 1

## 2015-08-11 MED ORDER — HYDROMORPHONE HCL 1 MG/ML IJ SOLN
2.0000 mg | INTRAMUSCULAR | Status: DC | PRN
  Administered 2015-08-11 – 2015-08-12 (×10): 2 mg via INTRAVENOUS
  Filled 2015-08-11 (×10): qty 2

## 2015-08-11 NOTE — Evaluation (Signed)
Physical Therapy Evaluation Patient Details Name: Maurice [name] MRN: [id number] DOB: [birth year] Today's Date: 08/11/2015   History of Present Illness  Pt is a 39 year old male who was admitted with osteomyelitis of the right foot.  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  He states that his foot had multiple wounds and he has had very limited gait for the past year while trying to heal the foot.  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  He is, in fact, on a transplant list for a kidney.  He lives with his wife and normally is able to dress and bathe himself.  Clinical Impression   Pt was seen for evaluation.  He was premedicated for PT visit, wife present.  He was alert and oriented, able to follow all directions.  He appeared to be profoundly depressed over his medical situation/new amputation.  He lives with chronic pain and therefore the current pain meds are not helping to control his pain as well as one would hope.  The evaluation today was limited due to his pain.  He was unable to lie in supine for more than about 10 minutes due to pain in the stump and his back.  He was unable to try to mobilize out of bed.  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  I instructed him in positioning of the RLE to prevent contracture and emphasized getting his hip fully extended  Some of the time.  I also instructed in left foot pump, quad sets and gluteal sets.  I am strongly recommending Inpatient Rehab for pt at time of discharge.  They live in [place], [place]. So this center would be preferred.    Follow Up Recommendations CIR [facility] inpatient Rehab)    Equipment Recommendations  None recommended by PT (to obtain in rehab)    Recommendations for Other Services   OT    Precautions / Restrictions  Precautions Precautions: Fall Restrictions Weight Bearing Restrictions: No      Mobility  Bed Mobility Overal bed mobility: Needs Assistance Bed Mobility: Supine to Sit     Supine to sit: HOB elevated;Mod assist     General bed mobility comments: HOB at 40 degrees, pt need mod assist to pull forward to sit at EOB  Transfers                 General transfer comment: unable to transfer out of bed due to severe pain  Ambulation/Gait             General Gait Details: unable                       Balance Overall balance assessment:  (good sitting balance)                                           Pertinent Vitals/Pain Pain Assessment: 0-10 Pain Score: 7  Pain Location: "all over" Pain Descriptors / Indicators: Aching;Stabbing;Cramping Pain Intervention(s): Limited activity within patient's tolerance;Premedicated before session    Home Living Family/patient expects to be discharged to:: Inpatient rehab Living Arrangements: Spouse/significant other  Prior Function Level of Independence: Independent                       Extremity/Trunk Assessment               Lower Extremity Assessment: RLE deficits/detail;LLE deficits/detail RLE Deficits / Details: new right AKA...pt is able to lie supine with HOB elevated to 30 degrees but is unable to lie flat with hip in full extension...he is more comfortable with the hip in full flexion...pain has been such a huge issue for him that I could not actually touch his stump to check full ROM...he does c/o phantom pain LLE Deficits / Details: significant weakness throughout the entire leg to about 3-/5 in the hip, 3/5 in the quad, 3-/5 in hamstrings, 2/5 in DF/PF  Cervical / Trunk Assessment:  (very weak trunk muscles needing assist to move trunk anteriorly)  Communication   Communication: No difficulties  Cognition Arousal/Alertness:  Awake/alert Behavior During Therapy: Flat affect Overall Cognitive Status: Within Functional Limits for tasks assessed                            Exercises Amputee Exercises Quad Sets: AROM;Both;5 reps;Supine Gluteal Sets: AROM;Both;5 reps;Seated Other Exercises Other Exercises: left ankle pump x 10 repetitions      Assessment/Plan    PT Assessment Patient needs continued PT services  PT Diagnosis Difficulty walking;Generalized weakness;Acute pain   PT Problem List Decreased strength;Decreased activity tolerance;Decreased balance;Decreased mobility;Decreased knowledge of use of DME;Decreased knowledge of precautions;Impaired sensation;Pain  PT Treatment Interventions Functional mobility training;Therapeutic activities;Therapeutic exercise;Patient/family education   PT Goals (Current goals can be found in the Care Plan section) Acute Rehab PT Goals Patient Stated Goal: none stated PT Goal Formulation: With patient/family Time For Goal Achievement: 08/25/15 Potential to Achieve Goals: Good    Frequency Min 6X/week   Barriers to discharge Decreased caregiver support wife works daytime hours                   End of Session   Activity Tolerance: Patient limited by pain Patient left: in bed;with call bell/phone within reach;with family/visitor present Nurse Communication: Mobility status         Time: 1610-9604 PT Time Calculation (min) (ACUTE ONLY): 41 min   Charges:   PT Evaluation $Initial PT Evaluation Tier I: 1 Procedure PT Treatments $Self Care/Home Management: 8-22   PT G CodesMyrlene Broker L  PT 08/11/2015, 12:07 PM [phone]

## 2015-08-11 NOTE — Progress Notes (Signed)
1 Day Post-Op  Subjective: Patient having significant incisional pain.  Objective: Vital signs in last 24 hours: Temp:  [97.6 F (36.4 C)-98.8 F (37.1 C)] 97.9 F (36.6 C) (09/13 0330) Pulse Rate:  [72-101] 76 (09/13 0330) Resp:  [13-26] 18 (09/13 0330) BP: (131-186)/(72-88) 149/81 mmHg (09/13 0330) SpO2:  [94 %-100 %] 100 % (09/13 0330) Last BM Date: 08/09/15  Intake/Output from previous day: 09/12 0701 - 09/13 0700 In: 2409.5 [I.V.:1912.5; Blood:297; IV Piggyback:200] Out: 500 [Blood:500] Intake/Output this shift:    General appearance: alert and cooperative Extremities: Right lower extremity dressing intact.  Lab Results:   Recent Labs  08/10/15 0659  08/10/15 1733 08/11/15 0641  WBC 24.4*  --   --  18.5*  HGB 9.7*  < > 9.5* 8.5*  HCT 28.0*  < > 27.9* 24.6*  PLT 391  --   --  407*  < > = values in this interval not displayed. BMET  Recent Labs  08/10/15 0659 08/11/15 0641  NA 128* 131*  K 3.6 3.5  CL 98* 103  CO2 21* 18*  GLUCOSE 197* 105*  BUN 59* 56*  CREATININE 4.83* 4.56*  CALCIUM 7.5* 6.6*   PT/INR No results for input(s): LABPROT, INR in the last 72 hours.  Studies/Results: No results found.  Anti-infectives: Anti-infectives    Start     Dose/Rate Route Frequency Ordered Stop   08/07/15 0900  aztreonam (AZACTAM) 1 g in dextrose 5 % 50 mL IVPB     1 g 100 mL/hr over 30 Minutes Intravenous Every 8 hours 08/07/15 0740     08/06/15 1000  clindamycin (CLEOCIN) IVPB 600 mg     600 mg 100 mL/hr over 30 Minutes Intravenous Every 8 hours 08/06/15 0922     08/06/15 0045  clindamycin (CLEOCIN) IVPB 600 mg     600 mg 100 mL/hr over 30 Minutes Intravenous  Once 08/06/15 0036 08/06/15 0202      Assessment/Plan: s/p Procedure(s): RIGHT LEG ABOVE KNEE AMPUTATION Impression: Stable on postoperative day 1. Patient has a known history of narcotic use and I will increase the Dilaudid. Hemoglobin is okay from surgery standpoint. He did receive one  unit of packed red blood cells during the surgery. I will defer to medicine and nephrology concerning any further transfusion needs. Continue IV antibiotics as ordered. Physical therapy to start today.  LOS: 5 days    Onell Mcmath A 08/11/2015

## 2015-08-11 NOTE — Anesthesia Postprocedure Evaluation (Signed)
  Anesthesia Post-op Note  Patient: Maurice [name]  Procedure(s) Performed: Procedure(s): RIGHT LEG ABOVE KNEE AMPUTATION (Right)  Patient Location: Nursing Unit  Anesthesia Type:General  Level of Consciousness: awake, alert  and oriented  Airway and Oxygen Therapy: Patient Spontanous Breathing  Post-op Pain: mild  Post-op Assessment: Post-op Vital signs reviewed, Patient's Cardiovascular Status Stable, Respiratory Function Stable, Patent Airway and No signs of Nausea or vomiting              Post-op Vital Signs: Reviewed and stable  Last Vitals:  Filed Vitals:   08/11/15 0933  BP: 151/91  Pulse: 96  Temp: 36.6 C  Resp: 16    Complications: No apparent anesthesia complications, biggest problem post op was pain control.  PACU nurse states she had given a large amount of pain medication, but massage to the side of the thigh helped more and the patient had stated that felt better.  Muscle cramping suspected and flexeril ordered on the floor.  Pain controlled more today and at a tolerable level.

## 2015-08-11 NOTE — Care Management Note (Signed)
Case Management Note  Patient Details  Name: Maurice [name] MRN: [id number] Date of Birth: Sep 16, 1976  Expected Discharge Date:  08/13/2015              Expected Discharge Plan:  IP Rehab Facility  In-House Referral:  NA  Discharge planning Services  CM Consult  Post Acute Care Choice:  Resumption of Svcs/PTA Provider Choice offered to:  Patient  DME Arranged:    DME Agency:     HH Arranged:    HH Agency:     Status of Service:  In process, will continue to follow  Medicare Important Message Given:  Yes-second notification given Date Medicare IM Given:    Medicare IM give by:    Date Additional Medicare IM Given:    Additional Medicare Important Message give by:     If discussed at Long Length of Stay Meetings, dates discussed:    Additional Comments: Pt having complaints of pain today following AKA. PT has recommended CIR. Pt's wife works at [employer] and requests he go there for therapy. Pt is hesitant about placement and would prefer to go home but understands intense therapy will be his best hope for faster recovery with less complications. Pt agreeable for CM to contact DR CIR and begin referral process. Meriam Sprague contacted and left VM to return call about new referral. Will begin referral process after OT eval complete and call returned from [facility].  Malcolm Metro, RN 08/11/2015, 1:36 PM

## 2015-08-11 NOTE — Care Management Note (Signed)
Case Management Note  Patient Details  Name: Maurice [name] MRN: [id number] Date of Birth: 1976-01-11  Subjective/Objective:                    Action/Plan:   Expected Discharge Date:  08/10/15               Expected Discharge Plan:  IP Rehab Facility  In-House Referral:  NA  Discharge planning Services  CM Consult  Post Acute Care Choice:  Resumption of Svcs/PTA Provider Choice offered to:  Patient  DME Arranged:    DME Agency:     HH Arranged:    HH Agency:     Status of Service:  In process, will continue to follow  Medicare Important Message Given:  Yes-second notification given Date Medicare IM Given:    Medicare IM give by:    Date Additional Medicare IM Given:    Additional Medicare Important Message give by:     If discussed at Long Length of Stay Meetings, dates discussed:  08/11/2015  Additional Comments: Call returned from [place] at [facility]. Pt info faxed and referral began. Pt still needs OT referral, OT consult placed.  Malcolm Metro, RN 08/11/2015, 1:47 PM

## 2015-08-11 NOTE — Progress Notes (Signed)
Maurice [name]  MRN: [id number]  DOB/AGE: December 16, 1975 38 y.o.  Primary Care Physician:PROVIDER NOT IN SYSTEM  Admit date: 08/05/2015  Chief Complaint:  Chief Complaint  Patient presents with  . Foot Ulcer    S-Pt presented on  08/05/2015 with  Chief Complaint  Patient presents with  . Foot Ulcer  .    Pt main concern is pain at the amputation site.    Pt and wife asked about his blood results. i tried to explain the results to best of my ability.   Meds . sodium chloride   Intravenous Once  . sodium chloride   Intravenous Once  . ALPRAZolam  0.5 mg Oral TID  . aztreonam  1 g Intravenous Q8H  . calcium acetate  667 mg Oral TID WC  . clindamycin (CLEOCIN) IV  600 mg Intravenous Q8H  . furosemide  40 mg Intravenous Daily  . insulin aspart  0-20 Units Subcutaneous TID WC  . insulin aspart  0-5 Units Subcutaneous QHS  . insulin glargine  42 Units Subcutaneous QHS  . iron polysaccharides  150 mg Oral BID  . metoprolol  50 mg Oral BID  . omega-3 acid ethyl esters  1,000 mg Oral BID  . pantoprazole  40 mg Oral Daily  . Vilazodone HCl  20 mg Oral Daily      Physical Exam: Vital signs in last 24 hours: Temp:  [97.6 F (36.4 C)-98.8 F (37.1 C)] 97.9 F (36.6 C) (09/13 0330) Pulse Rate:  [72-101] 76 (09/13 0330) Resp:  [13-26] 18 (09/13 0330) BP: (131-186)/(72-88) 149/81 mmHg (09/13 0330) SpO2:  [94 %-100 %] 100 % (09/13 0330) Weight change:  Last BM Date: 08/10/15  Intake/Output from previous day: 09/12 0701 - 09/13 0700 In: 2409.5 [I.V.:1912.5; Blood:297; IV Piggyback:200] Out: 500 [Blood:500]     Physical Exam: General- pt is awake,alert, oriented to time place and person Resp- No acute REsp distress, CTA B/L NO Rhonchi CVS- S1S2 regular in rate and rhythm GIT- BS+, soft, NT, ND EXT- NO LE Edema, NO Cyanosis          Right AKA+  Lab Results: CBC  Recent Labs  08/10/15 0659  08/10/15 1733 08/11/15 0641  WBC 24.4*  --   --  18.5*  HGB 9.7*  < > 9.5*  8.5*  HCT 28.0*  < > 27.9* 24.6*  PLT 391  --   --  407*  < > = values in this interval not displayed.  BMET  Recent Labs  08/10/15 0659 08/11/15 0641  NA 128* 131*  K 3.6 3.5  CL 98* 103  CO2 21* 18*  GLUCOSE 197* 105*  BUN 59* 56*  CREATININE 4.83* 4.56*  CALCIUM 7.5* 6.6*   Creat trend 2016  5.97=>4.83=>4.56           6.2=>4.5 ( Earlier admission)  2014   1.72  MICRO Recent Results (from the past 240 hour(s))  Blood culture (routine x 2)     Status: None (Preliminary result)   Collection Time: 08/06/15 12:50 AM  Result Value Ref Range Status   Specimen Description BLOOD RIGHT ANTECUBITAL  Final   Special Requests   Final    BOTTLES DRAWN AEROBIC AND ANAEROBIC AEB=8CC ANA=6CC   Culture NO GROWTH 4 DAYS  Final   Report Status PENDING  Incomplete  Blood culture (routine x 2)     Status: None (Preliminary result)   Collection Time: 08/06/15  1:03 AM  Result Value Ref Range Status  Specimen Description BLOOD LEFT ANTECUBITAL  Final   Special Requests   Final    BOTTLES DRAWN AEROBIC AND ANAEROBIC AEB 6CC ANA 10CC   Culture PENDING  Incomplete   Report Status PENDING  Incomplete  Surgical pcr screen     Status: None   Collection Time: 08/09/15 11:20 AM  Result Value Ref Range Status   MRSA, PCR NEGATIVE NEGATIVE Final   Staphylococcus aureus NEGATIVE NEGATIVE Final    Comment:        The Xpert SA Assay (FDA approved for NASAL specimens in patients over 41 years of age), is one component of a comprehensive surveillance program.  Test performance has been validated by [facility] for patients greater than or equal to 57 year old. It is not intended to diagnose infection nor to guide or monitor treatment.       Lab Results  Component Value Date   CALCIUM 6.6* 08/11/2015   PHOS 5.5* 08/10/2015   Albumin 1.6 Corrected Calcium 6.6+1.2=7.8            Impression: 1)Renal  AKI secondary to  ATN                AKI on CKD               CKD  stage 4.               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     On BInders Calcium low when correcetd for low albumin     On calcium based binders   5)ID-admitted with osteomyelitis S/p amputtaion Primary MD following  6)Electrolytes  Normokalemic  Hyponatremic     improving   7)Acid base Co2 at goal     Plan:  Will continue current tx Will recheck albumin, if low after correction will add calcium carbonate     Brentin Shin S 08/11/2015, 9:36 AM

## 2015-08-11 NOTE — Progress Notes (Signed)
[facility] PROGRESS NOTE  Maurice [name] UEA:[id number] DOB: 03/26/1976 DOA: 08/05/2015 PCP: PROVIDER NOT IN SYSTEM   Summary  38 yom presented with complaints of osteomyelitis of right foot. After evaluation by general surgery and podiatry it was agreed upon with the patient to proceed with above knee amputation, which occurred on 9/12. He is continued on IV abx. Nephrology is also following due to acute on chronic renal failure. He has been transfused 5 units of PRBC thus far. All other issues are improving with IVF and lasix. Anticipate discharge in the next few days.   Assessment/Plan: 1. Osteomyelitis of right foot. MRI foot show extensive changes consistent with osteomyelitis. Above knee amputation 9/12. Will continue on IV clindamycin and aztreonam. Per general surgery, physical therapy will start today and will increase Dilaudid.   2. DM, type 2. CBG stable. Contnue SSI. 3. Acute renal failure, superimposed on stage 4 chronic kidney disease. Nephrology recommended following current treatment. Will continue on IVF and IV lasix. Creatinine is starting to approach baseline.  4. Leukocytosis. WBC continues to trend down 18.5 today, likely related to infections. Continue abx.  5. Hyponatremia, likely related hypovolemia .Will continue IVF and lasix. Sodium continues to improve. Continue to monitor. 6. Anemia, chronic disease. Presented with Hgb at 6.8. He was transfused 4 units PRBCs since addmision with improvement of Hgb to 10. Received another unit of PRBCs during surgery S/p above knee amputation Hgb is 8.5. Will recheck in the am and consider transfusion if drops below 8. Will continue Iron supplement. Continue to monitor. 7. Essential Hypertension. Blood pressure currently stable, continue metoprolol.  8. GERD. Will continue Protonix.   Code Status: Full DVT Prophylaxis: SCDs Family Communication: Wife bedside. Discussed with patient who understands and has no concerns at this  time. Disposition Plan: Anticipate discharge in 2-3 days.    Consultants:  General surgery- Dr. Lovell Sheehan   Nephrology- Salomon Mast, MD  Podiatry-  [name], DPM  Procedures:  Transfused 5 unit prbc  Right above-the-knee amputation  Antibiotics:  Clindamycin 9/7>>  Aztreonam 9/9>>  HPI/Subjective Reports phantom pain at amputation site. Nausea is consistent and he is unable to eat and sleep.    Family member bedside states that he has not been eating and sleep. He is also talking out of his head due to lack of sleep and changes in medication. He has been able to void with Lasix. Sitting up has been more comfortable for him. There has been noticeable bleeding at the incision site.  Objective: Filed Vitals:   08/11/15 0330  BP: 149/81  Pulse: 76  Temp: 97.9 F (36.6 C)  Resp: 18    Intake/Output Summary (Last 24 hours) at 08/11/15 0808 Last data filed at 08/11/15 0500  Gross per 24 hour  Intake 2409.5 ml  Output    500 ml  Net 1909.5 ml   Filed Weights   08/05/15 2254 08/06/15 0314  Weight: 112.492 kg (248 lb) 102.558 kg (226 lb 1.6 oz)    Exam: General: NAD  Cardiovascular: RRR, S1, S2  Respiratory: [name] bilaterally, No wheezing, rales or rhnochi Abdomen: soft, non tender, no distention , bowel sounds normal Musculoskeletal: Dressing on right AKA  Data Reviewed: Basic Metabolic Panel:  Recent Labs Lab 08/07/15 0611 08/08/15 0631 08/09/15 0610 08/10/15 0659 08/11/15 0641  NA 127* 127* 129* 128* 131*  K 4.3 4.1 3.7 3.6 3.5  CL 98* 97* 98* 98* 103  CO2 20* 20* 21* 21* 18*  GLUCOSE 168* 192* 108*  197* 105*  BUN 60* 63* 60* 59* 56*  CREATININE 5.36* 5.44* 5.48* 4.83* 4.56*  CALCIUM 8.3* 8.0* 7.9* 7.5* 6.6*  PHOS 6.0* 5.5*  --  5.5*  --    Liver Function Tests:  CBC:  Recent Labs Lab 08/05/15 2345 08/06/15 0614 08/07/15 0611 08/08/15 0631 08/10/15 0659 08/10/15 1540 08/10/15 1733 08/11/15 0641  WBC 23.7* 18.3* 22.6*  25.9* 24.4*  --   --  18.5*  NEUTROABS 19.5*  --   --   --   --   --   --   --   HGB 8.1* 6.8* 8.6* 10.0* 9.7* 9.8* 9.5* 8.5*  HCT 24.1* 20.0* 25.2* 29.2* 28.0* 28.7* 27.9* 24.6*  MCV 82.3 81.6 81.8 81.1 81.6  --   --  80.9  PLT 369 360 381 365 391  --   --  407*   Cardiac Enzymes:  BNP (last 3 results)  ProBNP (last 3 results)  CBG:  Recent Labs Lab 08/10/15 1122 08/10/15 1532 08/10/15 1647 08/10/15 2056 08/11/15 0747  GLUCAP 128* 113* 134* 164* 103*    Recent Results (from the past 240 hour(s))  Blood culture (routine x 2)     Status: None (Preliminary result)   Collection Time: 08/06/15 12:50 AM  Result Value Ref Range Status   Specimen Description BLOOD RIGHT ANTECUBITAL  Final   Special Requests   Final    BOTTLES DRAWN AEROBIC AND ANAEROBIC AEB=8CC ANA=6CC   Culture NO GROWTH 4 DAYS  Final   Report Status PENDING  Incomplete  Blood culture (routine x 2)     Status: None (Preliminary result)   Collection Time: 08/06/15  1:03 AM  Result Value Ref Range Status   Specimen Description BLOOD LEFT ANTECUBITAL  Final   Special Requests   Final    BOTTLES DRAWN AEROBIC AND ANAEROBIC AEB 6CC ANA 10CC   Culture PENDING  Incomplete   Report Status PENDING  Incomplete  Surgical pcr screen     Status: None   Collection Time: 08/09/15 11:20 AM  Result Value Ref Range Status   MRSA, PCR NEGATIVE NEGATIVE Final   Staphylococcus aureus NEGATIVE NEGATIVE Final    Comment:        The Xpert SA Assay (FDA approved for NASAL specimens in patients over 52 years of age), is one component of a comprehensive surveillance program.  Test performance has been validated by [facility] for patients greater than or equal to 28 year old. It is not intended to diagnose infection nor to guide or monitor treatment.      Studies: No results found.  Scheduled Meds: . sodium chloride   Intravenous Once  . sodium chloride   Intravenous Once  . ALPRAZolam  0.5 mg Oral TID  .  aztreonam  1 g Intravenous Q8H  . calcium acetate  667 mg Oral TID WC  . clindamycin (CLEOCIN) IV  600 mg Intravenous Q8H  . furosemide  40 mg Intravenous Daily  . insulin aspart  0-20 Units Subcutaneous TID WC  . insulin aspart  0-5 Units Subcutaneous QHS  . insulin glargine  42 Units Subcutaneous QHS  . iron polysaccharides  150 mg Oral BID  . metoprolol  50 mg Oral BID  . omega-3 acid ethyl esters  1,000 mg Oral BID  . pantoprazole  40 mg Oral Daily  . Vilazodone HCl  20 mg Oral Daily   Continuous Infusions: . sodium chloride 75 mL/hr at 08/11/15 0500    Principal Problem:  Osteomyelitis of right foot Active Problems:   Diabetic foot infection   Acute renal failure superimposed on stage 4 chronic kidney disease   Anemia, chronic disease   Diabetes mellitus with complication   Essential hypertension   Hyponatremia   Diabetic ulcer of right foot   Leukocytosis    Time spent: 35 minutes  Erick Blinks, M.D.  [facility] Pager [phone]. If 7PM-7AM, please contact night-coverage at [email or web address], password [facility] 08/11/2015, 8:08 AM  LOS: 5 days     By signing my name below, I, Zadie Cleverly, attest that this documentation has been prepared under the direction and in the presence of Erick Blinks, MD. Electronically signed: Zadie Cleverly, Scribe. 08/11/2015  I have reviewed the above documentation for accuracy and completeness, and I agree with the above.  MEMON,JEHANZEB

## 2015-08-11 NOTE — Progress Notes (Signed)
Patient attempted to sit up on side of bed when original surgical dressing came off, small amount of blood noted, staples remain intact, right stump redressed with 4x4's, covered with ABD pad and ace wrapped.

## 2015-08-11 NOTE — Addendum Note (Signed)
Addendum  created 08/11/15 1235 by Moshe Salisbury, CRNA   Modules edited: Notes Section   Notes Section:  File: [id number]

## 2015-08-12 DIAGNOSIS — M869 Osteomyelitis, unspecified: Principal | ICD-10-CM

## 2015-08-12 DIAGNOSIS — L089 Local infection of the skin and subcutaneous tissue, unspecified: Secondary | ICD-10-CM

## 2015-08-12 DIAGNOSIS — N179 Acute kidney failure, unspecified: Secondary | ICD-10-CM

## 2015-08-12 DIAGNOSIS — E1169 Type 2 diabetes mellitus with other specified complication: Secondary | ICD-10-CM

## 2015-08-12 DIAGNOSIS — N184 Chronic kidney disease, stage 4 (severe): Secondary | ICD-10-CM

## 2015-08-12 DIAGNOSIS — E118 Type 2 diabetes mellitus with unspecified complications: Secondary | ICD-10-CM

## 2015-08-12 LAB — GLUCOSE, CAPILLARY
GLUCOSE-CAPILLARY: 147 mg/dL — AB (ref 65–99)
Glucose-Capillary: 103 mg/dL — ABNORMAL HIGH (ref 65–99)
Glucose-Capillary: 168 mg/dL — ABNORMAL HIGH (ref 65–99)
Glucose-Capillary: 85 mg/dL (ref 65–99)

## 2015-08-12 LAB — BASIC METABOLIC PANEL
Anion gap: 12 (ref 5–15)
BUN: 53 mg/dL — AB (ref 6–20)
CHLORIDE: 104 mmol/L (ref 101–111)
CO2: 16 mmol/L — AB (ref 22–32)
Calcium: 6.3 mg/dL — CL (ref 8.9–10.3)
Creatinine, Ser: 4.21 mg/dL — ABNORMAL HIGH (ref 0.61–1.24)
GFR calc Af Amer: 19 mL/min — ABNORMAL LOW (ref >60)
GFR calc non Af Amer: 16 mL/min — ABNORMAL LOW (ref >60)
GLUCOSE: 101 mg/dL — AB (ref 65–99)
POTASSIUM: 3.4 mmol/L — AB (ref 3.5–5.1)
Sodium: 132 mmol/L — ABNORMAL LOW (ref 135–145)

## 2015-08-12 LAB — CBC
HEMATOCRIT: 23.5 % — AB (ref 39.0–52.0)
Hemoglobin: 8.2 g/dL — ABNORMAL LOW (ref 13.0–17.0)
MCH: 28.3 pg (ref 26.0–34.0)
MCHC: 34.9 g/dL (ref 30.0–36.0)
MCV: 81 fL (ref 78.0–100.0)
Platelets: 446 10*3/uL — ABNORMAL HIGH (ref 150–400)
RBC: 2.9 MIL/uL — ABNORMAL LOW (ref 4.22–5.81)
RDW: 15.3 % (ref 11.5–15.5)
WBC: 15.4 10*3/uL — ABNORMAL HIGH (ref 4.0–10.5)

## 2015-08-12 LAB — ALBUMIN: Albumin: 1.6 g/dL — ABNORMAL LOW (ref 3.5–5.0)

## 2015-08-12 MED ORDER — CALCIUM CARBONATE 1250 (500 CA) MG PO TABS
1.0000 | ORAL_TABLET | Freq: Every day | ORAL | Status: DC
  Administered 2015-08-13 – 2015-08-14 (×3): 500 mg via ORAL
  Filled 2015-08-12 (×3): qty 1

## 2015-08-12 MED ORDER — POTASSIUM CHLORIDE 20 MEQ PO PACK
20.0000 meq | PACK | Freq: Once | ORAL | Status: AC
  Administered 2015-08-12: 20 meq via ORAL
  Filled 2015-08-12: qty 1

## 2015-08-12 MED ORDER — HYDROMORPHONE HCL 1 MG/ML IJ SOLN: 1.0000 mg | INTRAMUSCULAR | Status: DC | PRN

## 2015-08-12 NOTE — Progress Notes (Signed)
2 Days Post-Op  Subjective: Complains of incisional pain.  Objective: Vital signs in last 24 hours: Temp:  [97.8 F (36.6 C)-98.7 F (37.1 C)] 97.8 F (36.6 C) (09/14 0900) Pulse Rate:  [74-94] 94 (09/14 0900) Resp:  [16-20] 20 (09/14 0900) BP: (141-167)/(75-89) 167/87 mmHg (09/14 0900) SpO2:  [100 %] 100 % (09/14 0900) Last BM Date: 08/11/15  Intake/Output from previous day: 09/13 0701 - 09/14 0700 In: 2528.8 [P.O.:480; I.V.:1848.8; IV Piggyback:200] Out: 1950 [Urine:1950] Intake/Output this shift: Total I/O In: 240 [P.O.:240] Out: -   General appearance: alert and cooperative Extremities: Right above-knee amputation site healing well. No significant drainage noted.  Lab Results:   Recent Labs  08/11/15 0641 08/12/15 0655  WBC 18.5* 15.4*  HGB 8.5* 8.2*  HCT 24.6* 23.5*  PLT 407* 446*   BMET  Recent Labs  08/11/15 0641 08/12/15 0655  NA 131* 132*  K 3.5 3.4*  CL 103 104  CO2 18* 16*  GLUCOSE 105* 101*  BUN 56* 53*  CREATININE 4.56* 4.21*  CALCIUM 6.6* 6.3*   PT/INR No results for input(s): LABPROT, INR in the last 72 hours.  Studies/Results: No results found.  Anti-infectives: Anti-infectives    Start     Dose/Rate Route Frequency Ordered Stop   08/07/15 0900  aztreonam (AZACTAM) 1 g in dextrose 5 % 50 mL IVPB     1 g 100 mL/hr over 30 Minutes Intravenous Every 8 hours 08/07/15 0740     08/06/15 1000  clindamycin (CLEOCIN) IVPB 600 mg     600 mg 100 mL/hr over 30 Minutes Intravenous Every 8 hours 08/06/15 0922     08/06/15 0045  clindamycin (CLEOCIN) IVPB 600 mg     600 mg 100 mL/hr over 30 Minutes Intravenous  Once 08/06/15 0036 08/06/15 0202      Assessment/Plan: s/p Procedure(s): RIGHT LEG ABOVE KNEE AMPUTATION Impression: Stable from surgical standpoint. Hemoglobin is stable. He is okay for transfer to rehabilitation center when cleared by medicine. He should be switched over to oral narcotics for pain control. A stump shrinker has  been ordered. The staples and the incision should stay in for approximately 2-3 weeks. These can be taken out by myself or by the facility that will be doing the rehabilitation.  LOS: 6 days    Zelma Mazariego A 08/12/2015

## 2015-08-12 NOTE — Progress Notes (Signed)
Physical Therapy Treatment Patient Details Name: Maurice [name] MRN: [id number] DOB: [birth year] Today's Date: 08/12/2015    History of Present Illness Pt is a 39 year old male who was admitted with osteomyelitis of the right foot.  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  He states that his foot had multiple wounds and he has had very limited gait for the past year while trying to heal the foot.  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  He is, in fact, on a transplant list for a kidney.  He lives with his wife and normally is able to dress and bathe himself.    PT Comments    Pt tolerating treatment session with significant c/o of pain, and moments of tearful expression of frustration, motivated to complete entire PT sesssion as planned, but feeling as though he is self limited. Pt continues to make progress toward goals as evidenced by ability to perform sit to stand transfer and remain standing for 60 seconds. Pt's greatest limitation continues to be pain which continues to limit ability to perform mobility training, therex, and ideal positioning of surgical limb. at baseline function. Patient presenting with impairment of strength, pain, range of motion, balance, and activity tolerance, limiting ability to perform ADL and mobility tasks at  baseline level of function. Patient will benefit from skilled intervention to address the above impairments and limitations, in order to restore to prior level of function, improve patient safety upon discharge, and to decrease caregiver burden.    Follow Up Recommendations  CIR     Equipment Recommendations  None recommended by PT    Recommendations for Other Services       Precautions / Restrictions Precautions Precautions: Fall Restrictions Weight Bearing Restrictions: No    Mobility  Bed  Mobility Overal bed mobility: Needs Assistance Bed Mobility: Supine to Sit     Supine to sit: HOB elevated;Mod assist     General bed mobility comments: Pt received in chair.   Transfers Overall transfer level: Needs assistance Equipment used: Rolling walker (2 wheeled) Transfers: Sit to/from Stand Sit to Stand: Mod assist         General transfer comment: knee block and mod lift assist to bring hips forward; patient has limited control of hip extension, decreaseing stability during transisitonal movements. Able to scoot forward modI c increased pain.   Ambulation/Gait             General Gait Details: unable   Stairs            Wheelchair Mobility    Modified Rankin (Stroke Patients Only)       Balance Overall balance assessment: Modified Independent                                  Cognition Arousal/Alertness: Awake/alert Behavior During Therapy: WFL for tasks assessed/performed (Tearful, frustrated, moderately motivated. ) Overall Cognitive Status: Within Functional Limits for tasks assessed                      Exercises General Exercises - Lower Extremity Long Arc Quad: AROM;Left;15 reps;Seated Hip ABduction/ADduction: Right;AROM;10 reps;Seated Hip Flexion/Marching: AROM;Left;Seated;Other reps (comment) (2x15) Toe Raises: AROM;Left;15 reps;Seated Other Exercises Other Exercises: Seated trunk rotation 10x bilat Other Exercises: scapular retraction 1x15,  tactile cues for correct movement.     General Comments        Pertinent Vitals/Pain Pain Assessment: 0-10 Pain Score: 10-Worst pain ever Pain Location: RLE, notably where the posterior portion of suture is pressing against the chair.  Pain Descriptors / Indicators: Aching;Throbbing;Cramping Pain Intervention(s): Limited activity within patient's tolerance;Monitored during session;Premedicated before session;Patient requesting pain meds-RN notified;RN gave pain meds  during session;Relaxation;Repositioned    Home Living Family/patient expects to be discharged to:: Inpatient rehab Living Arrangements: Spouse/significant other                  Prior Function Level of Independence: Independent          PT Goals (current goals can now be found in the care plan section) Acute Rehab PT Goals Patient Stated Goal: Eventual mobility for prosthetic use on RLE.  PT Goal Formulation: With patient/family Time For Goal Achievement: 08/25/15 Potential to Achieve Goals: Good Progress towards PT goals: Progressing toward goals    Frequency  Min 6X/week    PT Plan Current plan remains appropriate    Co-evaluation             End of Session Equipment Utilized During Treatment: Gait belt Activity Tolerance: Patient tolerated treatment well;Patient limited by pain Patient left: with call bell/phone within reach;in chair;with family/visitor present;with nursing/sitter in room     Time: 1037-1120 PT Time Calculation (min) (ACUTE ONLY): 43 min  Charges:  $Therapeutic Exercise: 23-37 mins $Self Care/Home Management: 8-22                    G Codes:      Akari Defelice C 09-06-15, 11:33 AM  11:35 AM  Rosamaria Lints, PT, DPT [place] License # [id number]

## 2015-08-12 NOTE — Plan of Care (Signed)
Problem: Acute Rehab OT Goals (only OT should resolve) Goal: Pt. Will Perform Grooming In chair

## 2015-08-12 NOTE — Progress Notes (Signed)
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 Other issues included anemia requiring transufsion 5 units PRBC and AKI.  Assessment/Plan: 1. Osteomyelitis right foot seen on MRI. 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 WBC trending down. 2. 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 Renal function now near baseline.  3. Hypocalcemia. Replete. 4. 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 Stable. 5. Anemia of CKD, iron deficiency, stable Hgb.  6. Hyponatremia, thought hypovolemia, slowly improving.   Overall improving.  Will transition to oral pain medication.  Change to oral abx in AM.   Replace potassium   Likely rehab in AM.  Code Status: Full DVT prophylaxis: SCDs Family discussion: Dicussed plan in detail. No further concerns at this time. Disposition Plan: Discharge when stable  Brendia Sacks, MD  [facility]  Pager [phone] If 7PM-7AM, please contact night-coverage at [email or web address], password [facility] 08/12/2015, 10:40 AM  LOS: 6 days   Consultants:  General surgery  Podiatry  Nephrology   PT: CIR in [place]  Procedures:  Transfused 5 unit prbc  Right above-the-knee amputation  Antibiotics:  Clindamycin 9/7>>  Aztreonam 9/9>>  HPI/Subjective: Tolerating food okay. No nausea or vomiting. Pain at surgical site.  Objective: Filed Vitals:   08/11/15 1630 08/11/15 2103 08/12/15 0604 08/12/15 0900  BP: 141/75 155/89 155/85 167/87  Pulse: 74 92 91 94  Temp: 98.5 F (36.9 C) 98 F (36.7 C) 97.9 F (36.6 C) 97.8 F (36.6 C)  TempSrc: Oral Oral Oral Oral  Resp: 16 18 20 20   Height:      Weight:      SpO2: 100% 100% 100% 100%    Intake/Output  Summary (Last 24 hours) at 08/12/15 1040 Last data filed at 08/12/15 0800  Gross per 24 hour  Intake 2718.75 ml  Output   1950 ml  Net 768.75 ml     Filed Weights   08/05/15 2254 08/06/15 0314  Weight: 112.492 kg (248 lb) 102.558 kg (226 lb 1.6 oz)    Exam: General: Appears calm and comfortable Cardiovascular: RRR, no m/r/g. No LE edema.  Respiratory: CTA bilaterally, no w/r/r. Normal respiratory effort. Abdomen: soft, ntnd Skin: Incision appears to be healing well Psychiatric: grossly normal mood and affect, speech fluent and appropriate Neurologic: grossly non-focal.  New data reviewed:  CBG stable  Creatinine slowly improving near baseline  CO2 16, w/o significant change  Cacium 6.3  CBC: WBC trending down 15.4. Hgb 8.2.  Pertinent data since admission:  BC NG  MRI right foot IMPRESSION: 1. Soft tissue wound along the dorsal aspect of the midfoot and lateral aspect of the midfoot with extensive soft tissue air. There is cortical irregularity with corresponding T1 hypointensity involving the cuboid, lateral cuneiform and lateral base of the fourth metatarsal most concerning for osteomyelitis. 2. Cortical irregularity with marrow edema involving the medial and middle cuneiforms also concerning for osteomyelitis. 3. Nondisplaced fractures of the bases of the first, second and third metatarsals with severe marrow edema with adjacent soft tissue wound and air. 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  Pending data:  CBC  BMP  Scheduled Meds: . sodium chloride   Intravenous Once  . sodium chloride  Intravenous Once  . ALPRAZolam  0.5 mg Oral TID  . aztreonam  1 g Intravenous Q8H  . calcium acetate  667 mg Oral TID WC  . clindamycin (CLEOCIN) IV  600 mg Intravenous Q8H  . furosemide  40 mg Intravenous Daily  . insulin aspart  0-20 Units Subcutaneous TID WC  . insulin  aspart  0-5 Units Subcutaneous QHS  . insulin glargine  42 Units Subcutaneous QHS  . iron polysaccharides  150 mg Oral BID  . metoprolol  50 mg Oral BID  . omega-3 acid ethyl esters  1,000 mg Oral BID  . pantoprazole  40 mg Oral Daily  . Vilazodone HCl  20 mg Oral Daily   Continuous Infusions: . sodium chloride 75 mL/hr at 08/12/15 1478    Principal Problem:   Osteomyelitis of right foot Active Problems:   Diabetic foot infection   Acute renal failure superimposed on stage 4 chronic kidney disease   Anemia, chronic disease   Diabetes mellitus with complication   Essential hypertension   Hyponatremia   Diabetic ulcer of right foot   Leukocytosis   Time spent 25 minutes  By signing my name below, I, Edman Circle attest that this documentation has been prepared under the direction and in the presence of Dr. Brendia Sacks, M.D.   Electronically signed: Edman Circle  08/12/2015    I have reviewed the above documentation for accuracy and completeness, and I agree with the above. Brendia Sacks, MD

## 2015-08-12 NOTE — Progress Notes (Signed)
LEW PROUT  MRN: [id number]  DOB/AGE: Oct 18, 1976 39 y.o.  Primary Care Physician:PROVIDER NOT IN SYSTEM  Admit date: 08/05/2015  Chief Complaint:  Chief Complaint  Patient presents with  . Foot Ulcer    S-Pt presented on  08/05/2015 with  Chief Complaint  Patient presents with  . Foot Ulcer  .    Pt main concern is still pain at the amputation site.    Pt says he is managing it.   Meds . sodium chloride   Intravenous Once  . sodium chloride   Intravenous Once  . ALPRAZolam  0.5 mg Oral TID  . aztreonam  1 g Intravenous Q8H  . calcium acetate  667 mg Oral TID WC  . clindamycin (CLEOCIN) IV  600 mg Intravenous Q8H  . furosemide  40 mg Intravenous Daily  . insulin aspart  0-20 Units Subcutaneous TID WC  . insulin aspart  0-5 Units Subcutaneous QHS  . insulin glargine  42 Units Subcutaneous QHS  . iron polysaccharides  150 mg Oral BID  . metoprolol  50 mg Oral BID  . omega-3 acid ethyl esters  1,000 mg Oral BID  . pantoprazole  40 mg Oral Daily  . Vilazodone HCl  20 mg Oral Daily      Physical Exam: Vital signs in last 24 hours: Temp:  [97.7 F (36.5 C)-98 F (36.7 C)] 97.7 F (36.5 C) (09/14 1522) Pulse Rate:  [88-94] 88 (09/14 1522) Resp:  [18-20] 18 (09/14 1522) BP: (143-167)/(85-96) 143/96 mmHg (09/14 1522) SpO2:  [100 %] 100 % (09/14 1522) Weight change:  Last BM Date: 08/11/15  Intake/Output from previous day: 09/13 0701 - 09/14 0700 In: 2528.8 [P.O.:480; I.V.:1848.8; IV Piggyback:200] Out: 1950 [Urine:1950] Total I/O In: -  Out: 1000 [Urine:1000]   Physical Exam: General- pt is awake,alert, oriented to time place and person Resp- No acute REsp distress, CTA B/L NO Rhonchi CVS- S1S2 regular in rate and rhythm GIT- BS+, soft, NT, ND EXT- NO LE Edema, NO Cyanosis          Right AKA+ Staples in situ  Lab Results: CBC  Recent Labs  08/11/15 0641 08/12/15 0655  WBC 18.5* 15.4*  HGB 8.5* 8.2*  HCT 24.6* 23.5*  PLT 407* 446*     BMET  Recent Labs  08/11/15 0641 08/12/15 0655  NA 131* 132*  K 3.5 3.4*  CL 103 104  CO2 18* 16*  GLUCOSE 105* 101*  BUN 56* 53*  CREATININE 4.56* 4.21*  CALCIUM 6.6* 6.3*   Creat trend 2016  5.97=>4.83=>4.56=>4.21           6.2=>4.5 ( Earlier admission)  2014   1.72  MICRO Recent Results (from the past 240 hour(s))  Blood culture (routine x 2)     Status: None   Collection Time: 08/06/15 12:50 AM  Result Value Ref Range Status   Specimen Description BLOOD RIGHT ANTECUBITAL  Final   Special Requests   Final    BOTTLES DRAWN AEROBIC AND ANAEROBIC AEB=8CC ANA=6CC   Culture NO GROWTH 5 DAYS  Final   Report Status 08/11/2015 FINAL  Final  Blood culture (routine x 2)     Status: None   Collection Time: 08/06/15  1:03 AM  Result Value Ref Range Status   Specimen Description BLOOD LEFT ANTECUBITAL  Final   Special Requests   Final    BOTTLES DRAWN AEROBIC AND ANAEROBIC AEB=8CC ANA=10CC   Culture NO GROWTH 5 DAYS  Final   Report Status  08/11/2015 FINAL  Final  Surgical pcr screen     Status: None   Collection Time: 08/09/15 11:20 AM  Result Value Ref Range Status   MRSA, PCR NEGATIVE NEGATIVE Final   Staphylococcus aureus NEGATIVE NEGATIVE Final    Comment:        The Xpert SA Assay (FDA approved for NASAL specimens in patients over 1 years of age), is one component of a comprehensive surveillance program.  Test performance has been validated by [facility] for patients greater than or equal to 37 year old. It is not intended to diagnose infection nor to guide or monitor treatment.       Lab Results  Component Value Date   CALCIUM 6.3* 08/12/2015   PHOS 5.5* 08/10/2015   Albumin 1.6 Corrected Calcium 6.3+1.2=7.5    Impression: 1)Renal  AKI secondary to  ATN                AKi improving                AKI on CKD               CKD stage 4.               CKD since 2014 ( Most likley before that)               CKD secondary to DM                 Progression of CKD marked with multiple AKI                 2)HTN  Medication- On Diuretics On Beta blockers   3)Anemia HGb stable Iron deficincy anemia  4)CKD Mineral-Bone Disorder  Phosphorus at goal.     On BInders Calcium low when correcetd for low albumin     On calcium based binders   5)ID-admitted with osteomyelitis S/p amputtaion Primary MD following  6)Electrolytes  Hypokalemic  Hyponatremic    7)Acid base Co2 at goal     Plan:  Will replte kcl  will start calcium carbonate Will follow bmet    Kayda Allers S 08/12/2015, 8:30 PM

## 2015-08-12 NOTE — Evaluation (Addendum)
Occupational Therapy Evaluation Patient Details Name: Maurice [name] MRN: [id number] DOB: Oct 13, 1976 Today's Date: 08/12/2015    History of Present Illness Pt is a 39 year old male who was admitted with osteomyelitis of the right foot.  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  He states that his foot had multiple wounds and he has had very limited gait for the past year while trying to heal the foot.  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  He is, in fact, on a transplant list for a kidney.  He lives with his wife and normally is able to dress and bathe himself.    Clinical Impression    Pt was seen for evaluation, pt awake, alert, and oriented. Pt reports 8/10 pain this am.  Pt reports he has been unable to lay in supine with hip extended except for very short periods of time due to cramping pain, he declined to attempt to lay in supine during evalutation. Pt continues to be depressed over his situation, as he is worried about the impact on his children.  Pt was able to sit at EOB with mod assist to pull from supine to sitting with HOB elevated. Pt demonstrates BUE range of motion WNL and strength 4+/5. Pt reports neuropathy in bilateral hands and reports difficulty manipulating buttons at baseline. Pt was independent in ADL tasks prior to amputation, currently will require skilled intervention to learn dressing techniques, functional transfers, functional mobility tasks, and instruction in use of DME and AE, as well as instruction in safety during ADL completion. Pt reports debating between CIR and HH services, discussed each with pt and discussed caregiver support at home. Strongly recommending Inpatient Rehab for pt at time of discharge. Pt is agreeable and prefers [place] facility.     Follow Up Recommendations  CIR ([place], [place] CIR facility )     Equipment Recommendations  None recommended by OT       Precautions / Restrictions Precautions Precautions: Fall Restrictions Weight Bearing Restrictions: No      Mobility Bed Mobility Overal bed mobility: Needs Assistance Bed Mobility: Supine to Sit     Supine to sit: HOB elevated;Mod assist     General bed mobility comments: Pt required mod assist to pull forward to sitting at EOB         ADL Overall ADL's : Needs assistance/impaired       ADL  Overall ADL's  Needs assistance/impaired  Eating/Feeding Modified independent;Sitting  Grooming Set up;Sitting (unable to complete in standing )  Upper Body Bathing Modified independent;Sitting  Lower Body Bathing Moderate assistance;Cueing for compensatory techniques;Cueing for sequencing;Sitting/lateral leans  Upper Body Dressing  Minimal assistance;Sitting (requires assistance for buttons due to neuropathy )  Lower Body Dressing Moderate assistance;Cueing for sequencing;Cueing for safety;Cueing for compensatory techniques;Bed level  Toilet Transfer Maximal assistance;BSC  Toileting- Clothing Manipulation and Hygiene Maximal assistance;Cueing for sequencing;Cueing for compensatory techniques  Tub/ Shower Transfer Maximal assistance;Cueing for safety;Cueing for sequencing;Transfer board;Tub bench;Rolling walker (Pt currently unable to complete due to pain)  Functional mobility during ADLs Moderate assistance;Cueing for safety;Wheelchair  General ADL Comments Pt will require skilled intervention in dressing and bathing techniques, transfers, use of DME and AE, and functional mobility tasks.        Vision Vision Assessment?: No apparent visual deficits  Pertinent Vitals/Pain Pain Assessment: 0-10 Pain Score: 8  Pain Location: RLE Pain Descriptors / Indicators: Aching;Throbbing;Cramping Pain Intervention(s): Limited activity within patient's tolerance;Monitored during session     Hand Dominance Right    Extremity/Trunk Assessment Upper Extremity Assessment Upper Extremity Assessment: RUE deficits/detail;LUE deficits/detail RUE Deficits / Details: history of peripheral neuropathy RUE Coordination: decreased fine motor LUE Coordination: decreased fine motor   Lower Extremity Assessment Lower Extremity Assessment: RLE deficits/detail RLE Deficits / Details: new right AKA...pt is able to lie supine with HOB elevated to 30 degrees but is unable to lie flat with hip in full extension RLE Sensation: history of peripheral neuropathy LLE Sensation: history of peripheral neuropathy;decreased light touch       Communication Communication Communication: No difficulties   Cognition Arousal/Alertness: Awake/alert Behavior During Therapy: Flat affect Overall Cognitive Status: Within Functional Limits for tasks assessed                                Home Living Family/patient expects to be discharged to:: Inpatient rehab Living Arrangements: Spouse/significant other                                      Prior Functioning/Environment Level of Independence: Independent             OT Diagnosis: Acute pain   OT Problem List: Pain    End of Session    Activity Tolerance: Patient limited by pain Patient left: in bed;with call bell/phone within reach;with family/visitor present   Time: 0818-0900 OT Time Calculation (min): 42 min Charges:  OT General Charges $OT Visit: 1 Procedure OT Evaluation $Initial OT Evaluation Tier I: 1 Procedure  Ezra Sites, OTR/L  [phone]  08/12/2015, 9:35 AM

## 2015-08-12 NOTE — Progress Notes (Signed)
1610 Lab called to report critical lab value Ca+ 6.3, MD notified.

## 2015-08-12 NOTE — Progress Notes (Signed)
ANTIBIOTIC CONSULT NOTE - FOLLOW UP  Pharmacy Consult for Aztreonam and Clindamycin Indication: Osteomyelitis / S/P AKA  Allergies  Allergen Reactions  . Daptomycin     Hypotension   . Bactrim [Sulfamethoxazole-Trimethoprim] Other (See Comments)    Cannot take due to kidney issue  . Dapsone Nausea And Vomiting  . Latex Other (See Comments)    Blisters.   . Penicillins Hives  . Vancomycin Itching   Patient Measurements: Height: 6\' 3"  (190.5 cm) Weight: 226 lb 1.6 oz (102.558 kg) IBW/kg (Calculated) : 84.5  Vital Signs: Temp: 97.9 F (36.6 C) (09/14 0604) Temp Source: Oral (09/14 0604) BP: 155/85 mmHg (09/14 0604) Pulse Rate: 91 (09/14 0604) Intake/Output from previous day: 09/13 0701 - 09/14 0700 In: 2528.8 [P.O.:480; I.V.:1848.8; IV Piggyback:200] Out: 1950 [Urine:1950] Intake/Output from this shift: Total I/O In: 240 [P.O.:240] Out: -   Labs:  Recent Labs  08/10/15 0659  08/10/15 1733 08/11/15 0641 08/12/15 0655  WBC 24.4*  --   --  18.5* 15.4*  HGB 9.7*  < > 9.5* 8.5* 8.2*  PLT 391  --   --  407* 446*  CREATININE 4.83*  --   --  4.56* 4.21*  < > = values in this interval not displayed. Estimated Creatinine Clearance: 30.3 mL/min (by C-G formula based on Cr of 4.21). No results for input(s): VANCOTROUGH, VANCOPEAK, VANCORANDOM, GENTTROUGH, GENTPEAK, GENTRANDOM, TOBRATROUGH, TOBRAPEAK, TOBRARND, AMIKACINPEAK, AMIKACINTROU, AMIKACIN in the last 72 hours.   Microbiology: Recent Results (from the past 720 hour(s))  Blood culture (routine x 2)     Status: None   Collection Time: 08/06/15 12:50 AM  Result Value Ref Range Status   Specimen Description BLOOD RIGHT ANTECUBITAL  Final   Special Requests   Final    BOTTLES DRAWN AEROBIC AND ANAEROBIC AEB=8CC ANA=6CC   Culture NO GROWTH 5 DAYS  Final   Report Status 08/11/2015 FINAL  Final  Blood culture (routine x 2)     Status: None   Collection Time: 08/06/15  1:03 AM  Result Value Ref Range Status   Specimen Description BLOOD LEFT ANTECUBITAL  Final   Special Requests   Final    BOTTLES DRAWN AEROBIC AND ANAEROBIC AEB=8CC ANA=10CC   Culture NO GROWTH 5 DAYS  Final   Report Status 08/11/2015 FINAL  Final  Surgical pcr screen     Status: None   Collection Time: 08/09/15 11:20 AM  Result Value Ref Range Status   MRSA, PCR NEGATIVE NEGATIVE Final   Staphylococcus aureus NEGATIVE NEGATIVE Final    Comment:        The Xpert SA Assay (FDA approved for NASAL specimens in patients over 88 years of age), is one component of a comprehensive surveillance program.  Test performance has been validated by [facility] for patients greater than or equal to 79 year old. It is not intended to diagnose infection nor to guide or monitor treatment.     Anti-infectives    Start     Dose/Rate Route Frequency Ordered Stop   08/07/15 0900  aztreonam (AZACTAM) 1 g in dextrose 5 % 50 mL IVPB     1 g 100 mL/hr over 30 Minutes Intravenous Every 8 hours 08/07/15 0740     08/06/15 1000  clindamycin (CLEOCIN) IVPB 600 mg     600 mg 100 mL/hr over 30 Minutes Intravenous Every 8 hours 08/06/15 0922     08/06/15 0045  clindamycin (CLEOCIN) IVPB 600 mg     600 mg 100 mL/hr  over 30 Minutes Intravenous  Once 08/06/15 0036 08/06/15 0202     Assessment: 39 yo male s/p right leg AKA.  Pt on Clindamycin and Aztreonam post op.  SCr elevated, no renal adjustment needed for current ABX Rx.  Cultures (-)  Goal of Therapy:  Eradicate infection.   Plan:  Continue Aztreonam 1gm IV every 8 hours. Continue Clindamycin  IV q8h Duration of therapy per MD, transition to PO abx when appropriate  Valrie Hart A 08/12/2015,8:59 AM

## 2015-08-12 NOTE — Progress Notes (Signed)
1515 Patient attempting to ambulate with assistance from bed to chair lost balance and was lowered to the floor by [facility] nursing student and [facility] nursing instructor. Patient was lowered gently to the floor on his buttocks, no injury noted. Patient did not hit his head. ROM to LEFT leg WNL with no injury noted. Staples to incision site to RIGHT AKA intact with no bleeding or open areas noted. Patient denied any new pain. VS stable. MD notified and made aware. Family and staff asked to sit with patient.

## 2015-08-13 DIAGNOSIS — D638 Anemia in other chronic diseases classified elsewhere: Secondary | ICD-10-CM

## 2015-08-13 LAB — PHOSPHORUS: Phosphorus: 5.2 mg/dL — ABNORMAL HIGH (ref 2.5–4.6)

## 2015-08-13 LAB — GLUCOSE, CAPILLARY
GLUCOSE-CAPILLARY: 62 mg/dL — AB (ref 65–99)
GLUCOSE-CAPILLARY: 81 mg/dL (ref 65–99)
GLUCOSE-CAPILLARY: 103 mg/dL — AB (ref 65–99)
GLUCOSE-CAPILLARY: 100 mg/dL — AB (ref 65–99)
Glucose-Capillary: 133 mg/dL — ABNORMAL HIGH (ref 65–99)

## 2015-08-13 LAB — BASIC METABOLIC PANEL
ANION GAP: 7 (ref 5–15)
BUN: 48 mg/dL — ABNORMAL HIGH (ref 6–20)
CO2: 18 mmol/L — AB (ref 22–32)
Calcium: 6.7 mg/dL — ABNORMAL LOW (ref 8.9–10.3)
Chloride: 111 mmol/L (ref 101–111)
Creatinine, Ser: 3.76 mg/dL — ABNORMAL HIGH (ref 0.61–1.24)
GFR calc Af Amer: 22 mL/min — ABNORMAL LOW (ref >60)
GFR calc non Af Amer: 19 mL/min — ABNORMAL LOW (ref >60)
GLUCOSE: 70 mg/dL (ref 65–99)
POTASSIUM: 3.5 mmol/L (ref 3.5–5.1)
Sodium: 136 mmol/L (ref 135–145)

## 2015-08-13 LAB — MAGNESIUM: Magnesium: 1.8 mg/dL (ref 1.7–2.4)

## 2015-08-13 MED ORDER — SODIUM BICARBONATE 650 MG PO TABS
650.0000 mg | ORAL_TABLET | Freq: Two times a day (BID) | ORAL | Status: DC
  Administered 2015-08-13 – 2015-08-14 (×3): 650 mg via ORAL
  Filled 2015-08-13 (×3): qty 1

## 2015-08-13 MED ORDER — FUROSEMIDE 40 MG PO TABS
40.0000 mg | ORAL_TABLET | Freq: Every day | ORAL | Status: DC
  Filled 2015-08-13 (×2): qty 1

## 2015-08-13 NOTE — Progress Notes (Signed)
1204 After several attempts , no IV access obtained at this time. Unable to administer IV antibiotics and lasix at this time d/t no IV access. MD notified.

## 2015-08-13 NOTE — Progress Notes (Signed)
PROGRESS NOTE  Maurice [name]:[id number] DOB: [birth year] DOA: 08/05/2015 PCP: PROVIDER NOT IN SYSTEM  Pain Management: Pierre Bali, MD [facility] Pain Medicine [phone]  Summary: 1 yom wiith a history of Uncontrolled DM-1, Stage IV CKD, and Osteomyelitis of the Right Foot presented with complaints of worsening of his foot wound, fever and chills.He completed oral Levaquin therapy 2 days prior to arrival. He was advised to go to the ED by his podiatrist Dr. [name] in [place].He was evaluated in the ED and placed on IV Clindamycin and referred for admission.  Assessment/Plan: 1. Osteomyelitis right foot seen on MRI. 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 No fever. 2. 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 Renal function now near baseline.  3. 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 One low this AM, but now stable. 4. Anemia of CKD, iron deficiency, stable Hgb.  5. Hyponatremia, thought hypovolemia, resolved.   Overall improving. Continue fluids and antibiotics are stable.  Discharge to rehab tomorrow.  Code Status: Full DVT prophylaxis: SCDs Family discussion: Son's mother bedside. Discussed with patient who understands and has no concerns at this time. Disposition Plan: Discharge to rehab 9/16.   Brendia Sacks, MD  [facility]  Pager [phone] If 7PM-7AM, please contact night-coverage at [email or web address], password [facility] 08/13/2015, 6:28 AM  LOS: 7 days   Consultants:  General surgery  Podiatry  Nephrology   PT: CIR in [place]  Procedures:  Transfused 5 unit prbc  Right above-the-knee amputation  Antibiotics:  Clindamycin 9/7>>  Aztreonam 9/9>>  HPI/Subjective: Slightly sore. Slide to the floor yesterday but no reports of injuries. Complains of cramps  Objective: Filed Vitals:   08/12/15 1522 08/12/15 2104 08/13/15 0212 08/13/15 0548  BP: 143/96 148/81 138/82 152/83  Pulse: 88 93 72 79  Temp: 97.7 F (36.5 C)  97.5 F (36.4 C) 98.3 F (36.8 C) 97.7 F (36.5 C)  TempSrc: Oral Oral Oral Oral  Resp: Height:      Weight:      SpO2: 100% 100% 100% 100%    Intake/Output Summary (Last 24 hours) at 08/13/15 0628 Last data filed at 08/13/15 6578  Gross per 24 hour  Intake    720 ml  Output   1000 ml  Net   -280 ml     Filed Weights   08/05/15 2254 08/06/15 0314  Weight: 112.492 kg (248 lb) 102.558 kg (226 lb 1.6 oz)    Exam: Afebrile,not hypoxic  General:  Appears calm and comfortable Cardiovascular: RRR, no m/r/g. No LE edema. Respiratory: CTA bilaterally, no w/r/r. Normal respiratory effort. Musculoskeletal: grossly normal tone BUE/BLE. Right AKA  Psychiatric: grossly normal mood and affect, speech fluent and appropriate  New data reviewed:  CBG stable  BUN and creatinine continues to improve 48/3.76  Magnesium normal   Pertinent data since admission:  BC NG  MRI right foot IMPRESSION: 1. Soft tissue wound along the dorsal aspect of the midfoot and lateral aspect of the midfoot with extensive soft tissue air. There is cortical irregularity with corresponding T1 hypointensity involving the cuboid, lateral cuneiform and lateral base of the fourth metatarsal most concerning for osteomyelitis. 2. Cortical irregularity with marrow edema involving the medial and middle cuneiforms also concerning for osteomyelitis. 3. Nondisplaced fractures of the bases of the first, second and third metatarsals with severe marrow edema with adjacent soft tissue wound and air. The overall appearance is most concerning for a severely progressive  Charcot foot likely with superimposed osteomyelitis.  Pending data:  Scheduled Meds: . sodium chloride   Intravenous Once  . sodium chloride   Intravenous Once  . ALPRAZolam  0.5 mg Oral TID  . aztreonam  1 g Intravenous Q8H  . calcium acetate  667 mg Oral TID WC  . calcium carbonate  1 tablet Oral Daily  . clindamycin (CLEOCIN) IV   600 mg Intravenous Q8H  . furosemide  40 mg Intravenous Daily  . insulin aspart  0-20 Units Subcutaneous TID WC  . insulin aspart  0-5 Units Subcutaneous QHS  . insulin glargine  42 Units Subcutaneous QHS  . iron polysaccharides  150 mg Oral BID  . metoprolol  50 mg Oral BID  . omega-3 acid ethyl esters  1,000 mg Oral BID  . pantoprazole  40 mg Oral Daily  . Vilazodone HCl  20 mg Oral Daily   Continuous Infusions: . sodium chloride 75 mL/hr at 08/12/15 1610    Principal Problem:   Osteomyelitis of right foot Active Problems:   Diabetic foot infection   Acute renal failure superimposed on stage 4 chronic kidney disease   Anemia, chronic disease   Diabetes mellitus with complication   Essential hypertension   Hyponatremia   Diabetic ulcer of right foot   Leukocytosis   Time spent 20 minutes  By signing my name below, I, Zadie Cleverly attest that this documentation has been prepared under the direction and in the presence of Brendia Sacks, MD Electronically signed: Zadie Cleverly  08/13/2015   I have reviewed the above documentation for accuracy and completeness, and I agree with the above. Brendia Sacks, MD

## 2015-08-13 NOTE — Progress Notes (Signed)
Inpatient Diabetes Program Recommendations  AACE/ADA: New Consensus Statement on Inpatient Glycemic Control (2015)  Target Ranges:  Prepandial:   less than 140 mg/dL      Peak postprandial:   less than 180 mg/dL (1-2 hours)      Critically ill patients:  140 - 180 mg/dL   Review of Glycemic Control  Current orders for Inpatient glycemic control: Lantus 42 units QHS, Novolog 0-20 units TID with meals  Inpatient Diabetes Program Recommendations: Insulin - Basal: Fasting glucose 62 mg/dl this morning. May want to consider decreasing Lantus to 40 units QHS.  Thanks, Orlando Penner, RN, MSN, CCRN, CDE Diabetes Coordinator Inpatient Diabetes Program [phone] (Team Pager from 8am to 5pm) [phone] ([facility] office) [phone] [facility] office) [phone] [facility] office)

## 2015-08-13 NOTE — Care Management Note (Signed)
Case Management Note  Patient Details  Name: JUBAL RADEMAKER MRN: [id number] Date of Birth: 17-Dec-1975  Subjective/Objective:                    Action/Plan:   Expected Discharge Date:  08/10/15               Expected Discharge Plan:  IP Rehab Facility  In-House Referral:  NA  Discharge planning Services  CM Consult  Post Acute Care Choice:  Resumption of Svcs/PTA Provider Choice offered to:  Patient  DME Arranged:    DME Agency:     HH Arranged:    HH Agency:     Status of Service:  In process, will continue to follow  Medicare Important Message Given:  Yes-second notification given Date Medicare IM Given:    Medicare IM give by:    Date Additional Medicare IM Given:    Additional Medicare Important Message give by:     If discussed at Long Length of Stay Meetings, dates discussed:08/13/15    Additional Comments: Anticipate discharge to [facility] on 08/14/15. Pt still receiving iV AB and IVF at this time with nephrology following renal function.  Arlyss Queen [place], RN 08/13/2015, 12:05 PM

## 2015-08-13 NOTE — Progress Notes (Signed)
PT Cancellation Note  Patient Details Name: Maurice [name] MRN: [id number] DOB: [birth year]   Cancelled Treatment:    Reason Eval/Treat Not Completed: Fatigue/lethargy limiting ability to participate.  Pt states that he has had very little sleep since yesterday and has just now been able to relax enough to get some rest.  He requests that we defer PT to a bit later.   Myrlene Broker L 08/13/2015, 12:57 PM

## 2015-08-13 NOTE — Discharge Instructions (Signed)
Once staples are removed, call Judy Pimple at [facility] in [place].  [phone] for compression stocking on right amputation site.

## 2015-08-13 NOTE — Progress Notes (Addendum)
Subjective: Patient denies any nausea or vomiting. He states that his blood sugar was low this morning but feels much better  ns in last 24 hours: Temp:  [97.5 F (36.4 C)-98.3 F (36.8 C)] 97.7 F (36.5 C) (09/15 0548) Pulse Rate:  [72-94] 79 (09/15 0548) Resp:  [18-20] 18 (09/15 0548) BP: (138-167)/(81-96) 152/83 mmHg (09/15 0548) SpO2:  [100 %] 100 % (09/15 0548)  Intake/Output from previous day: 09/14 0701 - 09/15 0700 In: 720 [P.O.:720] Out: 1000 [Urine:1000] Intake/Output this shift:     Recent Labs  08/10/15 1540 08/10/15 1733 08/11/15 0641 08/12/15 0655  HGB 9.8* 9.5* 8.5* 8.2*    Recent Labs  08/11/15 0641 08/12/15 0655  WBC 18.5* 15.4*  RBC 3.04* 2.90*  HCT 24.6* 23.5*  PLT 407* 446*    Recent Labs  08/12/15 0655 08/13/15 0634  NA 132* 136  K 3.4* 3.5  CL 104 111  CO2 16* 18*  BUN 53* 48*  CREATININE 4.21* 3.76*  GLUCOSE 101* 70  CALCIUM 6.3* 6.7*   No results for input(s): LABPT, INR in the last 72 hours.  Generally patient is alert and in no apparent distress Chest is clear to auscultation Heart exam revealed regular rate and rhythm no murmur Extremities no edema  Assessment/Plan: Problem #1 acute kidney injury superimposed on chronic. His renal function progressively improving and he is none liguric Problem #2 chronic renal failure: Stage IV Problem #3 hyponatremia: His sodium has corrected Problem #4 anemia: His status post blood transfusion. His hemoglobin is low and declining Problem #5 diabetes Problem #6 hypertension: His blood pressure is reasonably controlled Problem #7 metabolic bone disease: His calcium is range but his phosphorus is high and on a binder Problem #8 osteomyelitis s/p right AKA Problem#9 Metabolic acidosis his CO2 remains low . On sodium bicarboante Plan: 1]We'll continue with present treatement 2]]We'll check his basic metabolic panel  and phosphorus in the morning. 3] We will start patient on sodium  bicarbonate 650 mg po bid   Maurice [name] S 08/13/2015, 8:44 AM

## 2015-08-13 NOTE — Clinical Social Work Placement (Signed)
   CLINICAL SOCIAL WORK PLACEMENT  NOTE  Date:  08/13/2015  Patient Details  Name: Maurice [name] MRN: [id number] Date of Birth: 1975/12/24  Clinical Social Work is seeking post-discharge placement for this patient at the Skilled  Nursing Facility level of care (*CSW will initial, date and re-position this form in  chart as items are completed):  No (Pt only wants [place] facilities)   Patient/family provided with [employer] Clinical Social Work Department's list of facilities offering this level of care within the geographic area requested by the patient (or if unable, by the patient's family).  Yes   Patient/family informed of their freedom to choose among providers that offer the needed level of care, that participate in Medicare, Medicaid or managed care program needed by the patient, have an available bed and are willing to accept the patient.  Yes   Patient/family informed of [facility]'s ownership interest in [facility] and [facility], as well as of the fact that they are under no obligation to receive care at these facilities.  PASRR submitted to EDS on       PASRR number received on       Existing PASRR number confirmed on       FL2 transmitted to all facilities in geographic area requested by pt/family on 08/13/15     FL2 transmitted to all facilities within larger geographic area on       Patient informed that his/her managed care company has contracts with or will negotiate with certain facilities, including the following:            Patient/family informed of bed offers received.  Patient chooses bed at       Physician recommends and patient chooses bed at      Patient to be transferred to   on  .  Patient to be transferred to facility by       Patient family notified on   of transfer.  Name of family member notified:        PHYSICIAN       Additional Comment:  [place] not needed per [place] facilities.    _______________________________________________ Karn Cassis, LCSW 08/13/2015, 10:58 AM [phone]

## 2015-08-13 NOTE — Progress Notes (Signed)
Hypoglycemic Event  CBG: 62  Treatment: 15 GM carbohydrate snack  Symptoms: None  Follow-up CBG: Time: 0904 CBG Result: 81  Possible Reasons for Event: Inadequate meal intake  Comments/MD notified: Yes    Maurice [name] L  Remember to initiate Hypoglycemia Order Set & complete

## 2015-08-13 NOTE — Progress Notes (Signed)
Stump sock in place. Dry. Awaiting placement.

## 2015-08-13 NOTE — Clinical Social Work Note (Signed)
Clinical Social Work Assessment  Patient Details  Name: Maurice [name] MRN: [id number] Date of Birth: [birth year]  Date of referral:  08/13/15               Reason for consult:  Facility Placement                Permission sought to share information with:    Permission granted to share information::     Name::        Agency::     Relationship::     Contact Information:     Housing/Transportation Living arrangements for the past 2 months:  Single Family Home Source of Information:  Patient Patient Interpreter Needed:  None Criminal Activity/Legal Involvement Pertinent to Current Situation/Hospitalization:  No - Comment as needed Significant Relationships:  Spouse Lives with:  Spouse, Minor Children Do you feel safe going back to the place where you live?  No Need for family participation in patient care:  Yes (Comment)  Care giving concerns:  Pt would be alone some during the day.    Social Worker assessment / plan:  CSW met with pt at bedside. Pt alert and oriented and reports he lives with his wife and three young children, ages 61, 7, and 53. Pt came to ED due to worsening foot ulcers. He had right AKA several days ago. Pt reports he has been on disability since 2006 and was a Runner, broadcasting/film/video prior to that. Pt has had difficulty with pain control, but reports this is improving. He is hoping to go to [place] inpatient rehab at d/c. CSW discussed backup plan and he agrees to SNF (only at [facility] or [facility]). CSW will send referral. He is frustrated that he does not have an answer yet about inpatient rehab. CSW reassured him that this was still going to be the plan, but if for some reason it fell through, then either home with home health or SNF would need to be set up. He appeared to understand.   Employment status:  Disabled (Comment on whether or not currently receiving Disability) Insurance information:  Medicare PT Recommendations:  [place], Inpatient Rehab  Consult Information / Referral to community resources:  Other (Comment Required) (pt requests [facility] or [facility] as backup to [employer])  Patient/Family's Response to care:  Pt hopeful that he can go to [place] inpatient rehab, but is open to SNF as backup plan.   Patient/Family's Understanding of and Emotional Response to Diagnosis, Current Treatment, and Prognosis:  Pt reports "Not very well" when asked how he is coping at this point with amputation. CSW attempted to discuss further with pt, but he states, "I don't want to get into it." He refused to discuss anything outside of d/c plan. CSW offered to meet with pt at another time if he wanted to talk further.   Emotional Assessment Appearance:  Appears stated age Attitude/Demeanor/Rapport:  Guarded Affect (typically observed):  Overwhelmed, Grieving Orientation:  Oriented to Self, Oriented to Place, Oriented to  Time, Oriented to Situation Alcohol / Substance use:  Not Applicable Psych involvement (Current and /or in the community):  No (Comment)  Discharge Needs  Concerns to be addressed:  Discharge Planning Concerns Readmission within the last 30 days:  No Current discharge risk:  Physical Impairment Barriers to Discharge:  Continued Medical Work up   [employer], [employer], [place] 08/13/2015, 11:01 AM [phone]

## 2015-08-13 NOTE — Progress Notes (Signed)
Podiatry Progress Note  Subjective:  Maurice [name] is a 39 y.o. male being followed for osteomyelitis of the right foot.  Dr. Lovell Sheehan is following this patient.  An above knee amputation was performed Monday.   Past Medical History  Diagnosis Date  . PVC (premature ventricular contraction)   . PAC (premature atrial contraction)   . Diabetes mellitus without complication   . Anxiety   . Depression   . Blood clot in vein     pt sts" it may have been in artery and not vein" of right leg  . GERD (gastroesophageal reflux disease)   . H/O hiatal hernia   . Barrett's esophagus   . Arthritis   . Neuropathy   . Herniated disc, cervical     c5 and c6 and c6 and c7  . Hypertension   . Kidney failure     stage 4 just dx 4 months ago.Sees Dr Elby Beck, nephrologist in [place] [phone].  .  . 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 Diabetic retinopathy of both eyes, associated with type 2 diabetes mellitus   . Osteomyelitis of right foot 06/17/2015    S/p debridement, Dr. [name]  . Diabetic foot infection 06/16/2015    S/p debridement and wound VAC  . Sepsis    Scheduled Meds: . sodium chloride   Intravenous Once  . sodium chloride   Intravenous Once  . ALPRAZolam  0.5 mg Oral TID  . aztreonam  1 g Intravenous Q8H  . calcium acetate  667 mg Oral TID WC  . calcium carbonate  1 tablet Oral Daily  . clindamycin (CLEOCIN) IV  600 mg Intravenous Q8H  . furosemide  40 mg Intravenous Daily  . insulin aspart  0-20 Units Subcutaneous TID WC  . insulin aspart  0-5 Units Subcutaneous QHS  . insulin glargine  42 Units Subcutaneous QHS  . iron polysaccharides  150 mg Oral BID  . metoprolol  50 mg Oral BID  . omega-3 acid ethyl esters  1,000 mg Oral BID  . pantoprazole  40 mg Oral Daily  . Vilazodone HCl  20 mg Oral Daily   Continuous Infusions: . sodium chloride 75 mL/hr at 08/12/15 0814   PRN Meds:.acetaminophen **OR** acetaminophen, alum & mag  hydroxide-simeth, cyclobenzaprine, oxyCODONE  Allergies  Allergen Reactions  . Daptomycin     Hypotension   . Bactrim [Sulfamethoxazole-Trimethoprim] Other (See Comments)    Cannot take due to kidney issue  . Dapsone Nausea And Vomiting  . Latex Other (See Comments)    Blisters.   . Penicillins Hives  . Vancomycin Itching   Past Surgical History  Procedure Laterality Date  . Wrist arthroscopy      left with no break reapir of tendond and ligamants  . Toe amputation      5th digit right foot-[place]  . Debridement leg      lower leg-left  . Metatarsal head excision Right 02/14/2013    Procedure: 4TH METATARSAL HEAD RESECTION RIGHT FOOT;  Surgeon: Dallas Schimke, DPM;  Location: [facility];  Service: Orthopedics;  Laterality: Right;  latex allergy  . Vitrectomy Right   . Application of wound vac Right 06/19/2015    Procedure: APPLICATION OF WOUND VAC;  Surgeon: [name], DPM;  Location: [facility];  Service: Podiatry;  Laterality: Right;  . Irrigation and debridement foot Right 06/19/2015    Procedure: IRRIGATION AND DEBRIDEMENT FOOT ;  Surgeon: [name],  DPM;  Location: [facility];  Service: Podiatry;  Laterality: Right;  . Wound debridement Right 07/10/2015    Procedure: DEBRIDEMENT WOUND SUBCUTANEOUS TISSUE, MUSCLE, BONE RIGHT FOOT;  Surgeon: [name], DPM;  Location: [facility];  Service: Podiatry;  Laterality: Right;  . Application of wound vac Right 07/10/2015    Procedure: APPLICATION OF WOUND VAC;  Surgeon: [name], DPM;  Location: [facility];  Service: Podiatry;  Laterality: Right;  . Amputation Right 08/10/2015    Procedure: RIGHT LEG ABOVE KNEE AMPUTATION;  Surgeon: Franky Macho, MD;  Location: [facility];  Service: General;  Laterality: Right;   Family History  Problem Relation Age of Onset  . Cancer Mother     liver lung  . Cancer Father     liver  . Diabetes Mother   . Diabetes Father    Social History:  reports that he quit smoking about 8  weeks ago. His smoking use included Cigarettes. He has a 5 pack-year smoking history. His smokeless tobacco use includes Chew. He reports that he drinks alcohol. He reports that he does not use illicit drugs.  Physical Examination: Vital signs in last 24 hours:   Temp:  [97.5 F (36.4 C)-98.3 F (36.8 C)] 97.7 F (36.5 C) (09/15 0548) Pulse Rate:  [72-94] 79 (09/15 0548) Resp:  [18-20] 18 (09/15 0548) BP: (138-167)/(81-96) 152/83 mmHg (09/15 0548) SpO2:  [100 %] 100 % (09/15 0548)  GENERAL:  Alert. INTEGUMENT: There is a hyperkeratotic lesion present along the medial plantar aspect of the left great toe.  Upon debridement, no ulceration was encountered.   VASCULAR:  DP is palpable.  PT is palpable. MUSCULOSKELETAL:  An above knee amputation of the right lower extremity has been performed.  Dressing is intact.  Lab/Test Results:    Recent Labs  08/11/15 0641 08/12/15 0655 08/13/15 0634  WBC 18.5* 15.4*  --   HGB 8.5* 8.2*  --   HCT 24.6* 23.5*  --   PLT 407* 446*  --   NA 131* 132* 136  K 3.5 3.4* 3.5  CL 103 104 111  CO2 18* 16* 18*  BUN 56* 53* 48*  CREATININE 4.56* 4.21* 3.76*  GLUCOSE 105* 101* 70  CALCIUM 6.6* 6.3* 6.7*    Recent Results (from the past 240 hour(s))  Blood culture (routine x 2)     Status: None   Collection Time: 08/06/15 12:50 AM  Result Value Ref Range Status   Specimen Description BLOOD RIGHT ANTECUBITAL  Final   Special Requests   Final    BOTTLES DRAWN AEROBIC AND ANAEROBIC AEB=8CC ANA=6CC   Culture NO GROWTH 5 DAYS  Final   Report Status 08/11/2015 FINAL  Final  Blood culture (routine x 2)     Status: None   Collection Time: 08/06/15  1:03 AM  Result Value Ref Range Status   Specimen Description BLOOD LEFT ANTECUBITAL  Final   Special Requests   Final    BOTTLES DRAWN AEROBIC AND ANAEROBIC AEB=8CC ANA=10CC   Culture NO GROWTH 5 DAYS  Final   Report Status 08/11/2015 FINAL  Final  Surgical pcr screen     Status: None   Collection Time:  08/09/15 11:20 AM  Result Value Ref Range Status   MRSA, PCR NEGATIVE NEGATIVE Final   Staphylococcus aureus NEGATIVE NEGATIVE Final    Comment:        The Xpert SA Assay (FDA approved for NASAL specimens in patients over 3 years of age), is one component of  a comprehensive surveillance program.  Test performance has been validated by [facility] for patients greater than or equal to 75 year old. It is not intended to diagnose infection nor to guide or monitor treatment.      No results found.  Assessment: 1.  Acquired keratoderma, left foot. 2.  S/P left AKA. 3.  Diabetes mellitus with peripheral neuropathy.  Plan: The hyperkeratotic lesion of the left foot was debrided without incident.  The patient is to follow-up for at risk foot care.   Maurice [name] 08/13/2015, 8:28 AM

## 2015-08-14 LAB — BASIC METABOLIC PANEL
ANION GAP: 7 (ref 5–15)
BUN: 41 mg/dL — ABNORMAL HIGH (ref 6–20)
CALCIUM: 7 mg/dL — AB (ref 8.9–10.3)
CO2: 19 mmol/L — ABNORMAL LOW (ref 22–32)
Chloride: 111 mmol/L (ref 101–111)
Creatinine, Ser: 3.62 mg/dL — ABNORMAL HIGH (ref 0.61–1.24)
GFR, EST AFRICAN AMERICAN: 23 mL/min — AB (ref >60)
GFR, EST NON AFRICAN AMERICAN: 20 mL/min — AB (ref >60)
GLUCOSE: 87 mg/dL (ref 65–99)
POTASSIUM: 3.5 mmol/L (ref 3.5–5.1)
SODIUM: 137 mmol/L (ref 135–145)

## 2015-08-14 LAB — PHOSPHORUS: PHOSPHORUS: 4.6 mg/dL (ref 2.5–4.6)

## 2015-08-14 LAB — GLUCOSE, CAPILLARY
GLUCOSE-CAPILLARY: 95 mg/dL (ref 65–99)
Glucose-Capillary: 98 mg/dL (ref 65–99)

## 2015-08-14 MED ORDER — FUROSEMIDE 40 MG PO TABS: 40.0000 mg | ORAL_TABLET | Freq: Every day | ORAL | Status: AC | PRN

## 2015-08-14 MED ORDER — LEVOFLOXACIN 500 MG PO TABS: 500.0000 mg | ORAL_TABLET | ORAL | Status: DC

## 2015-08-14 MED ORDER — ACETAMINOPHEN 325 MG PO TABS: 650.0000 mg | ORAL_TABLET | Freq: Four times a day (QID) | ORAL | Status: AC | PRN

## 2015-08-14 MED ORDER — CALCIUM CARBONATE 1250 (500 CA) MG PO TABS: 1.0000 | ORAL_TABLET | Freq: Every day | ORAL | Status: AC

## 2015-08-14 MED ORDER — SODIUM BICARBONATE 650 MG PO TABS: 650.0000 mg | ORAL_TABLET | Freq: Two times a day (BID) | ORAL | Status: AC

## 2015-08-14 MED ORDER — LEVOFLOXACIN 500 MG PO TABS
500.0000 mg | ORAL_TABLET | ORAL | Status: DC
  Administered 2015-08-14: 500 mg via ORAL
  Filled 2015-08-14: qty 1

## 2015-08-14 NOTE — Progress Notes (Signed)
Subjective: Patient offers no new complaint  ns in last 24 hours: Temp:  [97.6 F (36.4 C)-98.7 F (37.1 C)] 98.7 F (37.1 C) (09/16 0555) Pulse Rate:  [72-87] 84 (09/16 0555) Resp:  [18] 18 (09/16 0555) BP: (136-166)/(74-85) 151/85 mmHg (09/16 0555) SpO2:  [100 %] 100 % (09/16 0555)  Intake/Output from previous day: 09/15 0701 - 09/16 0700 In: 960 [P.O.:960] Out: 900 [Urine:900] Intake/Output this shift:     Recent Labs  08/12/15 0655  HGB 8.2*    Recent Labs  08/12/15 0655  WBC 15.4*  RBC 2.90*  HCT 23.5*  PLT 446*    Recent Labs  08/13/15 0634 08/14/15 0650  NA 136 137  K 3.5 3.5  CL 111 111  CO2 18* 19*  BUN 48* 41*  CREATININE 3.76* 3.62*  GLUCOSE 70 87  CALCIUM 6.7* 7.0*   No results for input(s): LABPT, INR in the last 72 hours.  Generally patient is alert and in no apparent distress Chest is clear to auscultation Heart exam revealed regular rate and rhythm no murmur Extremities no edema  Assessment/Plan: Problem #1 acute kidney injury superimposed on chronic. His renal function progressively improving and he is none oliguric. Returning to his base line Problem #2 chronic renal failure: Stage IV Problem #3 hyponatremia: His sodium has corrected Problem #4 anemia: His status post blood transfusion. His hemoglobin is low and declining Problem #5 diabetes Problem #6 hypertension: His blood pressure is reasonably controlled Problem #7 metabolic bone disease: His calcium is and his phosphorus is in range Problem #8 osteomyelitis s/p right AKA Problem#9 Metabolic acidosis his CO2 remains low . On sodium bicarbonate. Improving Plan: 1] D/C [place] 2] Continue with sodium bicarbonate 3] Lasix 40 mg po once a day PRN difficulty in breathing or leg edema 4] Patient will be followed by his nephrologist Dr.Garcia as out patint   [facility] S 08/14/2015, 8:32 AM

## 2015-08-14 NOTE — Discharge Summary (Signed)
Physician Discharge Summary  Maurice [name] VHQ:[id number] DOB: 1976/05/01 DOA: 08/05/2015  PCP: Maurice [name]. PA-C Admit date: 08/05/2015 Discharge date: 08/14/2015  Recommendations for Outpatient Follow-up:   Per general surgery, staples are to stay in place for 2-3 weeks.   Follow up with PCP in 1-2 weeks.   Follow up with podiatry, [name], DPM, as outpatient.   Follow up with nephrologist as outpatient.   Continue taking Levaquin as prescribed until 9/20.   Follow-up Information    Schedule an appointment as soon as possible for a visit with Maurice Stallion, MD.   Specialty:  Internal Medicine   Why:  after discharge from CIR      Follow up with Maurice Munro, NP.   Specialty:  Family Medicine   Why:  after discharge from [facility]   Contact information:   [address] [place] [address] [phone]       Discharge Diagnoses:  1. Osteomyelitis right foot. 2. 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  3. Hypocalcemia.  4. 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  5. Anemia of CKD, iron deficiency. 6. Hyponatremia, thought hypovolemia.   Discharge Condition: Improved Disposition: Discharged to rehab  Diet recommendation: Regular   Filed Weights   08/05/15 2254 08/06/15 0314  Weight: 112.492 kg (248 lb) 102.558 kg (226 lb 1.6 oz)    History of present illness:  39 y.o.m with history of uncontrolled DM-1, Stage IV CKD, and Osteomyelitis of the Right Foot who presented to the ED with complaints of worsening of his foot wounds, fever and chills.He was advised to go to the ED by his podiatrist Dr. Nolen [name] in [place]. He was evaluated in the ED and placed on IV Clindamycin and referred for admission.   Hospital Course: Osteomyelitis right foot which was seen on MRI was treated with broad-spectrum abx upon initial admission. After further evaluation general surgery and podiatry agreed that he would  require an above knee amputation. Surgery was performed 9/12 without complication. Prior to surgery he was transfused 4 units PRBC for anemia of chronic disease and given iron supplements. 1 unit PRBC was transfused during the procedure. Nephrology closely followed his AKI and treated with IVF and IV lasix and adjusted as needed. Renal function continues to improve over baselinePT and OT evaluated and recommended CIR upon discharge. All other issues remained stable.   Individual issues as below:   1. Osteomyelitis right foot seen on MRI. 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 Remains afebrile. Continues to improve.  2. 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 Renal function continues to improve over baseline.  3. 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 Remained stable 4. Anemia of CKD, iron deficiency. Remained stable.  5. Hyponatremia, thought hypovolemia, resolved. Improved with improved renal function.  Consultants:  General surgery  Podiatry   Nephrology   PT: CIR in [place]  Procedures:  Transfused 5 unit prbc  Right above-the-knee amputation  Antibiotics:  Clindamycin 9/7>>9/15  Aztreonam 9/9>>9/15  Levaquin 9/16>>9/20  Discharge Instructions     Current Discharge Medication List    START taking these medications   Details  acetaminophen (TYLENOL) 325 MG tablet Take 2 tablets (650 mg total) by mouth every 6 (six) hours as needed for mild pain (or Fever >/= 101).    calcium carbonate (OS-CAL - DOSED IN MG OF ELEMENTAL CALCIUM) 1250 (500 CA) MG tablet Take 1 tablet (500  mg of elemental calcium total) by mouth daily.    levofloxacin (LEVAQUIN) 500 MG tablet Take 1 tablet (500 mg total) by mouth every other day. Next and only dose 9/18.    sodium bicarbonate 650 MG tablet Take 1 tablet (650 mg total) by mouth 2 (two) times daily.      CONTINUE these medications which have CHANGED   Details  furosemide (LASIX) 40 MG tablet Take 1 tablet  (40 mg total) by mouth daily as needed for fluid.      CONTINUE these medications which have NOT CHANGED   Details  ALPRAZolam (XANAX) 0.5 MG tablet Take 0.5 mg by mouth 3 (three) times daily.    cyclobenzaprine (FLEXERIL) 10 MG tablet Take 10 mg by mouth 2 (two) times daily.    insulin glargine (LANTUS) 100 UNIT/ML injection Inject 30 Units into the skin at bedtime.     iron polysaccharides (NIFEREX) 150 MG capsule Take 1 capsule (150 mg total) by mouth 2 (two) times daily. Qty: 60 capsule, Refills: 1    metoprolol (LOPRESSOR) 50 MG tablet Take 50 mg by mouth 2 (two) times daily.    mometasone (NASONEX) 50 MCG/ACT nasal spray Place 2 sprays into the nose daily as needed (for allergies).     omega-3 acid ethyl esters (LOVAZA) 1 G capsule Take 1,000 mg by mouth 2 (two) times daily. Refills: 11    omeprazole (PRILOSEC) 40 MG capsule Take 40 mg by mouth daily.    oxycodone (ROXICODONE) 30 MG immediate release tablet Take 30 mg by mouth 3 (three) times daily.     Vilazodone HCl (VIIBRYD) 20 MG TABS Take 20 mg by mouth daily.      STOP taking these medications     clindamycin (CLEOCIN) 300 MG capsule      NOVOLOG FLEXPEN 100 UNIT/ML FlexPen        Allergies  Allergen Reactions  . Daptomycin     Hypotension   . Bactrim [Sulfamethoxazole-Trimethoprim] Other (See Comments)    Cannot take due to kidney issue  . Dapsone Nausea And Vomiting  . Latex Other (See Comments)    Blisters.   . Penicillins Hives  . Vancomycin Itching    The results of significant diagnostics from this hospitalization (including imaging, microbiology, ancillary and laboratory) are listed below for reference.    Significant Diagnostic Studies: Mr Foot Right Wo Contrast  08/06/2015   CLINICAL DATA:  Acute right foot pain. Multiple surgeries to the right foot.  EXAM: MRI OF THE RIGHT FOREFOOT WITHOUT CONTRAST  TECHNIQUE: Multiplanar, multisequence MR imaging was performed. No intravenous contrast was  administered.  COMPARISON:  X-ray 08/05/2015, MR foot 06/16/2015  FINDINGS: There has been interval resection of the fifth metatarsal stump. There is a soft tissue wound along the dorsal aspect of the midfoot and lateral aspect of the foot. There is extensive soft tissue air in the midfoot. There is cortical irregularity involving the cuboid and lateral cuneiform with severe T1 signal abnormality most concerning for osteomyelitis. There is cortical irregularity with marrow edema and T1 hypointensity at the lateral base of the fourth metatarsal. There is a nondisplaced fracture of the bases of the first, second and third metatarsals with severe marrow edema. There is marrow edema in the medial and middle cuneiform with cortical irregularity and corresponding T1 hypointensity. There is minimal marrow edema in the navicular without cortical destruction. There is no drainable fluid collection.  IMPRESSION: 1. Soft tissue wound along the dorsal aspect of the midfoot and  lateral aspect of the midfoot with extensive soft tissue air. There is cortical irregularity with corresponding T1 hypointensity involving the cuboid, lateral cuneiform and lateral base of the fourth metatarsal most concerning for osteomyelitis. 2. Cortical irregularity with marrow edema involving the medial and middle cuneiforms also concerning for osteomyelitis. 3. Nondisplaced fractures of the bases of the first, second and third metatarsals with severe marrow edema with adjacent soft tissue wound and air. 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 4. Mild edema in the navicular which may be reactive versus early osteomyelitis. No definite bone destruction.   Electronically Signed   By: Elige Ko   On: 08/06/2015 08:40   [place] Lower Ext Art Right Ltd  08/06/2015   CLINICAL DATA:  Preoperative evaluation for below-knee amputation common known history of distal open wounds  on the right  EXAM: UNILATERAL right LOWER EXTREMITY ARTERIAL DUPLEX SCAN  TECHNIQUE: Gray-scale sonography as well as color Doppler and duplex ultrasound was performed to evaluate the arteries of the lower extremity.  COMPARISON:  None.  FINDINGS: Common femoral artery is widely patent. The femoral bifurcation is patent as well. The superficial femoral artery and profunda femoral arteries are within normal limits with normal waveforms.  Plaque formation is noted within the popliteal artery distally with decreased flow and elevated velocities in the distal aspect of the popliteal artery. The proximal infrapopliteal vessels are only partially visualized with monophasic waveforms identified.  IMPRESSION: Plaque formation in the popliteal artery decreased peripheral flow. The superficial femoral and common femoral arteries are within normal limits.   Electronically Signed   By: Alcide Clever M.D.   On: 08/06/2015 15:05   Dg Foot Complete Right  08/06/2015   CLINICAL DATA:  Ulcers in the right foot for several months. No injury. History of diabetes. Right foot pain.  EXAM: RIGHT FOOT COMPLETE - 3+ VIEW  COMPARISON:  07/10/2015  FINDINGS: Examination is technically limited due to patient positioning. There has been previous resection of the fifth toe and metatarsal. Previous resection of the distal fourth metatarsal head. Since the previous study, there is interval development of soft tissue swelling diffusely throughout the right midfoot with superior skin ulceration and multiple soft tissue gas collections consistent with infection with gas-forming organism. There is probable bone destruction involving the distal tarsal bones and possibly involving the proximal metatarsals. Findings suggest probable osteomyelitis. Vascular calcifications.  IMPRESSION: Diffuse soft tissue swelling with subcutaneous emphysema consistent with infection with gas-forming organism. Bone destruction and sclerosis involving the distal tarsal  bones and probably the proximal metatarsal bones is suspicious for osteomyelitis and possibly joint space infection. Previous amputations.  These results were called by telephone at the time of interpretation on 08/06/2015 at 12:25 am to Dr. Effie Shy, who verbally acknowledged these results.   Electronically Signed   By: Burman Nieves M.D.   On: 08/06/2015 00:28    Microbiology: Recent Results (from the past 240 hour(s))  Blood culture (routine x 2)     Status: None   Collection Time: 08/06/15 12:50 AM  Result Value Ref Range Status   Specimen Description BLOOD RIGHT ANTECUBITAL  Final   Special Requests   Final    BOTTLES DRAWN AEROBIC AND ANAEROBIC AEB=8CC ANA=6CC   Culture NO GROWTH 5 DAYS  Final   Report Status 08/11/2015 FINAL  Final  Blood culture (routine x 2)     Status: None   Collection Time: 08/06/15  1:03 AM  Result Value Ref Range Status   Specimen Description BLOOD LEFT ANTECUBITAL  Final   Special Requests   Final    BOTTLES DRAWN AEROBIC AND ANAEROBIC AEB=8CC ANA=10CC   Culture NO GROWTH 5 DAYS  Final   Report Status 08/11/2015 FINAL  Final  Surgical pcr screen     Status: None   Collection Time: 08/09/15 11:20 AM  Result Value Ref Range Status   MRSA, PCR NEGATIVE NEGATIVE Final   Staphylococcus aureus NEGATIVE NEGATIVE Final    Comment:        The Xpert SA Assay (FDA approved for NASAL specimens in patients over 76 years of age), is one component of a comprehensive surveillance program.  Test performance has been validated by [facility] for patients greater than or equal to 29 year old. It is not intended to diagnose infection nor to guide or monitor treatment.      Labs: Basic Metabolic Panel:  Recent Labs Lab 08/08/15 0631  08/10/15 0659 08/11/15 0641 08/12/15 0655 08/13/15 0634 08/14/15 0650  NA 127*  < > 128* 131* 132* 136 137  K 4.1  < > 3.6 3.5 3.4* 3.5 3.5  CL 97*  < > 98* 103 104 111 111  CO2 20*  < > 21* 18* 16* 18* 19*  GLUCOSE 192*   < > 197* 105* 101* 70 87  BUN 63*  < > 59* 56* 53* 48* 41*  CREATININE 5.44*  < > 4.83* 4.56* 4.21* 3.76* 3.62*  CALCIUM 8.0*  < > 7.5* 6.6* 6.3* 6.7* 7.0*  MG  --   --   --   --   --  1.8  --   PHOS 5.5*  --  5.5*  --   --  5.2* 4.6  < > = values in this interval not displayed. Liver Function Tests:  Recent Labs Lab 08/12/15 0655  ALBUMIN 1.6*    CBC:  Recent Labs Lab 08/08/15 0631 08/10/15 0659 08/10/15 1540 08/10/15 1733 08/11/15 0641 08/12/15 0655  WBC 25.9* 24.4*  --   --  18.5* 15.4*  HGB 10.0* 9.7* 9.8* 9.5* 8.5* 8.2*  HCT 29.2* 28.0* 28.7* 27.9* 24.6* 23.5*  MCV 81.1 81.6  --   --  80.9 81.0  PLT 365 391  --   --  407* 446*   CBG:  Recent Labs Lab 08/13/15 1130 08/13/15 1645 08/13/15 2034 08/13/15 2301 08/14/15 0743  GLUCAP 133* 103* 100* 95 98    Principal Problem:   Osteomyelitis of right foot Active Problems:   Diabetic foot infection   Acute renal failure superimposed on stage 4 chronic kidney disease   Anemia, chronic disease   Diabetes mellitus with complication   Essential hypertension   Hyponatremia   Diabetic ulcer of right foot   Leukocytosis   Time coordinating discharge: 35 minutes   Signed:  Brendia Sacks, MD [facility] 08/14/2015, 7:09 AM    By signing my name below, I, Burnett Harry attest that this documentation has been prepared under the direction and in the presence of Brendia Sacks, MD Electronically signed: Burnett Harry  08/14/2015  I have reviewed the above documentation for accuracy and completeness, and I agree with the above. Brendia Sacks, MD

## 2015-08-14 NOTE — Care Management Note (Signed)
Case Management Note  Patient Details  Name: Maurice [name] MRN: [id number] Date of Birth: [birth year]  Subjective/Objective:                    Action/Plan:   Expected Discharge Date:  08/10/15               Expected Discharge Plan:  IP Rehab Facility  In-House Referral:  NA  Discharge planning Services  CM Consult  Post Acute Care Choice:  Resumption of Svcs/PTA Provider Choice offered to:  Patient  DME Arranged:    DME Agency:     HH Arranged:    HH Agency:     Status of Service:  Completed, signed off  Medicare Important Message Given:  Yes-third notification given Date Medicare IM Given:    Medicare IM give by:    Date Additional Medicare IM Given:    Additional Medicare Important Message give by:     If discussed at Long Length of Stay Meetings, dates discussed:    Additional Comments: Pt discharged to [facility] center today. [facility] to provide transportation to facility. Pt and pts nurse aware of discharge arrangements. Arlyss [place], RN 08/14/2015, 9:29 AM

## 2015-08-14 NOTE — Care Management Important Message (Signed)
Important Message  Patient Details  Name: XZAVIOR REINIG MRN: [id number] Date of Birth: July 01, 1976   Medicare Important Message Given:  Yes-third notification given    Cheryl Flash, RN 08/14/2015, 9:29 AM

## 2015-08-14 NOTE — Progress Notes (Signed)
PROGRESS NOTE  Maurice [name] ZOX:[id number] DOB: 1975/12/10 DOA: 08/05/2015 PCP: Freda Munro. PA-C Pain Management: Pierre Bali, MD [facility] Pain Medicine [phone]  Summary: 77 yom with a history of Uncontrolled DM-1, Stage IV CKD, and Osteomyelitis of the Right Foot presented with complaints of worsening of his foot wound, fever and chills.He completed oral Levaquin therapy 2 days prior to arrival. He was advised to go to the ED by his podiatrist Dr. [name] in [place].He was evaluated in the ED and placed on IV Clindamycin and referred for admission.  Assessment/Plan: 1. Osteomyelitis right foot seen on MRI. 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 Remains afebrile. Continues to improve.  2. 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 Renal function continues to improve over baseline.  3. 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 Remained stable 4. Anemia of CKD, iron deficiency. Remained stable.  5. Hyponatremia, thought hypovolemia, resolved.    Clinically improved.   Discharge to rehab today.  Follow up with podiatry for outpatient diabetic foot care for left foot.    Code Status: Full DVT prophylaxis: SCDs Family discussion: No family at bedside. Discussed with patient who understands and has no concerns at this time. Disposition Plan: Discharge to rehab today.  Brendia Sacks, MD  [facility]  Pager [phone] If 7PM-7AM, please contact night-coverage at [email or web address], password [facility] 08/14/2015, 8:59 AM  LOS: 8 days   Consultants:  General surgery  Podiatry   Nephrology   PT: CIR in [place]  Procedures:  Transfused 5 unit prbc  Right above-the-knee amputation  Antibiotics:  Clindamycin 9/7>>9/15  Aztreonam 9/9>>9/15  Levaquin 9/16>>9/20  HPI/Subjective: Feeling improved each day. Has an appetite.   Objective: Filed Vitals:   08/13/15 1508 08/13/15 1848 08/13/15 2249 08/14/15 0555  BP: 136/74 155/84 166/81  151/85  Pulse: 75 86 87 84  Temp: 97.6 F (36.4 C) 97.7 F (36.5 C) 98.1 F (36.7 C) 98.7 F (37.1 C)  TempSrc: Oral Oral Oral Oral  Resp: Height:      Weight:      SpO2: 100% 100% 100% 100%    Intake/Output Summary (Last 24 hours) at 08/14/15 0859 Last data filed at 08/14/15 0555  Gross per 24 hour  Intake    480 ml  Output    900 ml  Net   -420 ml     Filed Weights   08/05/15 2254 08/06/15 0314  Weight: 112.492 kg (248 lb) 102.558 kg (226 lb 1.6 oz)    Exam: Afebrile,not hypoxic, VSS  General:  Appears comfortable, calm. Cardiovascular: Regular rate and rhythm, no murmur, rub or gallop. No lower extremity edema. Respiratory: Clear to auscultation bilaterally, no wheezes, rales or rhonchi. Normal respiratory effort. Psychiatric: grossly normal mood and affect, speech fluent and appropriate  New data reviewed:  BUN and Creatinine continues to improve 41/3.62.  Calcium improving  Phosphorus WNL  Pertinent data since admission:  BC NG  MRI right foot IMPRESSION: 1. Soft tissue wound along the dorsal aspect of the midfoot and lateral aspect of the midfoot with extensive soft tissue air. There is cortical irregularity with corresponding T1 hypointensity involving the cuboid, lateral cuneiform and lateral base of the fourth metatarsal most concerning for osteomyelitis. 2. Cortical irregularity with marrow edema involving the medial and middle cuneiforms also concerning for osteomyelitis. 3. Nondisplaced fractures of the bases of the first, second and third metatarsals with severe marrow edema with adjacent soft tissue wound and  air. 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  Pending data:  Scheduled Meds: . sodium chloride   Intravenous Once  . sodium chloride   Intravenous Once  . ALPRAZolam  0.5 mg Oral TID  . calcium carbonate  1 tablet Oral Daily  .  furosemide  40 mg Oral Daily  . insulin aspart  0-20 Units Subcutaneous TID WC  . insulin aspart  0-5 Units Subcutaneous QHS  . insulin glargine  42 Units Subcutaneous QHS  . iron polysaccharides  150 mg Oral BID  . levofloxacin  500 mg Oral Q48H  . metoprolol  50 mg Oral BID  . omega-3 acid ethyl esters  1,000 mg Oral BID  . pantoprazole  40 mg Oral Daily  . sodium bicarbonate  650 mg Oral BID  . Vilazodone HCl  20 mg Oral Daily   Continuous Infusions:    Principal Problem:   Osteomyelitis of right foot Active Problems:   Diabetic foot infection   Acute renal failure superimposed on stage 4 chronic kidney disease   Anemia, chronic disease   Diabetes mellitus with complication   Essential hypertension   Hyponatremia   Diabetic ulcer of right foot   Leukocytosis    By signing my name below, I, Burnett Harry attest that this documentation has been prepared under the direction and in the presence of Brendia Sacks, MD Electronically signed: Burnett Harry 08/14/2015  I have reviewed the above documentation for accuracy and completeness, and I agree with the above. Brendia Sacks, MD

## 2015-08-14 NOTE — Progress Notes (Signed)
Patient has been accepted to inpatient rehab in [place]. Will cancel SNF search and sign off.  Reece Levy, MSW, Theresia Majors  [phone]

## 2015-08-15 LAB — TYPE AND SCREEN
ABO/RH(D): O NEG
ANTIBODY SCREEN: NEGATIVE
Unit division: 0
Unit division: 0

## 2017-06-18 IMAGING — CR DG FOOT COMPLETE 3+V*R*
1 series · 3 of 3 positions shown · non-contrast
Comparison: June 16, 2015.

CLINICAL DATA: Postoperative debridement.

EXAM:
RIGHT FOOT COMPLETE - 3+ VIEW

[Series 2: ap · 0.17mm/px · 3 of 3 slices shown]
[im 1/3]
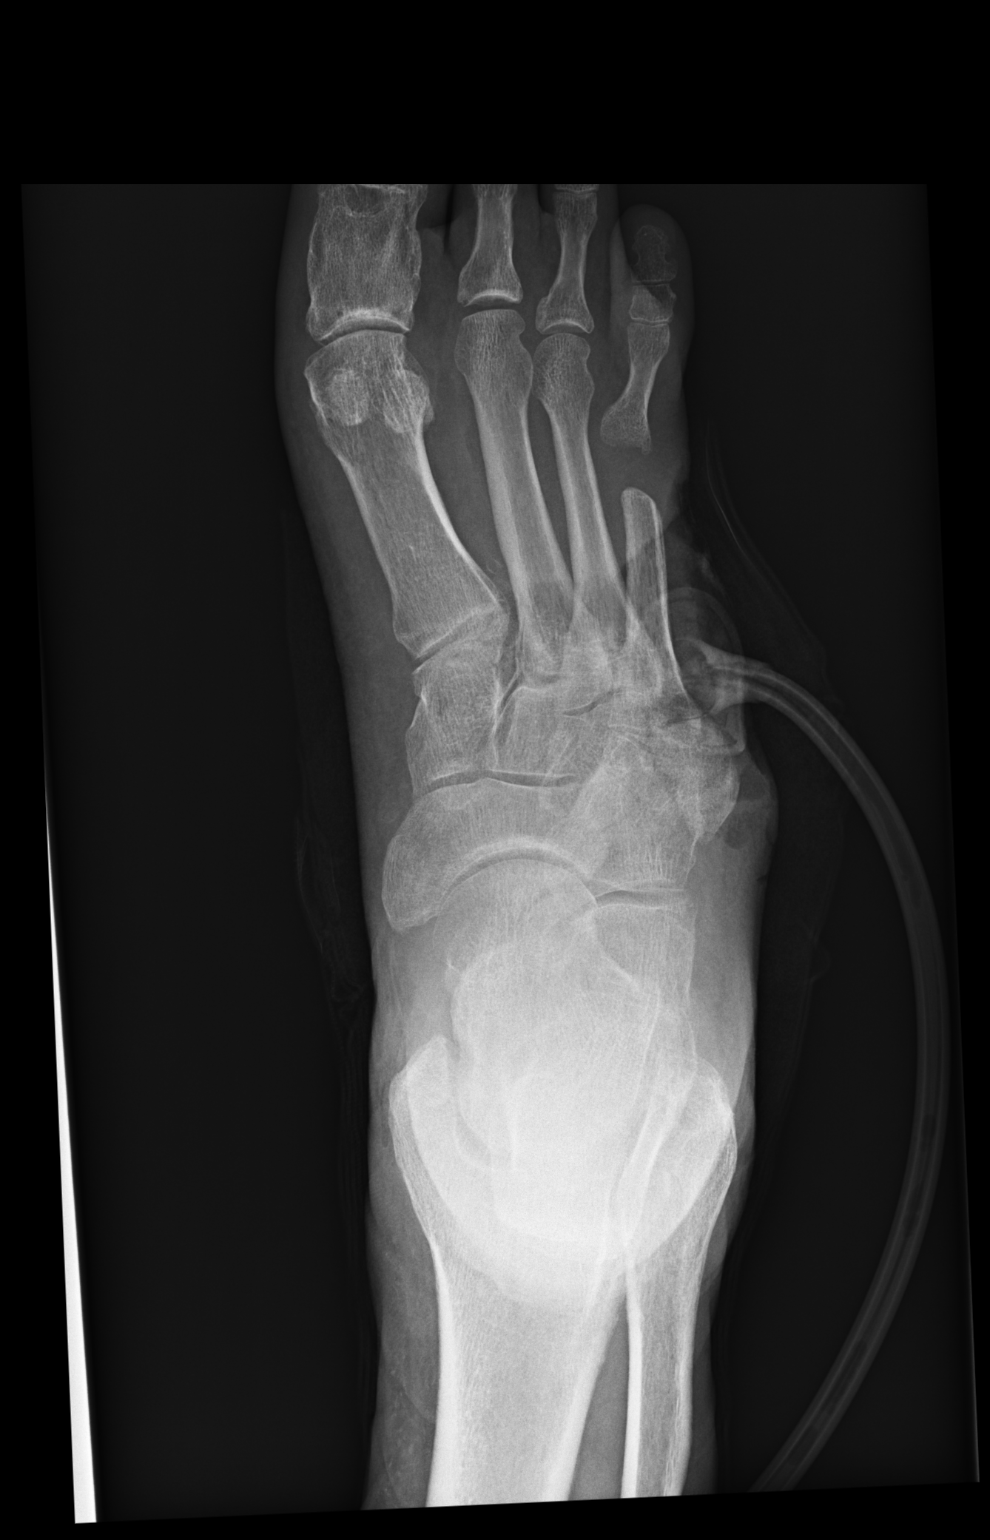
[im 2/3]
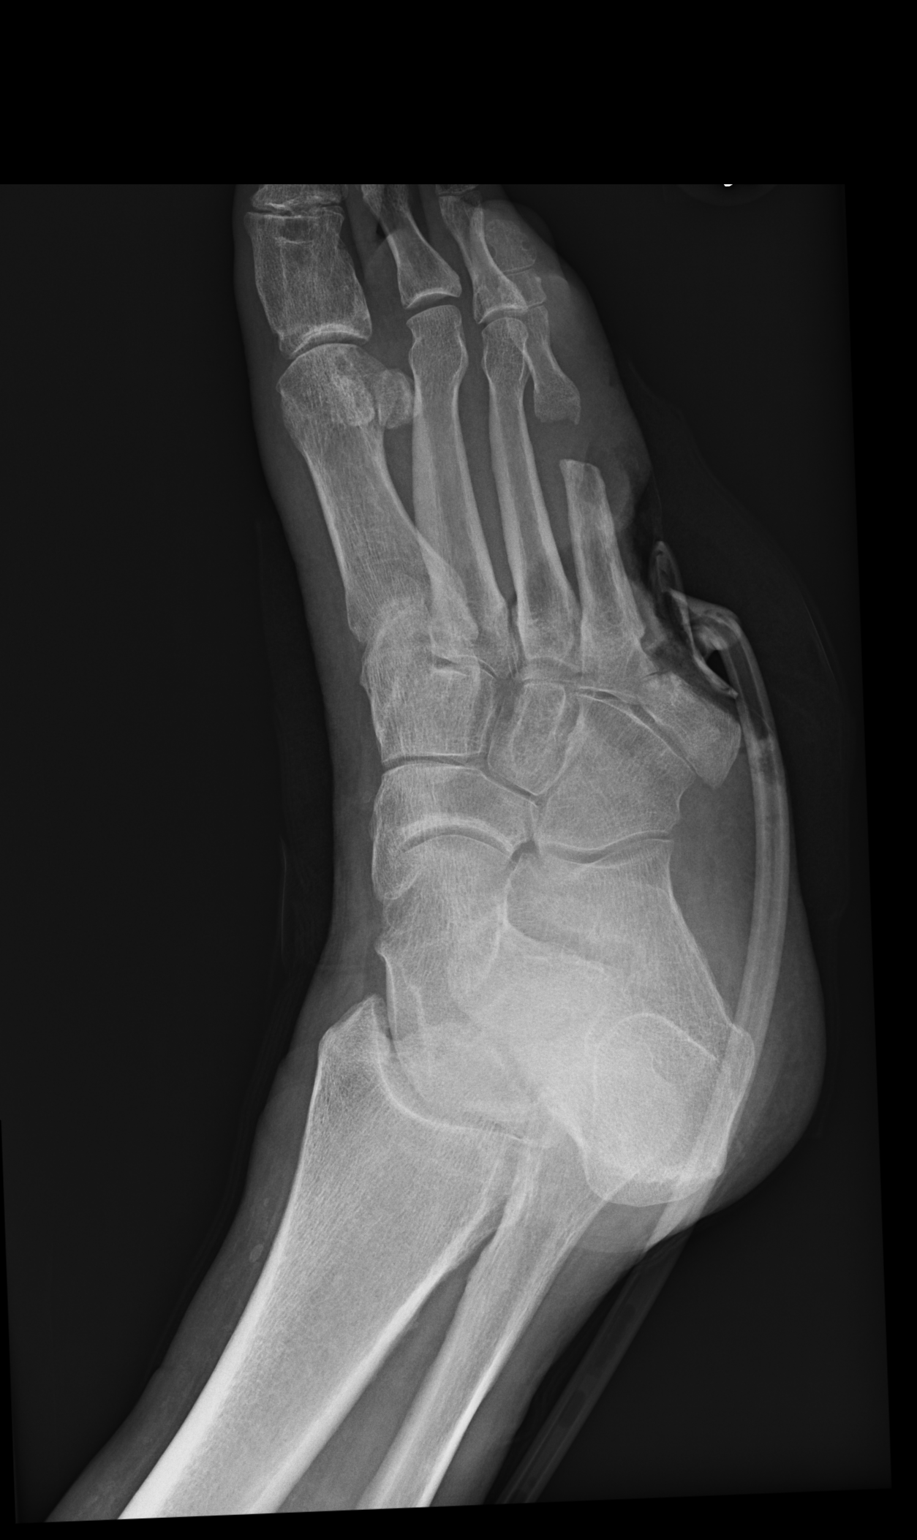
[im 3/3]
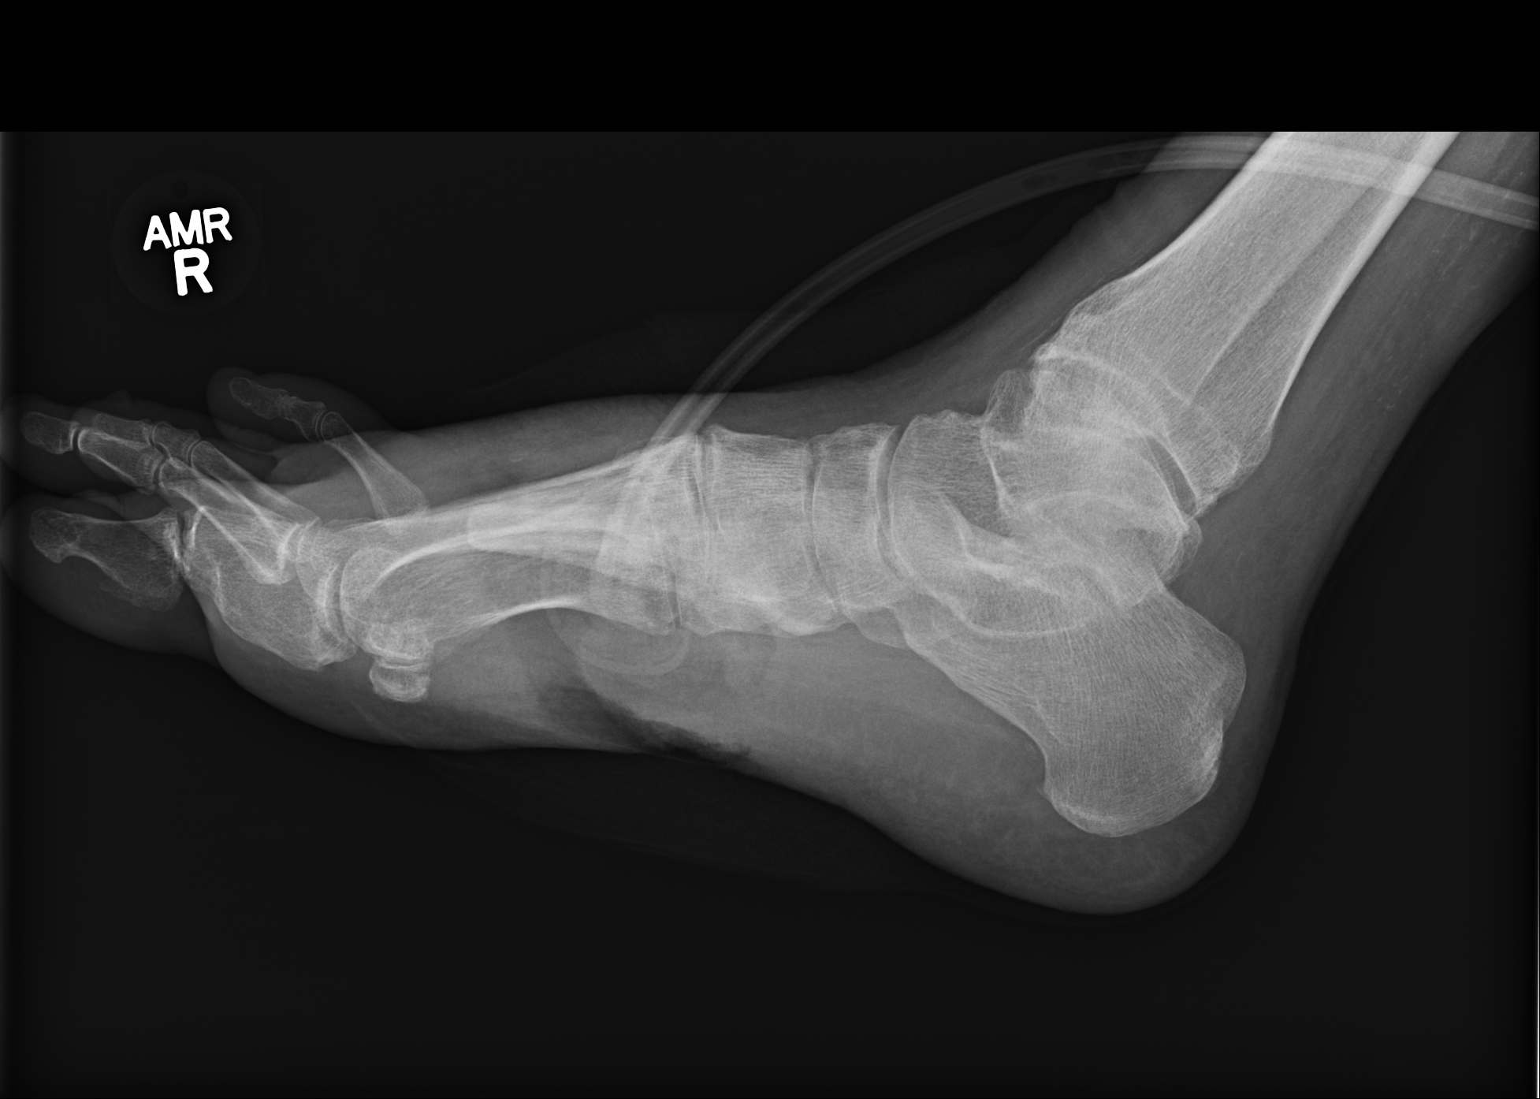

[3 of 3 positions shown; findings below may reference images not displayed]

FINDINGS: Status post old surgical resection of distal fourth metatarsal is
noted. There is been interval surgical resection of the residual
portion of the fifth metatarsal. Large soft tissue defect is seen
laterally in the right midfoot consistent with surgery. Wound VAC is
seen over this site. Degenerative changes seen involving the first
metatarsophalangeal and interphalangeal joints.
IMPRESSION: Status post surgical resection of residual portion of fifth
metatarsal with wound VAC over this site. No other areas of lytic
destruction are seen to suggest osteomyelitis.

## 2017-07-07 IMAGING — CR DG CHEST 1V PORT
1 series · 1 of 1 positions shown · non-contrast
Comparison: None.

CLINICAL DATA: Sepsis.

EXAM:
PORTABLE CHEST - 1 VIEW

[ap portable]
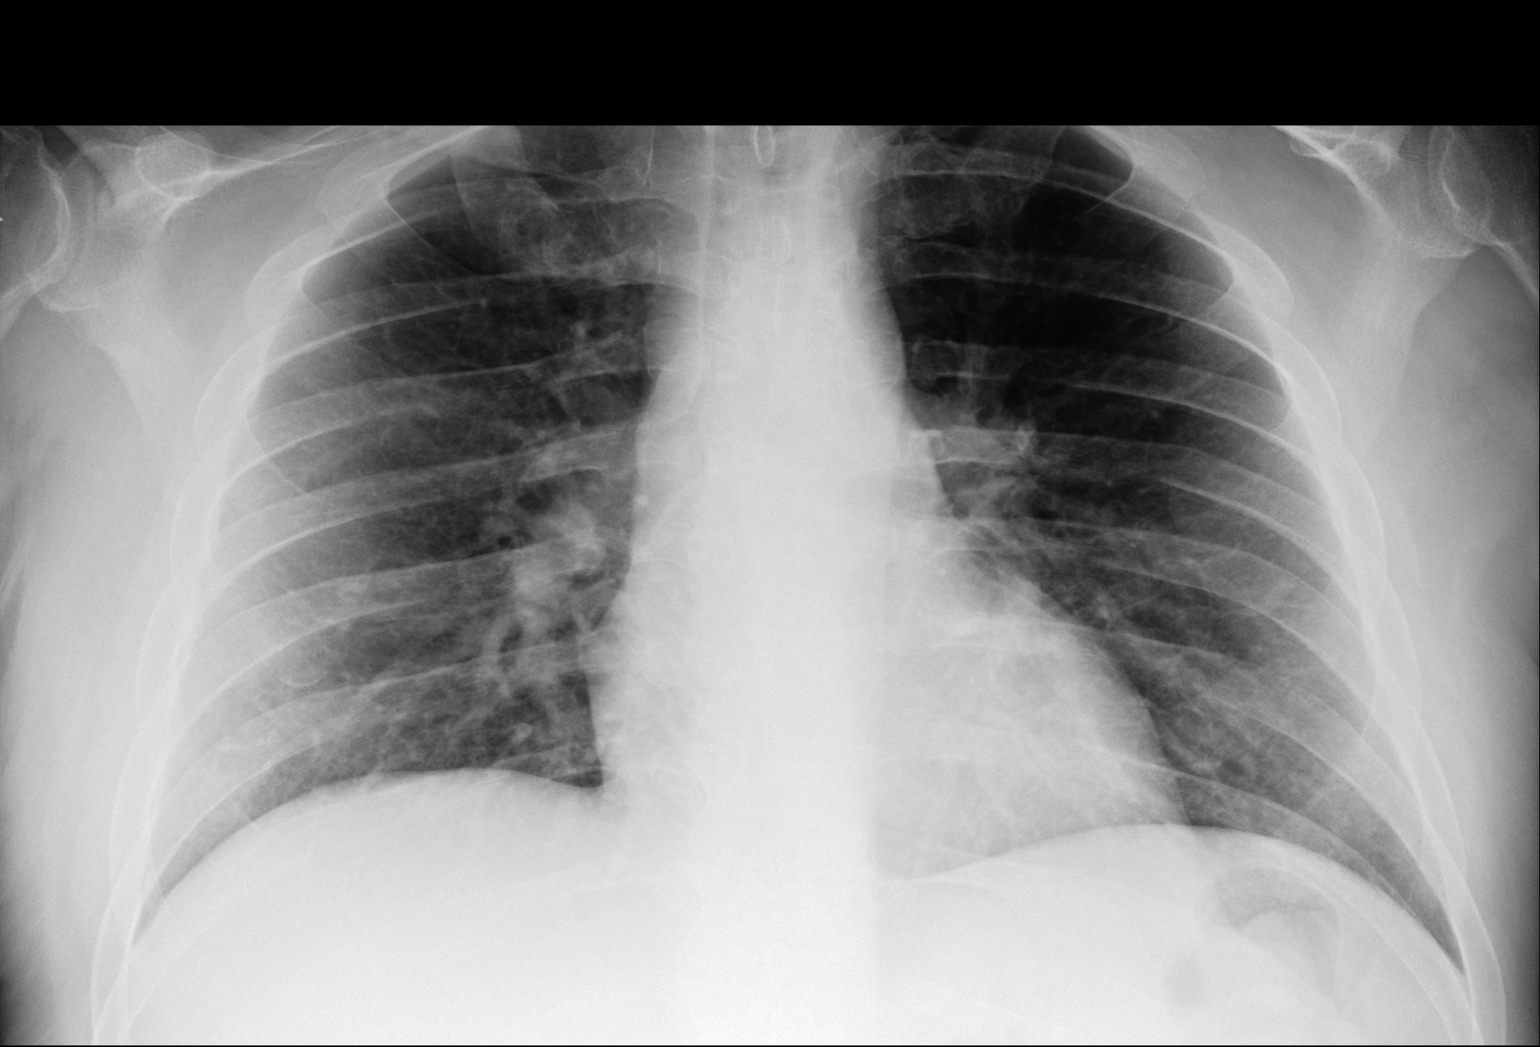

[1 of 1 positions shown; findings below may reference images not displayed]

FINDINGS: Both lungs are clear. Heart and mediastinum are within normal
limits. The trachea is midline. Negative for a pneumothorax. No
acute bone abnormality.
IMPRESSION: No acute chest abnormality.

## 2017-10-18 LAB — HEMOGLOBIN A1C: Hemoglobin A1C: 11.3

## 2017-10-18 LAB — VITAMIN D 25 HYDROXY (VIT D DEFICIENCY, FRACTURES): VIT D 25 HYDROXY: 6

## 2017-10-18 LAB — LIPID PANEL
CHOLESTEROL: 323 — AB (ref 0–200)
HDL: 28 — AB (ref 35–70)
TRIGLYCERIDES: 970 — AB (ref 40–160)

## 2017-10-18 LAB — TSH: TSH: 1.18 (ref <=5.90)

## 2017-12-16 LAB — HEMOGLOBIN A1C: HEMOGLOBIN A1C: 9.4

## 2017-12-22 ENCOUNTER — Encounter: Payer: Self-pay | Admitting: "Endocrinology

## 2017-12-22 ENCOUNTER — Ambulatory Visit (INDEPENDENT_AMBULATORY_CARE_PROVIDER_SITE_OTHER): Payer: Medicare Other | Admitting: "Endocrinology

## 2017-12-22 VITALS — BP 148/90 | HR 77

## 2017-12-22 DIAGNOSIS — I10 Essential (primary) hypertension: Secondary | ICD-10-CM | POA: Diagnosis not present

## 2017-12-22 DIAGNOSIS — E559 Vitamin D deficiency, unspecified: Secondary | ICD-10-CM | POA: Diagnosis not present

## 2017-12-22 DIAGNOSIS — N186 End stage renal disease: Secondary | ICD-10-CM

## 2017-12-22 DIAGNOSIS — E782 Mixed hyperlipidemia: Secondary | ICD-10-CM | POA: Diagnosis not present

## 2017-12-22 DIAGNOSIS — E1165 Type 2 diabetes mellitus with hyperglycemia: Secondary | ICD-10-CM

## 2017-12-22 DIAGNOSIS — E1122 Type 2 diabetes mellitus with diabetic chronic kidney disease: Secondary | ICD-10-CM | POA: Diagnosis not present

## 2017-12-22 DIAGNOSIS — IMO0002 Reserved for concepts with insufficient information to code with codable children: Secondary | ICD-10-CM

## 2017-12-22 NOTE — Progress Notes (Signed)
Consult Note       12/22/2017, 12:27 PM   Subjective:    Patient ID: Maurice [name], male    DOB: [birth year].  Maurice [name] is being seen in consultation for management of currently uncontrolled symptomatic diabetes requested by  Laural Golden, MD.   Past Medical History:  Diagnosis Date  . Anxiety   . Arthritis   . Barrett's esophagus   . Blood clot in vein    pt sts" it may have been in artery and not vein" of right leg  . Depression   . Diabetes mellitus with complication (HCC) 06/16/2015  . Diabetes mellitus without complication (HCC)   . Diabetic foot infection (HCC) 06/16/2015   S/p debridement and wound VAC  . Diabetic retinopathy of both eyes, associated with type 2 diabetes mellitus   . GERD (gastroesophageal reflux disease)   . H/O hiatal hernia   . Herniated disc, cervical    c5 and c6 and c6 and c7  . Hypertension   . Kidney failure    stage 4 just dx 4 months ago.Sees Dr Elby Beck, nephrologist in [place] [phone].  .  . Neuropathy   . Osteomyelitis of right foot (HCC) 06/17/2015   S/p debridement, Dr. [name]  . PAC (premature atrial contraction)   . PVC (premature ventricular contraction)   . Sepsis (HCC)   . Sleep apnea    pt has complex sleep apnea; waiting on CPAP   Past Surgical History:  Procedure Laterality Date  . AMPUTATION Right 08/10/2015   Procedure: RIGHT LEG ABOVE KNEE AMPUTATION;  Surgeon: Franky Macho, MD;  Location: [facility];  Service: General;  Laterality: Right;  . APPLICATION OF WOUND VAC Right 06/19/2015   Procedure: APPLICATION OF WOUND VAC;  Surgeon: [name], DPM;  Location: [facility];  Service: Podiatry;  Laterality: Right;  . APPLICATION OF WOUND VAC Right 07/10/2015   Procedure: APPLICATION OF WOUND VAC;  Surgeon: [name], DPM;  Location: [facility];  Service: Podiatry;  Laterality: Right;  . DEBRIDEMENT LEG     lower leg-left   . IRRIGATION AND DEBRIDEMENT FOOT Right 06/19/2015   Procedure: IRRIGATION AND DEBRIDEMENT FOOT ;  Surgeon: [name], DPM;  Location: [facility];  Service: Podiatry;  Laterality: Right;  . METATARSAL HEAD EXCISION Right 02/14/2013   Procedure: 4TH METATARSAL HEAD RESECTION RIGHT FOOT;  Surgeon: Dallas Schimke, DPM;  Location: [facility];  Service: Orthopedics;  Laterality: Right;  latex allergy  . TOE AMPUTATION     5th digit right foot-[place]  . VITRECTOMY Right   . WOUND DEBRIDEMENT Right 07/10/2015   Procedure: DEBRIDEMENT WOUND SUBCUTANEOUS TISSUE, MUSCLE, BONE RIGHT FOOT;  Surgeon: [name], DPM;  Location: [facility];  Service: Podiatry;  Laterality: Right;  . WRIST ARTHROSCOPY     left with no break reapir of tendond and ligamants   Social History   Socioeconomic History  . Marital status: Married    Spouse name: None  . Number of children: None  . Years of education: None  . Highest education level: None  Social Needs  . Financial resource strain: None  .  Food insecurity - worry: None  . Food insecurity - inability: None  . Transportation needs - medical: None  . Transportation needs - non-medical: None  Occupational History  . None  Tobacco Use  . Smoking status: Former Smoker    Packs/day: 0.25    Years: 20.00    Pack years: 5.00    Types: Cigarettes    Last attempt to quit: 06/16/2015    Years since quitting: 2.5  . Smokeless tobacco: Current User    Types: Chew  Substance and Sexual Activity  . Alcohol use: Yes    Alcohol/week: 0.0 oz    Comment: rarely  . Drug use: No  . Sexual activity: Yes    Birth control/protection: None  Other Topics Concern  . None  Social History Narrative  . None   Outpatient Encounter Medications as of 12/22/2017  Medication Sig  . ALPRAZolam (XANAX) 0.5 MG tablet Take 0.5 mg by mouth 3 (three) times daily.  Marland Kitchen amLODipine (NORVASC) 10 MG tablet Take 10 mg by mouth daily.  Marland Kitchen apixaban (ELIQUIS) 2.5 MG TABS tablet  Take 5 mg by mouth 2 (two) times daily.   Marland Kitchen atorvastatin (LIPITOR) 40 MG tablet Take 40 mg by mouth daily.  . calcium carbonate (OS-CAL - DOSED IN MG OF ELEMENTAL CALCIUM) 1250 (500 CA) MG tablet Take 1 tablet (500 mg of elemental calcium total) by mouth daily.  . Cholecalciferol (VITAMIN D) 2000 units CAPS Take by mouth daily.  . cyclobenzaprine (FLEXERIL) 10 MG tablet Take 10 mg by mouth 2 (two) times daily.  . fluticasone (FLONASE) 50 MCG/ACT nasal spray Place into both nostrils daily.  . furosemide (LASIX) 40 MG tablet Take 1 tablet (40 mg total) by mouth daily as needed for fluid.  . hydrALAZINE (APRESOLINE) 100 MG tablet Take 100 mg by mouth 2 (two) times daily.  . Insulin Aspart (NOVOLOG FLEXPEN [place]) Inject 10-16 Units into the skin 3 (three) times daily before meals.  . Insulin Detemir (LEVEMIR FLEXTOUCH [place]) Inject 50 Units into the skin at bedtime.  . metoprolol (LOPRESSOR) 50 MG tablet Take 50 mg by mouth 2 (two) times daily.  . mometasone (NASONEX) 50 MCG/ACT nasal spray Place 2 sprays into the nose daily as needed (for allergies).   Marland Kitchen omega-3 acid ethyl esters (LOVAZA) 1 G capsule Take 1,000 mg by mouth 2 (two) times daily.  Marland Kitchen oxycodone (ROXICODONE) 30 MG immediate release tablet Take 30 mg by mouth 3 (three) times daily.   . potassium chloride 3 mEq/L, magnesium sulfate 1 mEq/L in sodium chloride 0.9 % 3,000 mL   . ranitidine (ZANTAC) 150 MG tablet Take 150 mg by mouth 2 (two) times daily.  . Vilazodone HCl (VIIBRYD) 20 MG TABS Take 20 mg by mouth daily.  Marland Kitchen acetaminophen (TYLENOL) 325 MG tablet Take 2 tablets (650 mg total) by mouth every 6 (six) hours as needed for mild pain (or Fever >/= 101).  . iron polysaccharides (NIFEREX) 150 MG capsule Take 1 capsule (150 mg total) by mouth 2 (two) times daily.  . sodium bicarbonate 650 MG tablet Take 1 tablet (650 mg total) by mouth 2 (two) times daily.  . [DISCONTINUED] insulin glargine (LANTUS) 100 UNIT/ML injection Inject 50 Units into the  skin at bedtime.  . [DISCONTINUED] levofloxacin (LEVAQUIN) 500 MG tablet Take 1 tablet (500 mg total) by mouth every other day. Next and only dose 9/18.  . [DISCONTINUED] omeprazole (PRILOSEC) 40 MG capsule Take 40 mg by mouth daily.   No facility-administered  encounter medications on file as of 12/22/2017.     ALLERGIES: Allergies  Allergen Reactions  . Daptomycin     Hypotension   . Bactrim [Sulfamethoxazole-Trimethoprim] Other (See Comments)    Cannot take due to kidney issue  . Dapsone Nausea And Vomiting  . Latex Other (See Comments)    Blisters.   . Penicillins Hives  . Vancomycin Itching    VACCINATION STATUS: Immunization History  Administered Date(s) Administered  . Influenza,inj,Quad PF,6+ Mos 08/09/2015    Diabetes  He presents for his initial diabetic visit. He has type 2 diabetes mellitus. Onset time: He was diagnosed at approximate age of 18 years. He reports waking up to 250 pounds at that time. His disease course has been worsening. There are no hypoglycemic associated symptoms. Pertinent negatives for hypoglycemia include no confusion, headaches, pallor or seizures. Associated symptoms include blurred vision, fatigue, foot paresthesias, foot ulcerations, polydipsia and visual change. Pertinent negatives for diabetes include no chest pain, no polyphagia, no polyuria and no weakness. There are no hypoglycemic complications. Symptoms are worsening. Diabetic complications include nephropathy, peripheral neuropathy, PVD and retinopathy. (He has end-stage renal disease currently on hemodialysis. He has advanced retinopathy status post multiple laser surgeries on bilateral eyes. 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 Current diabetic  treatment includes insulin injections. His weight is fluctuating minimally. He is following a generally unhealthy diet. When asked about meal planning, he reported none. He has not had a previous visit with a dietitian. He never participates in exercise. (He did not bring any meter nor logs to review today. His most recent A1c was 9.4% on 12/16/2017. Prior to that his A1c was 11.3% November 2018.) An ACE inhibitor/angiotensin II receptor blocker is not being taken. He does not see a podiatrist.Eye exam is current.  Hyperlipidemia  This is a chronic problem. The current episode started more than 1 year ago. The problem is uncontrolled. Exacerbating diseases include chronic renal disease and diabetes. Pertinent negatives include no chest pain, myalgias or shortness of breath. Current antihyperlipidemic treatment includes statins. Risk factors for coronary artery disease include diabetes mellitus, dyslipidemia, family history, hypertension, male sex, obesity and a sedentary lifestyle.  Hypertension  This is a chronic problem. The current episode started more than 1 year ago. The problem has been waxing and waning since onset. The problem is uncontrolled. Associated symptoms include blurred vision. Pertinent negatives include no chest pain, headaches, neck pain, palpitations or shortness of breath. Risk factors for coronary artery disease include sedentary lifestyle, male gender, obesity, dyslipidemia, diabetes mellitus and family history. Past treatments include beta blockers, diuretics, direct vasodilators and calcium channel blockers. Hypertensive end-organ damage includes PVD and retinopathy. Identifiable causes of hypertension include chronic renal disease.      Review of Systems  Constitutional: Positive for fatigue. Negative for chills, fever and unexpected weight change.       Wheelchair-bound due to right above-knee amputation of the competition of peripheral arterial disease related to uncontrolled  diabetes.  HENT: Negative for dental problem, mouth sores and trouble swallowing.   Eyes: Positive for blurred vision. Negative for visual disturbance.  Respiratory: Negative for cough, choking, chest tightness, shortness of breath and wheezing.   Cardiovascular: Negative for chest pain, palpitations and leg swelling.  Gastrointestinal: Negative for abdominal distention, abdominal pain, constipation, diarrhea, nausea and vomiting.  Endocrine: Positive for polydipsia. Negative for polyphagia and polyuria.  Genitourinary: Negative for dysuria, flank pain, hematuria and urgency.  Musculoskeletal: Positive for gait problem. Negative for back pain, joint swelling, myalgias and neck pain.       Wheelchair-bound due to right above-knee amputation of the competition of peripheral arterial disease related to uncontrolled diabetes.  Skin: Negative for pallor, rash and wound.  Neurological: Negative for seizures, syncope, weakness, numbness and headaches.  Psychiatric/Behavioral: Positive for dysphoric mood. Negative for confusion.    Objective:    BP (!) 148/90   Pulse 77   Wt Readings from Last 3 Encounters:  08/06/15 226 lb 1.6 oz (102.6 kg)  07/08/15 238 lb (108 kg)  06/20/15 238 lb (108 kg)     Physical Exam  Constitutional: He is oriented to person, place, and time. He is cooperative. No distress.  Wheelchair-bound due to right above-knee amputation of the competition of peripheral arterial disease related to uncontrolled diabetes.  HENT:  Head: Normocephalic and atraumatic.  Generally poor dental condition. He has not seen any dentist in recent years.  Eyes: EOM are normal.  Neck: Normal range of motion. Neck supple. No tracheal deviation present. No thyromegaly present.  Cardiovascular: Normal rate, S1 normal, S2 normal and normal heart sounds. Exam reveals no gallop.  No murmur heard. Pulses:      Dorsalis pedis pulses are 1+ on the right side, and 1+ on the left side.        Posterior tibial pulses are 1+ on the right side, and 1+ on the left side.  Pulmonary/Chest: Breath sounds normal. No respiratory distress. He has no wheezes.  Abdominal: Soft. Bowel sounds are normal. He exhibits no distension. There is no tenderness. There is no guarding and no CVA tenderness.  Musculoskeletal: He exhibits no edema.       Right shoulder: He exhibits no swelling and no deformity.  Wheelchair-bound due to right above-knee amputation of the competition of peripheral arterial disease related to uncontrolled diabetes.  Neurological: He is alert and oriented to person, place, and time. He has normal strength. He displays abnormal reflex. No cranial nerve deficit or sensory deficit. Gait normal.  Skin: Skin is warm. No rash noted. No cyanosis. Nails show no clubbing.  Dry, unhygienic left lower extremity. No active lesions, ulcers, infections.  Psychiatric: His speech is normal. Cognition and memory are normal.  Reluctant, hesitant affect.    CMP ( most recent) CMP     Component Value Date/Time   NA 137 08/14/2015 0650   K 3.5 08/14/2015 0650   CL 111 08/14/2015 0650   CO2 19 (L) 08/14/2015 0650   GLUCOSE 87 08/14/2015 0650   BUN 41 (H) 08/14/2015 0650   CREATININE 3.62 (H) 08/14/2015 0650   CALCIUM 7.0 (L) 08/14/2015 0650   PROT 6.6 07/09/2015 0559   ALBUMIN 1.6 (L) 08/12/2015 0655   AST 10 (L) 07/09/2015 0559   ALT 11 (L) 07/09/2015 0559   ALKPHOS 88 07/09/2015 0559   BILITOT 0.5 07/09/2015 0559   GFRNONAA 20 (L) 08/14/2015 0650   GFRAA 23 (L) 08/14/2015 0650     Diabetic Labs (most recent): Lab Results  Component Value Date   HGBA1C 9.4 12/16/2017   HGBA1C 11.3 10/18/2017   HGBA1C 9.6 (H) 07/08/2015     Lipid Panel ( most recent) Lipid Panel     Component Value Date/Time   CHOL 323 (A) 10/18/2017   TRIG 970 (A) 10/18/2017   HDL 28 (A) 10/18/2017  Lab Results  Component Value Date   TSH 1.18 10/18/2017       Assessment & Plan:   1.  Uncontrolled type 2 diabetes mellitus with ESRD (end-stage renal disease) (HCC)  - Maurice [name] has currently uncontrolled symptomatic type 2 DM since  42 years of age,  with most recent A1c of 9.4 %. Recent labs reviewed. His A1c was 11.3 in November 2018. - He has significant family history of complicated diabetes in his post parents, siblings, and grandparents. -his diabetes is complicated by ESRD on hemodialysis, peripheral arterial disease status post right above-knee amputation currently wheelchair-bound retinopathy status post multiple laser treatments and Maurice [name] remains at a high risk for more acute and chronic complications which include CAD, CVA, retinopathy, and neuropathy. These are all discussed in detail with the patient.  - I have counseled him on diet management and weight loss, by adopting a carbohydrate restricted/protein rich diet.  - Suggestion is made for him to avoid simple carbohydrates  from his diet including Cakes, Sweet Desserts, Ice Cream, Soda (diet and regular), Sweet Tea, Candies, Chips, Cookies, Store Bought Juices, Alcohol in Excess of  1-2 drinks a day, Artificial Sweeteners, and "Sugar-free" Products. This will help patient to have stable blood glucose profile and potentially avoid unintended weight gain.  - I encouraged him to switch to  unprocessed or minimally processed complex starch and increased protein intake (animal or plant source), fruits, and vegetables.  - he is advised to stick to a routine mealtimes to eat 3 meals  a day and avoid unnecessary snacks ( to snack only to correct hypoglycemia).   - he will be scheduled with Norm Salt, RDN, CDE for individualized diabetes education.  - I have approached him with the following individualized plan to manage diabetes and patient agrees:   - Patient displays a very hesitant attitude, however, unfortunately his only choice for treatment of diabetes to target is still  intensive basal/bolus insulin  therapy. - This will require for him to make significant adjustment in lifestyle, follow routine meal and insulin times. - He reluctantly accepts a new plan. - I'll proceed to readjust his basal insulin Levemir to 50 units daily at bedtime , and prandial insulin NovoLog to 10units 3 times a day with meals  for pre-meal BG readings of 90-150mg /dl, plus patient specific correction dose for unexpected hyperglycemia above 150mg /dl, associated with strict monitoring of glucose 4 times a day-before meals and at bedtime. - He asked to return in one week with his meter and logs for reevaluation.  - Patient is warned not to take insulin without proper monitoring per orders. -Adjustment parameters are given for hypo and hyperglycemia in writing. -Patient is encouraged to call clinic for blood glucose levels less than 70 or above 300 mg /dl. -Patient is not a candidate for metformin,SGLT2 inhibitors due to CKD.  - he will be considered for incretin therapy as appropriate next visit. - Patient specific target  A1c;  LDL, HDL, Triglycerides, and  Waist Circumference were discussed in detail.  2) BP/HTN:uncontrolled. Continue current medications.  3) Lipids/HPL:   : Controlled with significant dyslipidemia including triglycerides of 970.   Patient is advised to continue statins, Lovaza.  4)  Weight/Diet: CDE Consult will be initiated , exercise, and detailed carbohydrates information provided.  5) profound vitamin D deficiency: 25 hydroxy vitamin D Level of 6  - He is advised to continue cholecalciferol 2000 units daily. He's also on Os-Cal 1250 mg by mouth  daily. He may need calcitriol added to this regimen, at his nephrology follow-up.  6) Chronic Care/Health Maintenance:  -he  is on Statin medications and  is encouraged to continue to follow up with Ophthalmology, Dentist,  Podiatrist at least yearly or according to recommendations, and advised to   stay away from smoking. I have recommended yearly flu  vaccine and pneumonia vaccination at least every 5 years; moderate intensity exercise for up to 150 minutes weekly; and  sleep for at least 7 hours a day.  - I advised patient to maintain close follow up with Laural Golden, MD for primary care needs.  - Time spent with the patient: 1 hour, of which >50% was spent in obtaining information about his symptoms, reviewing his previous labs, evaluations, and treatments, counseling him about hiscurrently uncontrolled, complicated type 2 diabetes; hyperlipidemia; hypertension, and developing a plan for long term treatment; his  questions were answered to his satisfaction.  Follow up plan: - Return in about 1 week (around 12/29/2017) for follow up with meter and logs- no labs.  Marquis Lunch, MD [facility] [address] [place], [place] [address] Phone: [phone]  Fax: [phone]    12/22/2017, 12:27 PM  This note was partially dictated with voice recognition software. Similar sounding words can be transcribed inadequately or may not  be corrected upon review.

## 2017-12-22 NOTE — Patient Instructions (Signed)

## 2017-12-28 ENCOUNTER — Ambulatory Visit: Payer: Medicare Other | Admitting: "Endocrinology

## 2018-01-02 ENCOUNTER — Ambulatory Visit: Payer: Medicare Other | Admitting: "Endocrinology

## 2018-03-21 ENCOUNTER — Ambulatory Visit (INDEPENDENT_AMBULATORY_CARE_PROVIDER_SITE_OTHER): Payer: Medicare Other | Admitting: Infectious Disease

## 2018-03-21 ENCOUNTER — Encounter: Payer: Self-pay | Admitting: Infectious Disease

## 2018-03-21 VITALS — BP 152/80 | HR 82 | Temp 98.6°F

## 2018-03-21 DIAGNOSIS — Z0000 Encounter for general adult medical examination without abnormal findings: Secondary | ICD-10-CM | POA: Diagnosis not present

## 2018-03-21 DIAGNOSIS — E11628 Type 2 diabetes mellitus with other skin complications: Secondary | ICD-10-CM | POA: Diagnosis not present

## 2018-03-21 DIAGNOSIS — R7881 Bacteremia: Secondary | ICD-10-CM

## 2018-03-21 DIAGNOSIS — M86672 Other chronic osteomyelitis, left ankle and foot: Secondary | ICD-10-CM | POA: Diagnosis not present

## 2018-03-21 DIAGNOSIS — L089 Local infection of the skin and subcutaneous tissue, unspecified: Secondary | ICD-10-CM | POA: Diagnosis not present

## 2018-03-21 DIAGNOSIS — A400 Sepsis due to streptococcus, group A: Secondary | ICD-10-CM

## 2018-03-21 DIAGNOSIS — M86671 Other chronic osteomyelitis, right ankle and foot: Secondary | ICD-10-CM

## 2018-03-21 HISTORY — DX: Other chronic osteomyelitis, left ankle and foot: M86.672

## 2018-03-21 LAB — CBC WITH DIFFERENTIAL/PLATELET
BASOS PCT: 1.2 %
Basophils Absolute: 104 cells/uL (ref 0–200)
EOS ABS: 278 {cells}/uL (ref 15–500)
Eosinophils Relative: 3.2 %
HEMATOCRIT: 31.2 % — AB (ref 38.5–50.0)
HEMOGLOBIN: 10.6 g/dL — AB (ref 13.2–17.1)
LYMPHS ABS: 2079 {cells}/uL (ref 850–3900)
MCH: 28.7 pg (ref 27.0–33.0)
MCHC: 34 g/dL (ref 32.0–36.0)
MCV: 84.6 fL (ref 80.0–100.0)
MPV: 9.1 fL (ref 7.5–12.5)
Monocytes Relative: 8.1 %
NEUTROS ABS: 5533 {cells}/uL (ref 1500–7800)
Neutrophils Relative %: 63.6 %
Platelets: 284 10*3/uL (ref 140–400)
RBC: 3.69 10*6/uL — AB (ref 4.20–5.80)
RDW: 17 % — ABNORMAL HIGH (ref 11.0–15.0)
Total Lymphocyte: 23.9 %
WBC: 8.7 10*3/uL (ref 3.8–10.8)
WBCMIX: 705 {cells}/uL (ref 200–950)

## 2018-03-21 LAB — SEDIMENTATION RATE: Sed Rate: 58 mm/h — ABNORMAL HIGH (ref 0–15)

## 2018-03-21 NOTE — Progress Notes (Signed)
Reason for consultation: Diabetic foot infection with osteomyelitis of fifth metatarsal  Requesting physician Dr. Theola Sequin   Subjective:      Patient ID: Maurice [name], male    DOB: [birth year], 41 y.o.   MRN: [id number]  HPI  This is a 42 year old male who suffers from hypertension and difficult to control diabetes mellitus with peripheral vascular disease and chronic kidney disease end-stage renal disease in fact now on peritoneal dialysis who has had a past medical history there is further significant for him suffering from a severe right sided diabetic foot infection which evolved into osteomyelitis with group A streptococcal bacteremia and a sending infection that required an above-the-knee amputation.  Has now developed an ulcer on his left fifth toe.  This is happened in the last 2 months and he has been followed closely by Dr. [name] with rocking him foot and ankle.  Patient has had a problem with quarterly superficial infection and AV fistula surgical site.  This required debridement and he was placed on Keflex for that.  While on the Keflex he did have some improvement in the ulcer in his foot.  There was some concern about it however and his podiatrist did a culture from this wound which did not reveal any organisms though it was while the patient was on Keflex.  He has since been placed on clindamycin.  Wound has however not resolved and an MRI was obtained on April 10.  This shows soft tissue ulceration lateral to the distal left foot at the level of the mid to distal metatarsal bones with associated inflammation and osteomyelitis and the underlying distal half of the fifth metatarsal.  He has continued on clindamycin and also fluconazole which his nephrologist wants him to take whenever he is on antibacterial antibiotics as a "prophylactic technique.  He has been referred to [place] for further evaluation and management.  He tells me that he has had a prior angiogram but I  cannot find it in the system and looking through the notes or procedures.  Followed by [facility] nephrology and urology and apparently they have a vascular surgeon who works there is a perhaps it happened there otherwise thought he told me during my visit with him today that it happened at any [facility].      Past Medical History:  Diagnosis Date  . Anxiety   . Arthritis   . Barrett's esophagus   . Blood clot in vein    pt sts" it may have been in artery and not vein" of right leg  . Chronic osteomyelitis of left foot (HCC) 03/21/2018  . Depression   . Diabetes mellitus with complication (HCC) 06/16/2015  . Diabetes mellitus without complication (HCC)   . Diabetic foot infection (HCC) 06/16/2015   S/p debridement and wound VAC  . Diabetic retinopathy of both eyes, associated with type 2 diabetes mellitus   . GERD (gastroesophageal reflux disease)   . H/O hiatal hernia   . Herniated disc, cervical    c5 and c6 and c6 and c7  . Hypertension   . Kidney failure    stage 4 just dx 4 months ago.Sees Dr Elby Beck, nephrologist in [place] [phone].  .  . Neuropathy   . Osteomyelitis of right foot (HCC) 06/17/2015   S/p debridement, Dr. [name]  . PAC (premature atrial contraction)   . PVC (premature ventricular contraction)   . Sepsis (HCC)   . Sleep apnea    pt has complex sleep  apnea; waiting on CPAP    Past Surgical History:  Procedure Laterality Date  . AMPUTATION Right 08/10/2015   Procedure: RIGHT LEG ABOVE KNEE AMPUTATION;  Surgeon: Franky Macho, MD;  Location: [facility];  Service: General;  Laterality: Right;  . APPLICATION OF WOUND VAC Right 06/19/2015   Procedure: APPLICATION OF WOUND VAC;  Surgeon: [name], DPM;  Location: [facility];  Service: Podiatry;  Laterality: Right;  . APPLICATION OF WOUND VAC Right 07/10/2015   Procedure: APPLICATION OF WOUND VAC;  Surgeon: [name], DPM;  Location: [facility];  Service: Podiatry;  Laterality: Right;  .  DEBRIDEMENT LEG     lower leg-left  . IRRIGATION AND DEBRIDEMENT FOOT Right 06/19/2015   Procedure: IRRIGATION AND DEBRIDEMENT FOOT ;  Surgeon: [name], DPM;  Location: [facility];  Service: Podiatry;  Laterality: Right;  . METATARSAL HEAD EXCISION Right 02/14/2013   Procedure: 4TH METATARSAL HEAD RESECTION RIGHT FOOT;  Surgeon: Dallas Schimke, DPM;  Location: [facility];  Service: Orthopedics;  Laterality: Right;  latex allergy  . TOE AMPUTATION     5th digit right foot-[place]  . VITRECTOMY Right   . WOUND DEBRIDEMENT Right 07/10/2015   Procedure: DEBRIDEMENT WOUND SUBCUTANEOUS TISSUE, MUSCLE, BONE RIGHT FOOT;  Surgeon: [name], DPM;  Location: [facility];  Service: Podiatry;  Laterality: Right;  . WRIST ARTHROSCOPY     left with no break reapir of tendond and ligamants    Family History  Problem Relation Age of Onset  . Cancer Mother        liver lung  . Diabetes Mother   . Cancer Father        liver  . Diabetes Father       Social History   Socioeconomic History  . Marital status: Married    Spouse name: Not on file  . Number of children: Not on file  . Years of education: Not on file  . Highest education level: Not on file  Occupational History  . Not on file  Social Needs  . Financial resource strain: Not on file  . Food insecurity:    Worry: Not on file    Inability: Not on file  . Transportation needs:    Medical: Not on file    Non-medical: Not on file  Tobacco Use  . Smoking status: Former Smoker    Packs/day: 0.25    Years: 20.00    Pack years: 5.00    Types: Cigarettes    Last attempt to quit: 06/16/2015    Years since quitting: 2.7  . Smokeless tobacco: Current User    Types: Chew  Substance and Sexual Activity  . Alcohol use: Yes    Alcohol/week: 0.0 oz    Comment: rarely  . Drug use: No  . Sexual activity: Yes    Birth control/protection: None  Lifestyle  . Physical activity:    Days per week: Not on file    Minutes per  session: Not on file  . Stress: Not on file  Relationships  . Social connections:    Talks on phone: Not on file    Gets together: Not on file    Attends religious service: Not on file    Active member of club or organization: Not on file    Attends meetings of clubs or organizations: Not on file    Relationship status: Not on file  Other Topics Concern  . Not on file  Social History Narrative  . Not on file  Allergies  Allergen Reactions  . Daptomycin     Hypotension   . Bactrim [Sulfamethoxazole-Trimethoprim] Other (See Comments)    Cannot take due to kidney issue  . Dapsone Nausea And Vomiting  . Latex Other (See Comments)    Blisters.   . Penicillins Hives  . Vancomycin Itching     Current Outpatient Medications:  .  acetaminophen (TYLENOL) 325 MG tablet, Take 2 tablets (650 mg total) by mouth every 6 (six) hours as needed for mild pain (or Fever >/= 101)., Disp: , Rfl:  .  ALPRAZolam (XANAX) 0.5 MG tablet, Take 0.5 mg by mouth 3 (three) times daily., Disp: , Rfl:  .  amLODipine (NORVASC) 10 MG tablet, Take 10 mg by mouth daily., Disp: , Rfl:  .  apixaban (ELIQUIS) 2.5 MG TABS tablet, Take 5 mg by mouth 2 (two) times daily. , Disp: , Rfl:  .  atorvastatin (LIPITOR) 40 MG tablet, Take 40 mg by mouth daily., Disp: , Rfl:  .  calcium carbonate (OS-CAL - DOSED IN MG OF ELEMENTAL CALCIUM) 1250 (500 CA) MG tablet, Take 1 tablet (500 mg of elemental calcium total) by mouth daily., Disp: , Rfl:  .  Cholecalciferol (VITAMIN D) 2000 units CAPS, Take by mouth daily., Disp: , Rfl:  .  cyclobenzaprine (FLEXERIL) 10 MG tablet, Take 10 mg by mouth 2 (two) times daily., Disp: , Rfl:  .  fluticasone (FLONASE) 50 MCG/ACT nasal spray, Place into both nostrils daily., Disp: , Rfl:  .  furosemide (LASIX) 40 MG tablet, Take 1 tablet (40 mg total) by mouth daily as needed for fluid., Disp: , Rfl:  .  hydrALAZINE (APRESOLINE) 100 MG tablet, Take 100 mg by mouth 2 (two) times daily., Disp: ,  Rfl:  .  Insulin Aspart (NOVOLOG FLEXPEN [place]), Inject 10-16 Units into the skin 3 (three) times daily before meals., Disp: , Rfl:  .  Insulin Detemir (LEVEMIR FLEXTOUCH [place]), Inject 50 Units into the skin at bedtime., Disp: , Rfl:  .  iron polysaccharides (NIFEREX) 150 MG capsule, Take 1 capsule (150 mg total) by mouth 2 (two) times daily., Disp: 60 capsule, Rfl: 1 .  metoprolol (LOPRESSOR) 50 MG tablet, Take 50 mg by mouth 2 (two) times daily., Disp: , Rfl:  .  mometasone (NASONEX) 50 MCG/ACT nasal spray, Place 2 sprays into the nose daily as needed (for allergies). , Disp: , Rfl:  .  omega-3 acid ethyl esters (LOVAZA) 1 G capsule, Take 1,000 mg by mouth 2 (two) times daily., Disp: , Rfl: 11 .  oxycodone (ROXICODONE) 30 MG immediate release tablet, Take 30 mg by mouth 3 (three) times daily. , Disp: , Rfl:  .  potassium chloride 3 mEq/L, magnesium sulfate 1 mEq/L in sodium chloride 0.9 % 3,000 mL, , Disp: , Rfl:  .  ranitidine (ZANTAC) 150 MG tablet, Take 150 mg by mouth 2 (two) times daily., Disp: , Rfl:  .  sodium bicarbonate 650 MG tablet, Take 1 tablet (650 mg total) by mouth 2 (two) times daily., Disp: , Rfl:  .  Vilazodone HCl (VIIBRYD) 20 MG TABS, Take 20 mg by mouth daily., Disp: , Rfl:     Review of Systems  Constitutional: Negative for activity change, appetite change, chills, diaphoresis, fatigue, fever and unexpected weight change.  HENT: Negative for congestion, rhinorrhea, sinus pressure, sneezing, sore throat and trouble swallowing.   Eyes: Negative for photophobia and visual disturbance.  Respiratory: Negative for cough, chest tightness, shortness of breath, wheezing and stridor.  Cardiovascular: Negative for chest pain, palpitations and leg swelling.  Gastrointestinal: Negative for abdominal distention, abdominal pain, anal bleeding, blood in stool, constipation, diarrhea, nausea and vomiting.  Genitourinary: Negative for difficulty urinating, dysuria, flank pain and hematuria.    Musculoskeletal: Negative for arthralgias, back pain, gait problem, joint swelling and myalgias.  Skin: Positive for color change and wound. Negative for pallor and rash.  Neurological: Negative for dizziness, tremors, weakness and light-headedness.  Hematological: Negative for adenopathy. Does not bruise/bleed easily.  Psychiatric/Behavioral: Negative for agitation, behavioral problems, confusion, decreased concentration, dysphoric mood and sleep disturbance.       Objective:   Physical Exam  Constitutional: He is oriented to person, place, and time. He appears well-developed and well-nourished.  HENT:  Head: Normocephalic and atraumatic.  Eyes: Conjunctivae and EOM are normal.  Neck: Normal range of motion. Neck supple.  Cardiovascular: Normal rate and regular rhythm.  Pulmonary/Chest: Effort normal. No respiratory distress. He has no wheezes.  Abdominal: Soft. He exhibits no distension.  Musculoskeletal: Normal range of motion. He exhibits no edema or tenderness.  Neurological: He is alert and oriented to person, place, and time.  Skin: Skin is warm and dry. No rash noted. There is erythema. No pallor.  Psychiatric: He has a normal mood and affect. His behavior is normal. Judgment and thought content normal.   He has some erythema of his left calf which I expect is partly venous stasis phenomena there is some ulcerations where he has sustained some injuries but no obvious infection there.  Below are pictures of his foot taken today on March 21, 2018:               Assessment & Plan:   Diabetic foot ulcer with osteomyelitis of his fifth metatarsal and the patient is already had an above-the-knee amputation and has multiple comorbidities including chronic kidney disease on peritoneal dialysis, hypertension and peripheral vascular disease:  Diabetic foot infections are certainly very hard to treat in particular if they involve the bone in particular in a patient who has  vascular disease as Maurice [name] is known to have.  One could that are trying to obtain debrided bone from this metatarsal for culture but typically most physicians I have asked to obtain bone cultures have stated that this is potentially more destructive through the bone that they are sampling if it such a small bone test was the one he has in his toe.  One could consider getting deeper cultures without disrupting the bone but really bone cultures will be the most informative diagnostic maneuver that we could to give [place] an idea of what pathogen or pathogens might be causing this infection.  Again due to the problems of obtaining such a biopsy without disrupting the bone I do not think there is likely going to be a great benefit.  Furthermore I am highly skeptical that we will be able to cure this infection with antibiotics alone.  I think he is going to need an amputation of his fifth toe to achieve control of this infection.  I have asked him to stop his clindamycin and he will also stop his fluconazole.  He is seeing his podiatrist on Monday and his wife and fax was texting him during the visit.  I personally think the patient as I mentioned needs an amputation.  I would like if he does pursue an amputation for him to stay off antibiotics for at least 2 weeks prior to such a procedure longer may even  give [place] greater ability to give [place] a useful culture.  I would like the cultures from the operating room sent in sterille containers) for example the container used to collect urine analysis and culture is a perfect we find one) actual tissue and bone sent to the microbiology lab for culture.  I would then craft a "mop up" course of antibiotics for him to take to help to eradicate the infection.  One thing that you have concerned about is how good the patient's blood supply is in this area and if he will be able to heal the site.   I think would be prudent for a vascular surgeon to assess this.  The  patient says he did not enjoy the angiogram that he had most recently.  In any case I will discuss this with his referring physician.  However I do not think he is going to be able to gain control of this area of osteomyelitis absent amputation and I worry about the risk of progression more proximally or the risk of bacteremia which the patient has had once before.  Trying to do IV antibiotics and this patient would require a central line given his current chronic kidney disease and given the desire of his nephrologist and vascular surgeons to preserve future options.  We may try to do regimen with a highly built bioavailable drug such as Zyvox or fluoroquinolone  We will check some baseline labs of the sed rate and C-reactive protein today.  Patient tells me he is tested negative for hepatitis C and negative for HIV 1 year ago so I will not test those labs.  I will call his podiatrist since I have his cell phone number  [phone]  I really do not think that we are going to achieve a cure of this infection through antibiotics alone.  I spent greater than 55  minutes with the patient including greater than 50% of time in face to face counsel of the patient and his wife with regards to the nature of diabetic foot infections osteomyelitis and various modalities of treatment, counseling him regarding my opinion that he will need an amputation of the toe to achieve control of this infection and eradicated and conveying my skepticism that chronic antibiotics alone would be sufficient. And in coordination of his care.

## 2018-03-22 LAB — C-REACTIVE PROTEIN: CRP: 17.6 mg/L — AB (ref <8.0)

## 2018-04-11 ENCOUNTER — Encounter: Payer: Self-pay | Admitting: Infectious Disease

## 2018-04-11 ENCOUNTER — Ambulatory Visit (INDEPENDENT_AMBULATORY_CARE_PROVIDER_SITE_OTHER): Payer: Medicare Other | Admitting: Infectious Disease

## 2018-04-11 VITALS — BP 152/69 | HR 79 | Temp 98.4°F | Resp 18 | Ht 74.0 in | Wt 230.0 lb

## 2018-04-11 DIAGNOSIS — M86672 Other chronic osteomyelitis, left ankle and foot: Secondary | ICD-10-CM

## 2018-04-11 DIAGNOSIS — M86671 Other chronic osteomyelitis, right ankle and foot: Secondary | ICD-10-CM

## 2018-04-11 DIAGNOSIS — E11628 Type 2 diabetes mellitus with other skin complications: Secondary | ICD-10-CM | POA: Diagnosis present

## 2018-04-11 DIAGNOSIS — L089 Local infection of the skin and subcutaneous tissue, unspecified: Secondary | ICD-10-CM | POA: Diagnosis not present

## 2018-04-11 NOTE — Progress Notes (Signed)
Chief compalint: followup for DFU with osteomyelitis  Subjective:      Patient ID: Maurice [name], male    DOB: [birth year], 42 y.o.   MRN: [id number]  HPI  This is a 42 year old male who suffers from hypertension and difficult to control diabetes mellitus with peripheral vascular disease and chronic kidney disease end-stage renal disease in fact now on peritoneal dialysis who has had a past medical history there is further significant for him suffering from a severe right sided diabetic foot infection which evolved into osteomyelitis with group A streptococcal bacteremia and a sending infection that required an above-the-knee amputation.  Has now developed an ulcer on his left fifth toe.  This is happened in the last 2 months and he has been followed closely by Dr. [name] with rocking him foot and ankle.  Patient has had a problem with quarterly superficial infection and AV fistula surgical site.  This required debridement and he was placed on Keflex for that.  While on the Keflex he did have some improvement in the ulcer in his foot.  There was some concern about it however and his podiatrist did a culture from this wound which did not reveal any organisms though it was while the patient was on Keflex.  He has since been placed on clindamycin.  Wound has however not resolved and an MRI was obtained on April 10.  This shows soft tissue ulceration lateral to the distal left foot at the level of the mid to distal metatarsal bones with associated inflammation and osteomyelitis and the underlying distal half of the fifth metatarsal.  He had continued on clindamycin and also fluconazole which his nephrologist wants him to take whenever he is on antibacterial antibiotics as a "prophylactic technique.  He has been referred to [place] for further evaluation and management.  He told me that he has had a prior angiogram but I cannot find it in the system and looking through the notes or procedures.    Since  I last saw him I took him off of antibiiotics and touched base with Dr. Eilene Ghazi with re to potential surgical options. I was and remained worried he will need an amputation to cure this infection but the fact taht he already has a BKA on other side makes any thought of this difficcult.  We stopped abx as mentioned in anticipation of surgery. He has had angiogram and going to get another VVS study.  He reportedly has good flow on angiogram. His DFU wound is healing up well without antibiotics and with wound care and vigilant weekly visits with Dr. Eilene Ghazi.     Past Medical History:  Diagnosis Date  . Anxiety   . Arthritis   . Barrett's esophagus   . Blood clot in vein    pt sts" it may have been in artery and not vein" of right leg  . Chronic osteomyelitis of left foot (HCC) 03/21/2018  . Depression   . Diabetes mellitus with complication (HCC) 06/16/2015  . Diabetes mellitus without complication (HCC)   . Diabetic foot infection (HCC) 06/16/2015   S/p debridement and wound VAC  . Diabetic retinopathy of both eyes, associated with type 2 diabetes mellitus   . GERD (gastroesophageal reflux disease)   . H/O hiatal hernia   . Herniated disc, cervical    c5 and c6 and c6 and c7  . Hypertension   . Kidney failure    stage 4 just dx 4 months ago.Sees Dr Elby Beck, nephrologist  in [place] [phone].  .  . Neuropathy   . Osteomyelitis of right foot (HCC) 06/17/2015   S/p debridement, Dr. [name]  . PAC (premature atrial contraction)   . PVC (premature ventricular contraction)   . Sepsis (HCC)   . Sleep apnea    pt has complex sleep apnea; waiting on CPAP    Past Surgical History:  Procedure Laterality Date  . AMPUTATION Right 08/10/2015   Procedure: RIGHT LEG ABOVE KNEE AMPUTATION;  Surgeon: Franky Macho, MD;  Location: [facility];  Service: General;  Laterality: Right;  . APPLICATION OF WOUND VAC Right 06/19/2015   Procedure: APPLICATION OF WOUND VAC;  Surgeon: [name], DPM;  Location: [facility];  Service: Podiatry;  Laterality: Right;  . APPLICATION OF WOUND VAC Right 07/10/2015   Procedure: APPLICATION OF WOUND VAC;  Surgeon: [name], DPM;  Location: [facility];  Service: Podiatry;  Laterality: Right;  . DEBRIDEMENT LEG     lower leg-left  . IRRIGATION AND DEBRIDEMENT FOOT Right 06/19/2015   Procedure: IRRIGATION AND DEBRIDEMENT FOOT ;  Surgeon: [name], DPM;  Location: [facility];  Service: Podiatry;  Laterality: Right;  . METATARSAL HEAD EXCISION Right 02/14/2013   Procedure: 4TH METATARSAL HEAD RESECTION RIGHT FOOT;  Surgeon: Dallas Schimke, DPM;  Location: [facility];  Service: Orthopedics;  Laterality: Right;  latex allergy  . TOE AMPUTATION     5th digit right foot-[place]  . VITRECTOMY Right   . WOUND DEBRIDEMENT Right 07/10/2015   Procedure: DEBRIDEMENT WOUND SUBCUTANEOUS TISSUE, MUSCLE, BONE RIGHT FOOT;  Surgeon: [name], DPM;  Location: [facility];  Service: Podiatry;  Laterality: Right;  . WRIST ARTHROSCOPY     left with no break reapir of tendond and ligamants    Family History  Problem Relation Age of Onset  . Cancer Mother        liver lung  . Diabetes Mother   . Cancer Father        liver  . Diabetes Father       Social History   Socioeconomic History  . Marital status: Married    Spouse name: Not on file  . Number of children: Not on file  . Years of education: Not on file  . Highest education level: Not on file  Occupational History  . Not on file  Social Needs  . Financial resource strain: Not on file  . Food insecurity:    Worry: Not on file    Inability: Not on file  . Transportation needs:    Medical: Not on file    Non-medical: Not on file  Tobacco Use  . Smoking status: Former Smoker    Packs/day: 0.25    Years: 20.00    Pack years: 5.00    Types: Cigarettes    Last attempt to quit: 06/16/2015    Years since quitting: 2.8  . Smokeless tobacco: Current User    Types: Chew    Substance and Sexual Activity  . Alcohol use: Yes    Alcohol/week: 0.0 oz    Comment: rarely  . Drug use: No  . Sexual activity: Yes    Birth control/protection: None  Lifestyle  . Physical activity:    Days per week: Not on file    Minutes per session: Not on file  . Stress: Not on file  Relationships  . Social connections:    Talks on phone: Not on file    Gets together: Not on file    Attends  religious service: Not on file    Active member of club or organization: Not on file    Attends meetings of clubs or organizations: Not on file    Relationship status: Not on file  Other Topics Concern  . Not on file  Social History Narrative  . Not on file    Allergies  Allergen Reactions  . Daptomycin     Hypotension   . Bactrim [Sulfamethoxazole-Trimethoprim] Other (See Comments)    Cannot take due to kidney issue  . Dapsone Nausea And Vomiting  . Latex Other (See Comments)    Blisters.   . Penicillins Hives  . Vancomycin Itching     Current Outpatient Medications:  .  acetaminophen (TYLENOL) 325 MG tablet, Take 2 tablets (650 mg total) by mouth every 6 (six) hours as needed for mild pain (or Fever >/= 101)., Disp: , Rfl:  .  ALPRAZolam (XANAX) 0.5 MG tablet, Take 0.5 mg by mouth 3 (three) times daily., Disp: , Rfl:  .  amLODipine (NORVASC) 10 MG tablet, Take 10 mg by mouth daily., Disp: , Rfl:  .  apixaban (ELIQUIS) 2.5 MG TABS tablet, Take 5 mg by mouth 2 (two) times daily. , Disp: , Rfl:  .  atorvastatin (LIPITOR) 40 MG tablet, Take 40 mg by mouth daily., Disp: , Rfl:  .  calcium carbonate (OS-CAL - DOSED IN MG OF ELEMENTAL CALCIUM) 1250 (500 CA) MG tablet, Take 1 tablet (500 mg of elemental calcium total) by mouth daily., Disp: , Rfl:  .  Cholecalciferol (VITAMIN D) 2000 units CAPS, Take by mouth daily., Disp: , Rfl:  .  cyclobenzaprine (FLEXERIL) 10 MG tablet, Take 10 mg by mouth 2 (two) times daily., Disp: , Rfl:  .  fluticasone (FLONASE) 50 MCG/ACT nasal spray,  Place into both nostrils daily., Disp: , Rfl:  .  furosemide (LASIX) 40 MG tablet, Take 1 tablet (40 mg total) by mouth daily as needed for fluid., Disp: , Rfl:  .  hydrALAZINE (APRESOLINE) 100 MG tablet, Take 100 mg by mouth 2 (two) times daily., Disp: , Rfl:  .  Insulin Aspart (NOVOLOG FLEXPEN [place]), Inject 10-16 Units into the skin 3 (three) times daily before meals., Disp: , Rfl:  .  Insulin Detemir (LEVEMIR FLEXTOUCH [place]), Inject 50 Units into the skin at bedtime., Disp: , Rfl:  .  metoprolol (LOPRESSOR) 50 MG tablet, Take 50 mg by mouth 2 (two) times daily., Disp: , Rfl:  .  mometasone (NASONEX) 50 MCG/ACT nasal spray, Place 2 sprays into the nose daily as needed (for allergies). , Disp: , Rfl:  .  omega-3 acid ethyl esters (LOVAZA) 1 G capsule, Take 1,000 mg by mouth 2 (two) times daily., Disp: , Rfl: 11 .  oxycodone (ROXICODONE) 30 MG immediate release tablet, Take 30 mg by mouth 3 (three) times daily. , Disp: , Rfl:  .  sodium bicarbonate 650 MG tablet, Take 1 tablet (650 mg total) by mouth 2 (two) times daily., Disp: , Rfl:  .  Vilazodone HCl (VIIBRYD) 20 MG TABS, Take 20 mg by mouth daily., Disp: , Rfl:  .  iron polysaccharides (NIFEREX) 150 MG capsule, Take 1 capsule (150 mg total) by mouth 2 (two) times daily. (Patient not taking: Reported on 04/11/2018), Disp: 60 capsule, Rfl: 1 .  potassium chloride 3 mEq/L, magnesium sulfate 1 mEq/L in sodium chloride 0.9 % 3,000 mL, , Disp: , Rfl:  .  ranitidine (ZANTAC) 150 MG tablet, Take 150 mg by mouth 2 (two) times  daily., Disp: , Rfl:     Review of Systems  Constitutional: Negative for activity change, appetite change, chills, diaphoresis, fatigue, fever and unexpected weight change.  HENT: Negative for congestion, rhinorrhea, sinus pressure, sneezing, sore throat and trouble swallowing.   Eyes: Negative for photophobia and visual disturbance.  Respiratory: Negative for cough, chest tightness, shortness of breath, wheezing and stridor.     Cardiovascular: Negative for chest pain, palpitations and leg swelling.  Gastrointestinal: Negative for abdominal distention, abdominal pain, anal bleeding, blood in stool, constipation, diarrhea, nausea and vomiting.  Genitourinary: Negative for difficulty urinating, dysuria, flank pain and hematuria.  Musculoskeletal: Negative for arthralgias, back pain, gait problem, joint swelling and myalgias.  Skin: Positive for wound. Negative for pallor and rash.  Neurological: Negative for dizziness, tremors, weakness and light-headedness.  Hematological: Negative for adenopathy. Does not bruise/bleed easily.  Psychiatric/Behavioral: Negative for agitation, behavioral problems, confusion, decreased concentration, dysphoric mood and sleep disturbance.       Objective:   Physical Exam  Constitutional: He is oriented to person, place, and time. He appears well-developed and well-nourished.  HENT:  Head: Normocephalic and atraumatic.  Eyes: Conjunctivae and EOM are normal.  Neck: Normal range of motion. Neck supple.  Cardiovascular: Normal rate and regular rhythm.  Pulmonary/Chest: Effort normal. No respiratory distress. He has no wheezes.  Abdominal: Soft. He exhibits no distension.  Musculoskeletal: Normal range of motion. He exhibits no edema or tenderness.  Neurological: He is alert and oriented to person, place, and time.  Skin: Skin is warm and dry. No rash noted. No pallor.  Psychiatric: He has a normal mood and affect. His behavior is normal. Judgment and thought content normal.     Below are pictures of his foot takenon March 21, 2018:         Photo today 04/11/18:           Assessment & Plan:   Diabetic foot ulcer with osteomyelitis of his fifth metatarsal and the patient is already had an above-the-knee amputation and has multiple comorbidities including chronic kidney disease on peritoneal dialysis, hypertension and peripheral vascular disease:  assess this.  The  patient says he did not enjoy the angiogram that he had most recently.  His podiatrist wants to monitor him given that wound is healing up.  I remain concerned that he has underlying osteo that may smolder then spread proximally or cause disseminated infection but as long as he is being followed very closely I am alright with watchful waiting  I would not institute antibiotics at this point in time  I will call his podiatrist since I have his cell phone number  [phone]  I spent greater than 40 minutes with the patient including greater than 50% of time in face to face counsel of the patient  Re the nature of DFU, blood supply and nature of osteomyelitis and in coordination of his care.

## 2018-05-22 ENCOUNTER — Ambulatory Visit: Payer: Medicare Other | Admitting: Infectious Disease

## 2018-05-29 ENCOUNTER — Ambulatory Visit: Payer: Medicare Other | Admitting: Infectious Disease

## 2018-06-21 ENCOUNTER — Ambulatory Visit: Payer: Medicare Other | Admitting: Family

## 2018-06-21 NOTE — Progress Notes (Deleted)
Subjective:    Patient ID: Maurice [name], male    DOB: 1976-10-13, 42 y.o.   MRN: [id number]  No chief complaint on file.    HPI:  Maurice [name] is a 42 y.o. male who presents today for routine follow up of his diabetic foot infection and chronic osteomyelitis.   Maurice [name] was last seen in the office on 04/11/18 for follow up with Dr. Daiva Eves with a ulcer located on his left 5th toe that developed 2 months prior to the office visit. He is followed by Dr. [name] and had completed a course of Keflex. Cultures obtained were negative likely effected by the Keflex. He was then placed on clindamycin. MRI on 03/07/18 with concern for osteomyelitis. His blood flow appeared to be adequate. It was recommended to continue wound care without antimicrobial therapy at the time with concern for possible smoldering infection.      Allergies  Allergen Reactions  . Daptomycin     Hypotension   . Bactrim [Sulfamethoxazole-Trimethoprim] Other (See Comments)    Cannot take due to kidney issue  . Dapsone Nausea And Vomiting  . Latex Other (See Comments)    Blisters.   . Penicillins Hives  . Vancomycin Itching      Outpatient Medications Prior to Visit  Medication Sig Dispense Refill  . acetaminophen (TYLENOL) 325 MG tablet Take 2 tablets (650 mg total) by mouth every 6 (six) hours as needed for mild pain (or Fever >/= 101).    Marland Kitchen ALPRAZolam (XANAX) 0.5 MG tablet Take 0.5 mg by mouth 3 (three) times daily.    Marland Kitchen amLODipine (NORVASC) 10 MG tablet Take 10 mg by mouth daily.    Marland Kitchen apixaban (ELIQUIS) 2.5 MG TABS tablet Take 5 mg by mouth 2 (two) times daily.     Marland Kitchen atorvastatin (LIPITOR) 40 MG tablet Take 40 mg by mouth daily.    . calcium carbonate (OS-CAL - DOSED IN MG OF ELEMENTAL CALCIUM) 1250 (500 CA) MG tablet Take 1 tablet (500 mg of elemental calcium total) by mouth daily.    . Cholecalciferol (VITAMIN D) 2000 units CAPS Take by mouth daily.    . cyclobenzaprine (FLEXERIL) 10 MG tablet Take 10  mg by mouth 2 (two) times daily.    . fluticasone (FLONASE) 50 MCG/ACT nasal spray Place into both nostrils daily.    . furosemide (LASIX) 40 MG tablet Take 1 tablet (40 mg total) by mouth daily as needed for fluid.    . hydrALAZINE (APRESOLINE) 100 MG tablet Take 100 mg by mouth 2 (two) times daily.    . Insulin Aspart (NOVOLOG FLEXPEN [place]) Inject 10-16 Units into the skin 3 (three) times daily before meals.    . Insulin Detemir (LEVEMIR FLEXTOUCH [place]) Inject 50 Units into the skin at bedtime.    . iron polysaccharides (NIFEREX) 150 MG capsule Take 1 capsule (150 mg total) by mouth 2 (two) times daily. (Patient not taking: Reported on 04/11/2018) 60 capsule 1  . metoprolol (LOPRESSOR) 50 MG tablet Take 50 mg by mouth 2 (two) times daily.    . mometasone (NASONEX) 50 MCG/ACT nasal spray Place 2 sprays into the nose daily as needed (for allergies).     Marland Kitchen omega-3 acid ethyl esters (LOVAZA) 1 G capsule Take 1,000 mg by mouth 2 (two) times daily.  11  . oxycodone (ROXICODONE) 30 MG immediate release tablet Take 30 mg by mouth 3 (three) times daily.     . potassium chloride 3 mEq/L,  magnesium sulfate 1 mEq/L in sodium chloride 0.9 % 3,000 mL     . ranitidine (ZANTAC) 150 MG tablet Take 150 mg by mouth 2 (two) times daily.    . sodium bicarbonate 650 MG tablet Take 1 tablet (650 mg total) by mouth 2 (two) times daily.    . Vilazodone HCl (VIIBRYD) 20 MG TABS Take 20 mg by mouth daily.     No facility-administered medications prior to visit.      Past Medical History:  Diagnosis Date  . Anxiety   . Arthritis   . Barrett's esophagus   . Blood clot in vein    pt sts" it may have been in artery and not vein" of right leg  . Chronic osteomyelitis of left foot (HCC) 03/21/2018  . Depression   . Diabetes mellitus with complication (HCC) 06/16/2015  . Diabetes mellitus without complication (HCC)   . Diabetic foot infection (HCC) 06/16/2015   S/p debridement and wound VAC  . Diabetic retinopathy of both  eyes, associated with type 2 diabetes mellitus   . GERD (gastroesophageal reflux disease)   . H/O hiatal hernia   . Herniated disc, cervical    c5 and c6 and c6 and c7  . Hypertension   . Kidney failure    stage 4 just dx 4 months ago.Sees Dr Elby Beck, nephrologist in [place] [phone].  .  . Neuropathy   . Osteomyelitis of right foot (HCC) 06/17/2015   S/p debridement, Dr. [name]  . PAC (premature atrial contraction)   . PVC (premature ventricular contraction)   . Sepsis (HCC)   . Sleep apnea    pt has complex sleep apnea; waiting on CPAP     Past Surgical History:  Procedure Laterality Date  . AMPUTATION Right 08/10/2015   Procedure: RIGHT LEG ABOVE KNEE AMPUTATION;  Surgeon: Franky Macho, MD;  Location: [facility];  Service: General;  Laterality: Right;  . APPLICATION OF WOUND VAC Right 06/19/2015   Procedure: APPLICATION OF WOUND VAC;  Surgeon: [name], DPM;  Location: [facility];  Service: Podiatry;  Laterality: Right;  . APPLICATION OF WOUND VAC Right 07/10/2015   Procedure: APPLICATION OF WOUND VAC;  Surgeon: [name], DPM;  Location: [facility];  Service: Podiatry;  Laterality: Right;  . DEBRIDEMENT LEG     lower leg-left  . IRRIGATION AND DEBRIDEMENT FOOT Right 06/19/2015   Procedure: IRRIGATION AND DEBRIDEMENT FOOT ;  Surgeon: [name], DPM;  Location: [facility];  Service: Podiatry;  Laterality: Right;  . METATARSAL HEAD EXCISION Right 02/14/2013   Procedure: 4TH METATARSAL HEAD RESECTION RIGHT FOOT;  Surgeon: Dallas Schimke, DPM;  Location: [facility];  Service: Orthopedics;  Laterality: Right;  latex allergy  . TOE AMPUTATION     5th digit right foot-[place]  . VITRECTOMY Right   . WOUND DEBRIDEMENT Right 07/10/2015   Procedure: DEBRIDEMENT WOUND SUBCUTANEOUS TISSUE, MUSCLE, BONE RIGHT FOOT;  Surgeon: [name], DPM;  Location: [facility];  Service: Podiatry;  Laterality: Right;  . WRIST ARTHROSCOPY     left with no break reapir of  tendond and ligamants       Review of Systems    Objective:    There were no vitals taken for this visit. Nursing note and vital signs reviewed.  Physical Exam     Assessment & Plan:   Problem List Items Addressed This Visit    None       I am having Olga Coaster maintain his metoprolol tartrate,  cyclobenzaprine, oxycodone, mometasone, ALPRAZolam, Vilazodone HCl, omega-3 acid ethyl esters, iron polysaccharides, acetaminophen, calcium carbonate, furosemide, sodium bicarbonate, hydrALAZINE, amLODipine, Vitamin D, ranitidine, atorvastatin, apixaban, Insulin Aspart (NOVOLOG FLEXPEN [place]), Insulin Detemir (LEVEMIR FLEXTOUCH [place]), fluticasone, and (potassium chloride 3 mEq/L, magnesium sulfate 1 mEq/L in sodium chloride 0.9 % 3,000 mL).   No orders of the defined types were placed in this encounter.    Follow-up: No follow-ups on file.   Marcos EkeGreg Calone, MSN, [facility]NP[facility]

## 2018-08-27 ENCOUNTER — Encounter: Payer: Self-pay | Admitting: Gastroenterology

## 2018-11-07 ENCOUNTER — Ambulatory Visit: Payer: Medicare Other | Admitting: Gastroenterology

## 2018-11-07 ENCOUNTER — Encounter: Payer: Self-pay | Admitting: Gastroenterology

## 2018-11-07 ENCOUNTER — Encounter

## 2018-12-26 ENCOUNTER — Encounter: Payer: Self-pay | Admitting: Gastroenterology

## 2018-12-26 ENCOUNTER — Telehealth: Payer: Self-pay | Admitting: Gastroenterology

## 2018-12-26 ENCOUNTER — Ambulatory Visit: Payer: Medicare Other | Admitting: Gastroenterology

## 2018-12-26 NOTE — Telephone Encounter (Signed)
PATIENT WAS A NO SHOW AND LETTER SENT  °

## 2019-11-29 DEATH — deceased
